# Patient Record
Sex: Female | Born: 1955
Health system: Southern US, Community
[De-identification: ages and names within clinical notes are randomized; demographics above are authoritative.]

## PROBLEM LIST (undated history)

## (undated) DIAGNOSIS — E7800 Pure hypercholesterolemia, unspecified: Secondary | ICD-10-CM

## (undated) DIAGNOSIS — E039 Hypothyroidism, unspecified: Secondary | ICD-10-CM

## (undated) DIAGNOSIS — G35 Multiple sclerosis: Secondary | ICD-10-CM

## (undated) DIAGNOSIS — E119 Type 2 diabetes mellitus without complications: Secondary | ICD-10-CM

## (undated) DIAGNOSIS — I10 Essential (primary) hypertension: Secondary | ICD-10-CM

## (undated) HISTORY — DX: Morbid (severe) obesity due to excess calories: E66.01

## (undated) HISTORY — DX: Essential (primary) hypertension: I10

## (undated) HISTORY — PX: COLONOSCOPY: SHX174

## (undated) HISTORY — DX: Type 2 diabetes mellitus without complications: E11.9

## (undated) HISTORY — DX: Pure hypercholesterolemia, unspecified: E78.00

## (undated) HISTORY — DX: Multiple sclerosis: G35

---

## 1998-08-26 ENCOUNTER — Other Ambulatory Visit: Admission: RE | Admit: 1998-08-26 | Discharge: 1998-08-26 | Payer: Self-pay | Admitting: Radiology

## 1999-01-11 ENCOUNTER — Encounter: Payer: Self-pay | Admitting: Neurology

## 1999-01-11 ENCOUNTER — Ambulatory Visit (HOSPITAL_COMMUNITY): Admission: RE | Admit: 1999-01-11 | Discharge: 1999-01-11 | Payer: Self-pay | Admitting: Neurology

## 1999-06-20 ENCOUNTER — Encounter: Payer: Self-pay | Admitting: Neurology

## 1999-06-20 ENCOUNTER — Ambulatory Visit (HOSPITAL_COMMUNITY): Admission: RE | Admit: 1999-06-20 | Discharge: 1999-06-20 | Payer: Self-pay | Admitting: Neurology

## 1999-09-13 ENCOUNTER — Emergency Department (HOSPITAL_COMMUNITY): Admission: EM | Admit: 1999-09-13 | Discharge: 1999-09-13 | Payer: Self-pay | Admitting: Emergency Medicine

## 1999-11-21 ENCOUNTER — Other Ambulatory Visit: Admission: RE | Admit: 1999-11-21 | Discharge: 1999-11-21 | Payer: Self-pay | Admitting: Obstetrics and Gynecology

## 2000-11-23 ENCOUNTER — Other Ambulatory Visit: Admission: RE | Admit: 2000-11-23 | Discharge: 2000-11-23 | Payer: Self-pay | Admitting: Obstetrics and Gynecology

## 2001-06-23 ENCOUNTER — Encounter: Admission: RE | Admit: 2001-06-23 | Discharge: 2001-06-23 | Payer: Self-pay | Admitting: Urology

## 2001-06-23 ENCOUNTER — Encounter: Payer: Self-pay | Admitting: Urology

## 2004-12-27 ENCOUNTER — Encounter: Admission: RE | Admit: 2004-12-27 | Discharge: 2004-12-27 | Payer: Self-pay | Admitting: Neurology

## 2005-06-30 ENCOUNTER — Encounter: Admission: RE | Admit: 2005-06-30 | Discharge: 2005-06-30 | Payer: Self-pay | Admitting: Neurology

## 2005-07-22 ENCOUNTER — Encounter: Admission: RE | Admit: 2005-07-22 | Discharge: 2005-10-20 | Payer: Self-pay | Admitting: Family Medicine

## 2005-07-27 ENCOUNTER — Encounter: Admission: RE | Admit: 2005-07-27 | Discharge: 2005-07-27 | Payer: Self-pay | Admitting: Neurology

## 2005-09-23 ENCOUNTER — Encounter: Admission: RE | Admit: 2005-09-23 | Discharge: 2005-09-23 | Payer: Self-pay | Admitting: Family Medicine

## 2005-11-25 ENCOUNTER — Encounter: Admission: RE | Admit: 2005-11-25 | Discharge: 2006-02-23 | Payer: Self-pay | Admitting: Family Medicine

## 2007-08-27 HISTORY — PX: CHOLECYSTECTOMY: SHX55

## 2007-09-06 ENCOUNTER — Inpatient Hospital Stay (HOSPITAL_COMMUNITY): Admission: EM | Admit: 2007-09-06 | Discharge: 2007-09-11 | Payer: Self-pay | Admitting: Emergency Medicine

## 2007-09-07 ENCOUNTER — Encounter (INDEPENDENT_AMBULATORY_CARE_PROVIDER_SITE_OTHER): Payer: Self-pay | Admitting: Internal Medicine

## 2007-09-07 ENCOUNTER — Ambulatory Visit: Payer: Self-pay | Admitting: Vascular Surgery

## 2007-09-09 ENCOUNTER — Encounter (INDEPENDENT_AMBULATORY_CARE_PROVIDER_SITE_OTHER): Payer: Self-pay | Admitting: Surgery

## 2007-09-19 ENCOUNTER — Encounter (HOSPITAL_COMMUNITY): Admission: RE | Admit: 2007-09-19 | Discharge: 2007-10-19 | Payer: Self-pay | Admitting: Internal Medicine

## 2009-08-26 HISTORY — PX: LEFT OOPHORECTOMY: SHX1961

## 2009-09-04 ENCOUNTER — Ambulatory Visit (HOSPITAL_COMMUNITY)
Admission: RE | Admit: 2009-09-04 | Discharge: 2009-09-04 | Payer: Self-pay | Source: Home / Self Care | Admitting: Obstetrics and Gynecology

## 2010-04-11 LAB — BASIC METABOLIC PANEL
BUN: 18 mg/dL (ref 6–23)
CO2: 28 mEq/L (ref 19–32)
Chloride: 104 mEq/L (ref 96–112)
Creatinine, Ser: 0.8 mg/dL (ref 0.4–1.2)

## 2010-04-11 LAB — CBC
MCH: 29 pg (ref 26.0–34.0)
MCHC: 33.3 g/dL (ref 30.0–36.0)
MCV: 87.1 fL (ref 78.0–100.0)
Platelets: 286 10*3/uL (ref 150–400)
RDW: 15.2 % (ref 11.5–15.5)

## 2010-04-11 LAB — GLUCOSE, CAPILLARY

## 2010-06-10 NOTE — Consult Note (Signed)
[name], [name]                 ACCOUNT NO.:  [id number]   MEDICAL RECORD NO.:  [id number]          PATIENT TYPE:  INP   LOCATION:  1441                         FACILITY:  [facility]   PHYSICIAN:  Sandria Bales. Ezzard Standing, M.D.  DATE OF BIRTH:  [birth year]   DATE OF CONSULTATION:  09/08/2007  DATE OF DISCHARGE:                                 CONSULTATION   Date of consultation ??   REFERRING PHYSICIAN:  Corinna L. Lendell [name], MD   REASON FOR CONSULTATION:  Gallstone pancreatitis.   HISTORY OF PRESENT ILLNESS:  This is a 55 year old white female who sees  Dr. Shaune Pollack as her primary care doctor.  She has been having some  trouble with urinary tract infections and was being followed by Dr. Alexis Frock.  Apparently, she has some type of neurogenic bladder  secondary to her multiple sclerosis.  She saw him about 2 weeks ago and  was placed on some antibiotics.  She came to see him on Tuesday, September 06, 2007, when she had an episode of hypotension, she was weak, she  could not sit up and was taken to the [facility] emergency room.   I think the initial question was whether this was a cardiac event.  However, in her evaluation, she was found to have an amylase of 1228,  with elevated liver functions and her admitting diagnosis was  pancreatitis.   She also bumped up her creatinine a little bit.  She was placed on IV  fluids.  Continued on her Macrobid as an antibiotic and trimethoprim.  She rapidly got better.  She is now on her second day of  hospitalization.  Her amylase has returned to normal at 65, her lipase  is 48.  Her alkaline phosphatase has improved to 287, with a normal  bilirubin of 1.0.  She had an ultrasound today that showed a contracted  gallbladder full of gallstones, and I was consulted for consideration of  cholecystectomy.   [name] has no prior history of peptic ulcer disease, liver disease.  She had no known gallbladder disease before this.  She has had no  pancreatic disease.  She has had no colon disease.  She has no abdominal  surgery, but she has a scar below her umbilicus for what cause she does  not know.   ALLERGIES:  PENICILLIN.   CURRENT MEDICATIONS:  1. Baclofen 10 mg 2 in the morning and 1 at night.  2. Betaseron 1 mg subcutaneous every other day.  3. Interferon 1B.  4. Flomax 0.4 mg daily.  5. Vesicare 10 mg daily.  6. Imipramine 50 mg at night.  7. Lipitor 40 mg nightly.  8. Metformin 2 gm at dinner.  9. Metoprolol 12.5 mg b.i.d.  10.Micardis 20 mg daily.  11.Nitrofurantoin.   REVIEW OF SYSTEMS:  NEUROLOGIC:  She has been diagnosed with multiple  sclerosis for about 15 years and is followed by Dr. Gustavus Messing.  [place].  She was doing well until recently when apparently she had to  quit her job because of her progressive multiple sclerosis.  She does  have to use a cane to walk, though actually while she has been in the  hospital she has not been doing much walking at all.  I did speak to Dr.  Orlin Hilding about her.  PULMONARY:  No history of pneumonia or tuberculosis.  Does not smoke  cigarettes.  CARDIAC:  She has been hypertensive for maybe for 4-5 years.  She is on  two antihypertensive medicines.  She has no cardiac catheterization, no  cardiac event.  She has had no catheterization.  GASTROINTESTINAL:  See  history of present illness.  UROLOGIC:  She has had trouble with her bladder for at least 10 years,  mainly incontinence with her bladder related to multiple sclerosis,  followed by Dr. Alexis Frock every 6 months.  GYN:  She has no children.   She is not married, lives by herself.  Gardner Candle, her sister is one of  the PA's who works for radiology.  She has some insurance issues where  her Jeannette Corpus runs out in the end of September.  She will have 2 months  before she can then be I think on Medicare coverage.   PHYSICAL EXAMINATION:  VITAL SIGNS:  Her temperature is 98.0, pulse 72,  respirations 24, blood  pressure 132/74.  HEENT:  Unremarkable.  NECK:  Supple.  I feel no mass, no thyromegaly.  LUNGS:  Clear to auscultation with symmetric breath sounds.  HEART:  Regular rate and rhythm without murmur or rub.  BREASTS:  I did not examine her breasts.  ABDOMEN:  Her abdomen is soft.  She is moderately obese.  She has a scar  below her umbilicus, but she has no tenderness, no guarding, no rebound.  But she says she does have some neurologic changes from her MS, though  she is not sure how well that projects as far as pain.  EXTREMITIES:  She moves all extremities, though again she is weak and  needs assistance I guess to ambulate.   LABORATORY DATA:  That I have; again she had a glucose of 102.  Her  sodium is 141, potassium 4.0, chloride of 112, CO2 of 23, creatinine is  0.77 with a BUN of 7.  Her alkaline phosphatase is 287.  SGOT 62, SGPT  184, total protein 5.8, albumin 2.8, amylase is 65, lipase is 48.  Triglycerides were 77.  Her hemoglobin is 10.1, hematocrit 30.7, white  blood count of 5000.   IMPRESSION:  1. Gallstone pancreatitis which has resolved.  I have spoken to her      about the options for treatment, but I think she would be best      served with surgery.  I am off tomorrow, Dr. Gerrit Friends, I think, is on-      call for our service tomorrow and I will speak to him.  If he can      do her surgery tomorrow that would be great.  If he cannot, then I      can schedule her to be done in 2-4 weeks, and certainly I think      will cover the time line for her [employer] coverage.   I discussed with her the indications and potential complications of  gallbladder surgery.  Potential complications include, but not limited  to bleeding, infection, the possibility of open surgery, possibility of  common bile duct injury.  I also discussed that she probably did pass a  stone down her common bile duct.  If she  had her stones in her common  bile duct, there would be a chance she would need a  second procedure  such as an ERCP to clear her common bile duct.  I think she understands  both the possibility of surgery either tomorrow or on a more delayed  basis and the risks of this surgery.   1. Multiple sclerosis x15 years, followed by Dr. Gustavus Messing. We[place]ann.      I spoke with [name] by phone and she thought there were no      issues from a multiple sclerosis standpoint that would preclude her      surgery.  2. History of urinary tract infection.  Her urine culture on September 07, 2007, looks like it is still pending at this time.  3. Diabetes mellitus, stable.  4. Neurogenic bladder, followed by Dr. Alexis Frock.  5. Hypertension, stable.  6. Hypercholesterolemia.  7. She had a mildly elevated creatinine when she first came in, this      has now returned to normal.      Sandria Bales. Ezzard Standing, M.D.  Electronically Signed     DHN/MEDQ  D:  09/08/2007  T:  09/08/2007  Job:  [id number]   cc:   Duncan Dull, M.D.  Fax: [phone]   Corinna L. Lendell [name], MD   Gustavus Messing Orlin Hilding, M.D.  Fax: [phone]   Sigmund I. Patsi Sears, M.D.  Fax: [phone]

## 2010-06-10 NOTE — Discharge Summary (Signed)
[name], [name]                 ACCOUNT NO.:  [id number]   MEDICAL RECORD NO.:  [id number]          PATIENT TYPE:  INP   LOCATION:  1441                         [facility]ACILITY:  [facility]   PHYSICIAN:  Corinna L. Lendell [name], [name]:  [birth year]   DATE OF ADMISSION:  09/06/2007  DATE OF DISCHARGE:  09/11/2007                               DISCHARGE SUMMARY   DISCHARGE DIAGNOSES:  1. Hypotension.  2. Pancreatitis, most likely biliary, resolved.  3. Diabetes.  4. Acute renal insufficiency.  5. Elevated liver function tests, most likely secondary to a passed      stone, improved.  6. Multiple sclerosis.  7. Recurrent urinary tract infections.  8. Cholelithiasis.  9. Status post laparoscopic cholecystectomy on September 09, 2007 by Dr.      Gerrit Friends.  10.Deconditioning.   DISCHARGE MEDICATIONS:  1. Percocet 1 to 2 p.o. q.4 hours p.r.n. pain.  2. Phenergan 25 mg p.o. q.6 hours p.r.n. nausea.   She may resume her outpatient medications which include:  1. Provigil 200 mg daily if needed.  2. Betaseron injection every other day.  3. Trimethoprim sulfamethoxazole 100 mg every other day.  4. Toprol XL 12.5 mg p.o. daily.  5. Actos 15 mg a day.  6. Vesicare 10 mg a day.  7. Flomax 0.4 mg a day.  8. Amaryl 4 mg a day.  9. Micardis 20 mg a day.  10.Metformin ER 1000 mg daily.  11.Baclofen 10 mg 2 in the morning, 1 in the evening.  12.Imipramine 25 mg nightly.  13.Lipitor 40 mg a day.  14.Nitrofurantoin 100 mg p.o. every other day.   CONDITION:  Stable.   ACTIVITY:  No heavy lifting for 3 weeks and walk with walker.  She is  being referred to outpatient physical therapy 3 times a week.   DIET:  Should be low salt diabetic.   PROCEDURES:  As above.   CONSULTATIONS:  Dr. Ovidio Kin.   LABS:  CBC on admission, normal.  At discharge her hemoglobin is 9.9.  PT/PTT unremarkable.  Complete metabolic panel on admission significant  for a glucose 146, creatinine of 1.66, alkaline  phosphatase of 384, SGOT  of 251, SGPT of 427, total bilirubin of 2.2, direct bilirubin of 1.2.  At discharge her creatinine is 0.77, alkaline phosphatase 243, SGOT 34,  SGPT 123, albumin 2.6.  Lipase on admission was 1288.  Two days later  amylase and lipase were normal.  Serial cardiac enzymes negative.  Blood  cultures negative.  Urine culture grew out E. coli, which was sensitive  to ceftriaxone.  The patient had been getting ceftriaxone as an  outpatient.  Triglycerides 77.   SPECIAL STUDIES/RADIOLOGY:  Carotid Dopplers showed no significant  plaque.  Chest x-ray showed nothing acute.  Ultrasound of the abdomen  showed a contracted stone-filled gallbladder, normal-size common bile  duct and no tenderness to transducer palpation.  Slightly prominent  spleen.  Echocardiogram showed ejection fraction of 70 to 75%, a mildly  dilated left atrium, mildly dilated right atrium.  EKG showed normal  sinus rhythm.   HISTORY AND HOSPITAL  COURSE:  [name] is a pleasant 55 year old white  female with multiple medical problems who was being seen in her  urologist's office when she had presyncope and hypotension.  Her blood  pressure was reportedly in the 60s.  When she was seen in the emergency  room her blood pressure was 76/40 and responded well to IV fluids.  She  had no chest pain, no shortness of breath.  She had been getting, I  believe, ceftriaxone injections at Dr. Imelda Pillow office for urinary  tract infection.  She has a history of hypertension and is on  antihypertensives.  She initially vomited but had really only complaints  of a few days' worth of gas pain prior to this.  She had a nontender  abdomen, initially, but upon recheck had some mild epigastric  tenderness.  The patient was placed in step down.  Her anti-  hypertensives were held.  She was started on IV fluids and empiric  Rocephin.  This was later stopped.  Her lipase came back abnormal and an  ultrasound showed no  obstructing stone but cholelithiasis.  The patient  had no further vomiting preoperatively.  I suspect she had her retained  stone, which has passed.  Her lipase quickly improved to normal.  Surgery was consulted and performed laparoscopic cholecystectomy.  There  was some question about possibly tiny retained stone in the common duct  on intraoperative cholangiogram.  However, Dr. Gerrit Friends feels that this  most likely is not a stone and even if it is, it is small enough to  pass spontaneously.  He did not recommend ERCP.  At the time of  discharge, the patient's labs were stable and she has no worsening pain  or vomiting.  She has been tolerating a solid diet.  Dr. Gerrit Friends  recommends repeat LFTs.  Total time on the day of discharge is 40  minutes.      Corinna L. Lendell [name], MD  Electronically Signed     CLS/MEDQ  D:  09/11/2007  T:  09/11/2007  Job:  [id number]   cc:   Duncan Dull, M.D.  Fax: [phone]   Gustavus Messing. Orlin Hilding, M.D.  Fax: [phone]   Sigmund I. Patsi Sears, M.D.  Fax: [phone]   Velora Heckler, MD  [phone] [address] [place]  [place] [address]

## 2010-06-10 NOTE — H&P (Signed)
[name], [name]                 ACCOUNT NO.:  [id number]   MEDICAL RECORD NO.:  [id number]          PATIENT TYPE:  INP   LOCATION:  1441                         FACILITY:  [facility]   PHYSICIAN:  Kela Millin, M.D.DATE OF BIRTH:  [birth year]   DATE OF ADMISSION:  09/06/2007  DATE OF DISCHARGE:                              HISTORY & PHYSICAL   PRIMARY CARE PHYSICIAN:  Dr. Shaune Pollack   CHIEF COMPLAINT:  Presyncope.   HISTORY OF PRESENT ILLNESS:  The patient is a 54 year old white female  with past medical history significant for hypertension, multiple  sclerosis, diabetes mellitus and history of recurrent urinary tract  infections, as well as hypercholesterolemia who presents with the above  complaints.  She reports that she had just completed antibiotic shots  that she was taking at Dr. Imelda Pillow office for a urinary tract  infection this past week and was coming for a follow-up appointment with  him today.  On her way, she felt sick, nauseous and vomited x1.  When  [facility] got [facility], she felt very weak and  decided that she could not make it by herself to the office and so she  called up there and asked for a wheelchair.  She was then assisted up to  Dr. Imelda Pillow office.  As she was trying to get herself up to the  examining table, she felt faint as if she was going to pass out, but no  loss of consciousness.  She reports that the nurse could not get her  blood pressure, per ER physician, it was reported that her systolic  blood pressure was in the 60s, and they decided to bring her to the ER.  She denies chest pain, cough, fevers, dysuria, melena, hematochezia,  palpitations, shortness of breath and no PND.   She was seen in the ER and her initial blood pressure 80/56 reached a  low of 76/40.  She was bolused with IV fluids and the blood pressure  improved to 112/70.  EKG revealed normal sinus rhythm with Q-waves in  lead III, and her  hemoglobin 12.6, creatinine of 1.66 and BUN of 18.  Chest x-ray revealed no active disease.  A urinalysis done while she was  at Dr. Imelda Pillow office per her report was within normal limits.  She  is admitted for further evaluation and management.   PAST MEDICAL HISTORY:  As above.   MEDICATIONS:  1. Baclofen 10 mg 2 in the morning and 1 in p.m.  2. Betaseron 1 mg subcu every other day.  3. Flomax 0.4 mg daily.  4. Imipramine 25 mg 2 p.o. nightly.  5. Lipitor 40 mg nightly.  6. Metformin 500 mg 4 tablets at dinner.  7. Metoprolol 25 mg 1/2 tablet b.i.d.  8. Micardis 20 mg 1 daily.  9. Nitrofurantoin Macrocrystal 100 mg every other day alternating with      trimethoprim 100 mg every other day.  10.Vesicare 10 mg daily.   ALLERGIES:  PENICILLIN.   SOCIAL HISTORY:  She denies tobacco.  She also denies alcohol.  FAMILY HISTORY:  Her father had a MI at age 30 and died at age 71 of  brain aneurysm.  Her cousin has multiple sclerosis.   REVIEW OF SYSTEMS:  As per HPI, other review of systems negative.   PHYSICAL EXAMINATION:  GENERAL:  The patient is a pleasant middle aged  white female.  She is alert and appropriate in no apparent distress.  VITAL SIGNS:  Temperature is 97.7.  Her blood pressure is 112/70 from a  low of 76/40, pulse 71.  O2 sat is 99%.  Respiratory rate is 18.  HEENT:  PERRL, EOMI, slightly dry mucous membranes.  No oral exudates.  NECK:  Supple, no adenopathy and no thyromegaly.  LUNGS:  Clear to auscultation bilaterally.  No crackles or wheezes.  CARDIOVASCULAR:  Regular rate and rhythm.  Normal S1-S2.  ABDOMEN:  Soft, bowel sounds present, nontender, nondistended.  No  organomegaly and no masses palpable.  EXTREMITIES:  No cyanosis and no edema.  NEURO:  She is alert and oriented x3.  Cranial nerves II-XII grossly  intact.  Nonfocal exam.   LABORATORY DATA:  Her white cell count is 10.2 with a hemoglobin of  12.6, hematocrit of 38.6, platelet count of  201,  neutrophil count 87%.  Sodium is 142 with a potassium of 4.4, chloride 106, CO2 of 27, glucose  146, BUN 18, creatinine 1.66, calcium of 9.7, albumin is 3.3, AST is  251.  Chest x-ray shows no active disease.  INR is 1.0, PTT 27.   ASSESSMENT AND PLAN:  1. Hypotension - responding to IV fluids, will continue hydration,      hold all antihypertensives.  We will obtain blood and urine      cultures, cardiac enzymes, follow and recheck hemoglobin.  As      discussed above, she had an episode of nausea and vomiting.  Will      obtain serum lipase and urinalysis as already mentioned.  Follow      and further manage as appropriate.  2. Presyncope - likely secondary to #1, obtain cardiac enzymes, also      carotid Doppler, ultrasound and 2-D echo.  3. Diabetes.  Monitor Accu-Cheks, cover with sliding scale.  Hold      metformin for now.  4. Acute renal failure - likely secondary to #1.  Hydrate, follow and      recheck.  5. Elevated liver function tests - ? shock liver, obtain hepatitis      panel.  6. Multiple sclerosis - continue outpatient medications.      Kela Millin, M.D.  Electronically Signed     ACV/MEDQ  D:  09/07/2007  T:  09/07/2007  Job:  [id number]   cc:   Duncan Dull, M.D.  Fax: [phone]   Sigmund I. Patsi Sears, M.D.  Fax: [phone]

## 2010-06-10 NOTE — Op Note (Signed)
[name], [name]                 ACCOUNT NO.:  [id number]   MEDICAL RECORD NO.:  [id number]          PATIENT TYPE:  INP   LOCATION:  1441                         FACILITY:  [facility]   PHYSICIAN:  Velora Heckler, MD      DATE OF BIRTH:  06-27-1955   DATE OF PROCEDURE:  09/09/2007  DATE OF DISCHARGE:                               OPERATIVE REPORT   PREOPERATIVE DIAGNOSES:  1. Biliary pancreatitis.  2. Cholelithiasis.   POSTOPERATIVE DIAGNOSIS:  1. Biliary pancreatitis.  2. Cholelithiasis.  3. Chronic cholecystitis.   PROCEDURE:  Laparoscopic cholecystectomy with intraoperative  cholangiography.   SURGEON:  Velora Heckler, M.D., FACS   ASSISTANT:  Verline Lema, M.D., FACS   ANESTHESIA:  General per Dr. Sherrian Divers, M.D.   ESTIMATED BLOOD LOSS:  Minimal.   PREPARATION:  Betadine.   COMPLICATIONS:  None.   INDICATIONS:  The patient is a 55 year old white female admitted on the  Medical service with abdominal pain.  Workup revealed findings of  biliary pancreatitis.  Laboratory studies improved as the patient also  improved clinically.  Ultrasound demonstrated multiple gallstones.  General Surgery was called to evaluate the patient was seen in  consultation by Dr. Ovidio Kin on September 08, 2007.  The patient  continued to remain clinically stable and was prepared brought to the  operating room at this time on September 09, 2007 for cholecystectomy and  intraoperative cholangiography.   DESCRIPTION OF PROCEDURE:  Procedure is done in OR #11 at [facility].  The patient is brought to the operating room and  placed in supine position on the operating room table.  Following  administration of general anesthesia, the patient is positioned and then  prepped and draped in the usual strict aseptic fashion.  After  ascertaining that an adequate level of anesthesia had been achieved, an  infraumbilical incision is made at the midline with a #15 blade.  Dissection   is carried through subcutaneous tissues to the fascia.  The  fascia is then incised in the midline and the peritoneal cavity is  entered cautiously.  Zero Vicryl pursestring suture is placed in the  fascia.  A Hasson cannula is introduced under direct vision and secured  with a pursestring suture.  The abdomen  is insufflated of carbon  dioxide.  The laparoscope is introduced and the abdomen explored.  Operative ports are placed along the right costal margin in the midline,  midclavicular line, and anterior axillary line.  Fundus of the  gallbladder is grasped with some difficulty as the gallbladder is  exceedingly thick-walled and essentially the entire gallbladder lumen is  filled with stones.  The gallbladder is grasped and retracted cephalad.  Adhesions between the omentum and the duodenum to the gallbladder are  taken down gently and hemostasis obtained with the electrocautery.  Dissection is carried down to the neck of the gallbladder.  The neck of  the gallbladder is markedly shortened into the porta hepatis  with some  difficulty.  This is gently dissected out.  The cystic duct is dissected  out  and isolated at its junction with the gallbladder wall.  A clip was  placed across the neck of the gallbladder.  The cystic duct is incised  and clear yellow bile emanates from the cystic duct.  A Cook  cholangiography catheter is inserted through a stab wound in the right  upper quadrant.  It is advanced into the cystic duct and secured with a  ligature clip.  Using C-arm fluoroscopy, real-time cholangiography is  performed.  There is rapid filling of the normal caliber biliary tree.  There is reflux of contrast into the right and left hepatic ductal  systems.  There is free flow of contrast distally into the duodenum.  There did not appear to be any obvious mobile filling defects or  evidence of obstruction on intraoperative cholangiography.  This will be  over read by the  radiologist.   The clip is withdrawn and Cook catheter is removed from the peritoneal  cavity.  The cystic duct is triply clipped and divided.  The gallbladder  is then mobilized gently with blunt dissection.  The branches of the  cystic artery are identified, clipped with ligature clips, and divided  with the electrocautery.  With great difficulty, the neck and body of  the gallbladder are excised from the gallbladder bed.  There is  essentially no mesentery.  The gallbladder is exceedingly thick-walled  and almost rigid.  However, it is finally manipulated free from the  gallbladder bed and hemostasis obtained with the electrocautery.  The  entire gallbladder is excised.  It was placed into an EndoCatch bag and  withdrawn through the umbilical port after widening the fascial  incision.  Upon opening, the gallbladder is filled with small stones.   The right upper quadrant is copiously irrigated with warm saline.  Good  hemostasis is noted throughout.  Fluid is evacuated.  Pneumoperitoneum  is released and ports are removed under direct vision.  Zero Vicryl  pursestring sutures are tied securely.  The port sites are anesthetized  with local anesthetic.  The wounds are closed with interrupted 4-0  Vicryl subcuticular sutures.  The wounds are washed and dried and  Benzoin Steri-Strips are applied.  Sterile dressings are applied.  The  patient is awakened from anesthesia and brought to the recovery room in  stable condition.  The patient tolerated the procedure well.      Velora Heckler, MD  Electronically Signed     TMG/MEDQ  D:  09/09/2007  T:  09/09/2007  Job:  [id number]   cc:   Duncan Dull, M.D.  Fax: [phone]   Sigmund I. Patsi Sears, M.D.  Fax: [phone]   Gustavus Messing. Orlin Hilding, M.D.  Fax: [phone]   Lendell [name], MD Cammie Mcgee

## 2010-07-14 ENCOUNTER — Other Ambulatory Visit: Payer: Self-pay | Admitting: Obstetrics and Gynecology

## 2010-10-24 LAB — DIFFERENTIAL
Basophils Absolute: 0
Basophils Relative: 0
Basophils Relative: 1
Eosinophils Absolute: 0
Eosinophils Absolute: 0.2
Monocytes Absolute: 0.4
Monocytes Absolute: 0.7
Monocytes Relative: 8
Neutro Abs: 3
Neutrophils Relative %: 60
Neutrophils Relative %: 87 — ABNORMAL HIGH

## 2010-10-24 LAB — CULTURE, BLOOD (ROUTINE X 2)
Culture: NO GROWTH
Culture: NO GROWTH

## 2010-10-24 LAB — COMPREHENSIVE METABOLIC PANEL
ALT: 123 — ABNORMAL HIGH
ALT: 184 — ABNORMAL HIGH
ALT: 265 — ABNORMAL HIGH
Albumin: 2.6 — ABNORMAL LOW
Albumin: 3.3 — ABNORMAL LOW
Alkaline Phosphatase: 287 — ABNORMAL HIGH
Alkaline Phosphatase: 303 — ABNORMAL HIGH
Alkaline Phosphatase: 384 — ABNORMAL HIGH
BUN: 14
BUN: 18
BUN: 7
CO2: 23
Calcium: 8.3 — ABNORMAL LOW
Chloride: 106
Chloride: 113 — ABNORMAL HIGH
GFR calc Af Amer: >60
GFR calc non Af Amer: >60
Glucose, Bld: 120 — ABNORMAL HIGH
Glucose, Bld: 146 — ABNORMAL HIGH
Glucose, Bld: 162 — ABNORMAL HIGH
Glucose, Bld: 187 — ABNORMAL HIGH
Potassium: 3.8
Potassium: 4
Potassium: 4.4
Sodium: 139
Sodium: 141
Sodium: 141
Total Bilirubin: 1
Total Bilirubin: 2.6 — ABNORMAL HIGH
Total Bilirubin: 4.8 — ABNORMAL HIGH
Total Protein: 5.5 — ABNORMAL LOW
Total Protein: 5.7 — ABNORMAL LOW

## 2010-10-24 LAB — CBC
HCT: 38.6
Hemoglobin: 12.6
Platelets: 201
Platelets: 209
RBC: 3.44 — ABNORMAL LOW
WBC: 10.2
WBC: 5

## 2010-10-24 LAB — GLUCOSE, CAPILLARY
Glucose-Capillary: 130 — ABNORMAL HIGH
Glucose-Capillary: 141 — ABNORMAL HIGH
Glucose-Capillary: 148 — ABNORMAL HIGH
Glucose-Capillary: 156 — ABNORMAL HIGH

## 2010-10-24 LAB — URINE CULTURE

## 2010-10-24 LAB — HEPATIC FUNCTION PANEL
ALT: 328 — ABNORMAL HIGH
AST: 182 — ABNORMAL HIGH
Alkaline Phosphatase: 344 — ABNORMAL HIGH
Bilirubin, Direct: 2.2 — ABNORMAL HIGH
Total Bilirubin: 3.4 — ABNORMAL HIGH

## 2010-10-24 LAB — PROTIME-INR: INR: 1

## 2010-10-24 LAB — HEMOGLOBIN AND HEMATOCRIT, BLOOD
HCT: 30.6 — ABNORMAL LOW
Hemoglobin: 10.1 — ABNORMAL LOW

## 2010-10-24 LAB — CARDIAC PANEL(CRET KIN+CKTOT+MB+TROPI)
Relative Index: INVALID
Relative Index: INVALID
Total CK: 47
Troponin I: 0.01

## 2010-10-24 LAB — CK TOTAL AND CKMB (NOT AT ARMC): Total CK: 41

## 2011-02-11 DIAGNOSIS — G35 Multiple sclerosis: Secondary | ICD-10-CM | POA: Diagnosis not present

## 2011-02-11 DIAGNOSIS — R269 Unspecified abnormalities of gait and mobility: Secondary | ICD-10-CM | POA: Diagnosis not present

## 2011-03-30 DIAGNOSIS — I10 Essential (primary) hypertension: Secondary | ICD-10-CM | POA: Diagnosis not present

## 2011-03-30 DIAGNOSIS — E7800 Pure hypercholesterolemia, unspecified: Secondary | ICD-10-CM | POA: Diagnosis not present

## 2011-03-30 DIAGNOSIS — R197 Diarrhea, unspecified: Secondary | ICD-10-CM | POA: Diagnosis not present

## 2011-06-29 DIAGNOSIS — I10 Essential (primary) hypertension: Secondary | ICD-10-CM | POA: Diagnosis not present

## 2011-06-29 DIAGNOSIS — E7800 Pure hypercholesterolemia, unspecified: Secondary | ICD-10-CM | POA: Diagnosis not present

## 2011-07-01 DIAGNOSIS — H5210 Myopia, unspecified eye: Secondary | ICD-10-CM | POA: Diagnosis not present

## 2011-07-01 DIAGNOSIS — G35 Multiple sclerosis: Secondary | ICD-10-CM | POA: Diagnosis not present

## 2011-07-01 DIAGNOSIS — H52229 Regular astigmatism, unspecified eye: Secondary | ICD-10-CM | POA: Diagnosis not present

## 2011-07-01 DIAGNOSIS — H4720 Unspecified optic atrophy: Secondary | ICD-10-CM | POA: Diagnosis not present

## 2011-07-13 DIAGNOSIS — N3000 Acute cystitis without hematuria: Secondary | ICD-10-CM | POA: Diagnosis not present

## 2011-07-13 DIAGNOSIS — N3020 Other chronic cystitis without hematuria: Secondary | ICD-10-CM | POA: Diagnosis not present

## 2011-07-13 DIAGNOSIS — N312 Flaccid neuropathic bladder, not elsewhere classified: Secondary | ICD-10-CM | POA: Diagnosis not present

## 2011-08-20 DIAGNOSIS — G35 Multiple sclerosis: Secondary | ICD-10-CM | POA: Diagnosis not present

## 2011-08-20 DIAGNOSIS — R269 Unspecified abnormalities of gait and mobility: Secondary | ICD-10-CM | POA: Diagnosis not present

## 2011-09-07 DIAGNOSIS — G35 Multiple sclerosis: Secondary | ICD-10-CM | POA: Diagnosis not present

## 2011-09-07 DIAGNOSIS — R269 Unspecified abnormalities of gait and mobility: Secondary | ICD-10-CM | POA: Diagnosis not present

## 2011-10-01 DIAGNOSIS — G35 Multiple sclerosis: Secondary | ICD-10-CM | POA: Diagnosis not present

## 2011-10-01 DIAGNOSIS — E782 Mixed hyperlipidemia: Secondary | ICD-10-CM | POA: Diagnosis not present

## 2011-10-01 DIAGNOSIS — I10 Essential (primary) hypertension: Secondary | ICD-10-CM | POA: Diagnosis not present

## 2011-10-01 DIAGNOSIS — Z23 Encounter for immunization: Secondary | ICD-10-CM | POA: Diagnosis not present

## 2011-10-07 DIAGNOSIS — R269 Unspecified abnormalities of gait and mobility: Secondary | ICD-10-CM | POA: Diagnosis not present

## 2011-10-07 DIAGNOSIS — G35 Multiple sclerosis: Secondary | ICD-10-CM | POA: Diagnosis not present

## 2011-12-31 DIAGNOSIS — E782 Mixed hyperlipidemia: Secondary | ICD-10-CM | POA: Diagnosis not present

## 2011-12-31 DIAGNOSIS — I10 Essential (primary) hypertension: Secondary | ICD-10-CM | POA: Diagnosis not present

## 2012-01-04 DIAGNOSIS — G35 Multiple sclerosis: Secondary | ICD-10-CM | POA: Diagnosis not present

## 2012-01-04 DIAGNOSIS — R339 Retention of urine, unspecified: Secondary | ICD-10-CM | POA: Diagnosis not present

## 2012-01-04 DIAGNOSIS — N312 Flaccid neuropathic bladder, not elsewhere classified: Secondary | ICD-10-CM | POA: Diagnosis not present

## 2012-01-04 DIAGNOSIS — N3941 Urge incontinence: Secondary | ICD-10-CM | POA: Diagnosis not present

## 2012-01-09 ENCOUNTER — Encounter: Payer: Self-pay | Admitting: Neurology

## 2012-01-09 DIAGNOSIS — G35 Multiple sclerosis: Secondary | ICD-10-CM

## 2012-01-09 DIAGNOSIS — R269 Unspecified abnormalities of gait and mobility: Secondary | ICD-10-CM

## 2012-01-11 DIAGNOSIS — R310 Gross hematuria: Secondary | ICD-10-CM | POA: Diagnosis not present

## 2012-01-11 DIAGNOSIS — G35 Multiple sclerosis: Secondary | ICD-10-CM | POA: Diagnosis not present

## 2012-01-11 DIAGNOSIS — N3020 Other chronic cystitis without hematuria: Secondary | ICD-10-CM | POA: Diagnosis not present

## 2012-01-11 DIAGNOSIS — N312 Flaccid neuropathic bladder, not elsewhere classified: Secondary | ICD-10-CM | POA: Diagnosis not present

## 2012-01-14 DIAGNOSIS — G35 Multiple sclerosis: Secondary | ICD-10-CM | POA: Diagnosis not present

## 2012-01-14 DIAGNOSIS — R269 Unspecified abnormalities of gait and mobility: Secondary | ICD-10-CM | POA: Diagnosis not present

## 2012-01-29 ENCOUNTER — Ambulatory Visit (HOSPITAL_COMMUNITY)
Admission: RE | Admit: 2012-01-29 | Discharge: 2012-01-29 | Disposition: A | Discharge status: Home / Self Care | Payer: Medicare Other | Source: Ambulatory Visit | Attending: Neurology | Admitting: Neurology

## 2012-01-29 DIAGNOSIS — IMO0001 Reserved for inherently not codable concepts without codable children: Secondary | ICD-10-CM | POA: Insufficient documentation

## 2012-01-29 DIAGNOSIS — Z9181 History of falling: Secondary | ICD-10-CM | POA: Diagnosis not present

## 2012-01-29 DIAGNOSIS — R269 Unspecified abnormalities of gait and mobility: Secondary | ICD-10-CM | POA: Insufficient documentation

## 2012-01-29 DIAGNOSIS — M6281 Muscle weakness (generalized): Secondary | ICD-10-CM | POA: Diagnosis not present

## 2012-01-29 DIAGNOSIS — R262 Difficulty in walking, not elsewhere classified: Secondary | ICD-10-CM | POA: Insufficient documentation

## 2012-01-29 DIAGNOSIS — R29898 Other symptoms and signs involving the musculoskeletal system: Secondary | ICD-10-CM | POA: Insufficient documentation

## 2012-01-29 NOTE — Evaluation (Signed)
Physical Therapy Evaluation  Patient Details  Name: [name] MRN: [id number] Date of Birth: September 05, 1955  Today's Date: 01/29/2012 Time: 1025-1108 PT Time Calculation (min): 43 min  Visit#: 1  of 12   Re-eval: 02/28/12 Assessment Diagnosis: hx of falling Next MD Visit: 03/04/2012  Authorization: medicare  Authorization Time Period:    Authorization Visit#: 1  of 19    Past Medical History:  Past Medical History  Diagnosis Date  . Diabetes mellitus without complication   . Hypertension   . Morbid obesity   . High cholesterol   . MS (multiple sclerosis) relapsing remitting   Past Surgical History:  Past Surgical History  Procedure Date  . Cholecystectomy   . Left oophorectomy     Subjective Symptoms/Limitations Symptoms: Ms. Jezewski has been diagnosed with MS for 15 yrs.  She has been using a rolling walker for about two months now due to being weaker.  She had used a cane in the past for about five years.  She has started to fall and therefore she spoke to her MD who has referred her to therapy for evealuation and treatment.  How long can you sit comfortably?: no problem  How long can you stand comfortably?: Able to stand for 10 minutes. How long can you walk comfortably?: Able to walk with her walker for about five minutes. Pain Assessment Currently in Pain?:  (Pt states that her legs it is more numbness)  Precautions/Restrictions     Prior Functioning  Home Living Lives With: Alone Type of Home: Apartment  Cognition/Observation Cognition Overall Cognitive Status: Appears within functional limits for tasks assessed  Sensation/Coordination/Flexibility/Functional Tests Functional Tests Functional Tests: sit to stand no hands 6/ 30 sec.  Assessment RLE Strength Right Hip Flexion: 2+/5 Right Hip Extension: 3+/5 Right Hip ABduction: 2/5 Right Knee Flexion: 3/5 Right Knee Extension: 3/5 Right Ankle Dorsiflexion: 3+/5 LLE Strength Left Hip Flexion:  3-/5 Left Hip Extension: 3/5 Left Hip ABduction: 2+/5 Left Knee Flexion: 3+/5 Left Knee Extension: 3+/5 Left Ankle Dorsiflexion: 3+/5  Exercise/Treatments Mobility/Balance  Berg Balance Test Sit to Stand: Able to stand without using hands and stabilize independently Standing Unsupported: Able to stand 2 minutes with supervision Sitting with Back Unsupported but Feet Supported on Floor or Stool: Able to sit safely and securely 2 minutes Stand to Sit: Controls descent by using hands Transfers: Able to transfer safely, definite need of hands Standing Unsupported with Eyes Closed: Able to stand 3 seconds Standing Ubsupported with Feet Together: Needs help to attain position but able to stand for 30 seconds with feet together From Standing, Reach Forward with Outstretched Arm: Can reach forward >12 cm safely (5") From Standing Position, Pick up Object from Floor: Able to pick up shoe safely and easily From Standing Position, Turn to Look Behind Over each Shoulder: Turn sideways only but maintains balance Turn 360 Degrees: Able to turn 360 degrees safely but slowly Standing Unsupported, Alternately Place Feet on Step/Stool: Needs assistance to keep from falling or unable to try Standing Unsupported, One Foot in Front: Loses balance while stepping or standing Standing on One Leg: Tries to lift leg/unable to hold 3 seconds but remains standing independently Total Score: 32      Seated Other Seated Exercises: HEP ankle DF/PF/glut sets/bridge/ bent knee raise/hip abduction x10   Physical Therapy Assessment and Plan PT Assessment and Plan Clinical Impression Statement: Pt with history of MS who has been declining in strength and balance resulting in falls who will benefit from  skilled PT to improve safety and maximize pt functioning level. Pt will benefit from skilled therapeutic intervention in order to improve on the following deficits: Abnormal gait;Decreased activity tolerance;Decreased  balance;Difficulty walking;Decreased strength Rehab Potential: Good PT Frequency: Min 3X/week PT Duration: 4 weeks PT Treatment/Interventions: Gait training;Therapeutic activities;Therapeutic exercise;Patient/family education PT Plan: Begin bike,  balance activies; SLS; heel raises; tandem stance, tandem gt; retro gt side stepping; marching in place..    Goals Home Exercise Program Pt will Perform Home Exercise Program: Independently PT Short Term Goals Time to Complete Short Term Goals: 2 weeks PT Short Term Goal 1: Pt strength to be improved 1/2 grade for patient to feel confident going sit to stand without using her hands. PT Long Term Goals Time to Complete Long Term Goals: 4 weeks PT Long Term Goal 1: Pt strength to be increased one grade to allow pt to be able to get up off the floor by herself PT Long Term Goal 2: Pt to report no falls in the past two weeks Long Term Goal 3: Berg score to be improved by 10 points to allow pt to feel comfortable ambulating in the house with a cane.  Problem List Patient Active Problem List  Diagnosis  . Abnormality of gait  . Multiple sclerosis  . History of falling  . Bilateral leg weakness  . Difficulty walking    PT - End of Session Equipment Utilized During Treatment: Gait belt Activity Tolerance: Patient tolerated treatment well General Behavior During Session: [facility] for tasks performed Cognition: [facility] for tasks performed PT Plan of Care PT Home Exercise Plan: given for strengthening.  GP Functional Assessment Tool Used: ABC Functional Limitation: Mobility: Walking and moving around Mobility: Walking and Moving Around Current Status (O1308): At least 40 percent but less than 60 percent impaired, limited or restricted Mobility: Walking and Moving Around Goal Status [phone]): At least 20 percent but less than 40 percent impaired, limited or restricted  RUSSELL,CINDY 01/29/2012, 12:21 PM  Physician Documentation Your signature is  required to indicate approval of the treatment plan as stated above.  Please sign and either send electronically or make a copy of this report for your files and return this physician signed original.   Please mark one 1.__approve of plan  2. ___approve of plan with the following conditions.   ______________________________                                                          _____________________ Physician Signature                                                                                                             Date

## 2012-02-01 ENCOUNTER — Ambulatory Visit (HOSPITAL_COMMUNITY)
Admission: RE | Admit: 2012-02-01 | Discharge: 2012-02-01 | Disposition: A | Discharge status: Home / Self Care | Payer: Medicare Other | Source: Ambulatory Visit | Attending: Neurology | Admitting: Neurology

## 2012-02-01 NOTE — Progress Notes (Signed)
Physical Therapy Treatment Patient Details  Name: [name] MRN: [id number] Date of Birth: 1955-12-24  Today's Date: 02/01/2012 Time: 1520-1600 PT Time Calculation (min): 40 min  Visit#: 2  of 12   Re-eval: 02/28/12 Charges: Gait x 8' NMR x 30'  Authorization: medicare  Authorization Visit#: 2  of 19    Subjective: Symptoms/Limitations Symptoms: Pt reports no pain only weakness.   Exercise/Treatments Balance Exercises Standing Tandem Stance: 2 reps;30 secs;Intermittent HHA Tandem Gait: 1 rep;Forward;Limitations Tandem Gait Limitations: w/1 HHA Sidestepping: 1 rep Heel Raises: 10 reps Toe Raise: 10 reps Other Standing Exercises: Gait training around dept x 8' with vc's for stride length, posture, and hip/knee flexion   Physical Therapy Assessment and Plan PT Assessment and Plan Clinical Impression Statement: Pt requires multiple rest breaks throughout session. Attempted tandem gait but pt was unable to complete activity without 1 HHA. Began gait training with vc's for posture, stride length and hip/knee flexion. Pt reports no increase in pain at end of session. PT Plan: Continue to progress per PT POC. Begin retro gait and SLS next tx.     Problem List Patient Active Problem List  Diagnosis  . Abnormality of gait  . Multiple sclerosis  . History of falling  . Bilateral leg weakness  . Difficulty walking    PT - End of Session Equipment Utilized During Treatment: Gait belt Activity Tolerance: Patient tolerated treatment well General Behavior During Session: [facility] for tasks performed Cognition: [facility] for tasks performed  GP Functional Assessment Tool Used: ABC  Seth Bake, PTA 02/01/2012, 4:31 PM

## 2012-02-03 ENCOUNTER — Ambulatory Visit (HOSPITAL_COMMUNITY)
Admission: RE | Admit: 2012-02-03 | Discharge: 2012-02-03 | Disposition: A | Discharge status: Home / Self Care | Payer: Medicare Other | Source: Ambulatory Visit | Attending: Neurology | Admitting: Neurology

## 2012-02-03 DIAGNOSIS — R269 Unspecified abnormalities of gait and mobility: Secondary | ICD-10-CM | POA: Diagnosis not present

## 2012-02-03 DIAGNOSIS — IMO0001 Reserved for inherently not codable concepts without codable children: Secondary | ICD-10-CM | POA: Diagnosis not present

## 2012-02-03 DIAGNOSIS — M6281 Muscle weakness (generalized): Secondary | ICD-10-CM | POA: Diagnosis not present

## 2012-02-03 DIAGNOSIS — Z9181 History of falling: Secondary | ICD-10-CM | POA: Diagnosis not present

## 2012-02-03 NOTE — Progress Notes (Signed)
Physical Therapy Treatment Patient Details  Name: [name] MRN: [id number] Date of Birth: 08/05/1955  Today's Date: 02/03/2012 Time: 1518-1605 PT Time Calculation (min): 47 min  Visit#: 3  of 12   Re-eval: 02/28/12 Authorization: medicare  Authorization Visit#: 3  of 19   Charges:  NMR 12', gait 8', therex 24'  Subjective: Symptoms/Limitations Symptoms: Pt. states she is doing well; playing phone tag with MD in regards to getting new RW.  No pain. Pain Assessment Currently in Pain?: No/denies   Exercise/Treatments Balance Exercises Standing Tandem Stance: 2 reps;30 secs;Intermittent HHA SLS: Eyes open;Hand held assist (HHA) 1;Solid surface;3 reps Tandem Gait: Forward;Limitations;2 reps Tandem Gait Limitations: w/1 HHA Retro Gait: 1 rep Sidestepping: 1 rep Heel Raises: 15 reps Toe Raise: 15 reps Other Standing Exercises: Gait training around dept 2RT with vc's for stride length, posture, and hip/knee flexion Other Standing Exercises: hip abd, ext 10 reps each  Seated Other Seated Exercises: bike 6'@2 .0, seat 10    Physical Therapy Assessment and Plan PT Assessment and Plan Clinical Impression Statement: Pt. continues to require rest breaks throughout session.  Added standing  hip abduction and extension to increase stabilty.  Pt. unable to maintain SLS without UE assistance.  Sent script to MD for FW walker; to await return to give to patient.  PT Plan: Continue to progress per PT POC. Continue to progress strength, balance and overall stability.     Problem List Patient Active Problem List  Diagnosis  . Abnormality of gait  . Multiple sclerosis  . History of falling  . Bilateral leg weakness  . Difficulty walking    PT - End of Session Equipment Utilized During Treatment: Gait belt Activity Tolerance: Patient tolerated treatment well General Behavior During Session: [facility] for tasks performed Cognition: [facility] for tasks performed   Lurena Nida,  PTA/CLT 02/03/2012, 4:33 PM

## 2012-02-05 ENCOUNTER — Ambulatory Visit (HOSPITAL_COMMUNITY)
Admission: RE | Admit: 2012-02-05 | Discharge: 2012-02-05 | Disposition: A | Discharge status: Home / Self Care | Payer: Medicare Other | Source: Ambulatory Visit | Attending: Neurology | Admitting: Neurology

## 2012-02-05 DIAGNOSIS — R29898 Other symptoms and signs involving the musculoskeletal system: Secondary | ICD-10-CM

## 2012-02-05 DIAGNOSIS — Z9181 History of falling: Secondary | ICD-10-CM | POA: Diagnosis not present

## 2012-02-05 DIAGNOSIS — M6281 Muscle weakness (generalized): Secondary | ICD-10-CM | POA: Diagnosis not present

## 2012-02-05 DIAGNOSIS — R262 Difficulty in walking, not elsewhere classified: Secondary | ICD-10-CM

## 2012-02-05 DIAGNOSIS — IMO0001 Reserved for inherently not codable concepts without codable children: Secondary | ICD-10-CM | POA: Diagnosis not present

## 2012-02-05 DIAGNOSIS — R269 Unspecified abnormalities of gait and mobility: Secondary | ICD-10-CM | POA: Diagnosis not present

## 2012-02-05 NOTE — Progress Notes (Signed)
Physical Therapy Treatment Patient Details  Name: [name] MRN: [id number] Date of Birth: 03/11/1955  Today's Date: 02/05/2012 Time: 1523-1602 PT Time Calculation (min): 39 min Charge: Gt 8; there ex 29 Visit#: 4  of 12   Re-eval: 02/29/12    Authorization: medicare  Authorization Visit#: 4  of 10    Subjective: Symptoms/Limitations Symptoms: Pt states that she is sore.   Exercise/Treatments Balance Exercises Standing Tandem Stance: 2 reps;30 secs;Intermittent HHA SLS: Eyes open;Solid surface;3 reps Sidestepping: 2 reps Toe Raise: 10 reps Other Standing Exercises: Gait training around dept 2RT with vc's for stride length, posture, and hip/knee flexion    Seated Other Seated Exercises: bike 6'@1 .5, seat 10      Physical Therapy Assessment and Plan PT Assessment and Plan Clinical Impression Statement: Pt continues to need constant verbal cuing to ambulate in correct posture.  Pt needs frequent rest breaks throughout treatment.  PT needs verbal cuing to not drag right leg when adducting during side stepping. PT Plan: Add in tandem and retro gait into program next visitl    Goals Home Exercise Program PT Goal: Perform Home Exercise Program - Progress: Met PT Short Term Goals PT Short Term Goal 1 - Progress: Progressing toward goal PT Long Term Goals PT Long Term Goal 1 - Progress: Progressing toward goal PT Long Term Goal 2 - Progress: Progressing toward goal Long Term Goal 3 Progress: Progressing toward goal  Problem List Patient Active Problem List  Diagnosis  . Abnormality of gait  . Multiple sclerosis  . History of falling  . Bilateral leg weakness  . Difficulty walking    PT - End of Session Equipment Utilized During Treatment: Gait belt Activity Tolerance: Treatment limited secondary to medical complications (Comment) (Pt unable to finish whole program secondary to losing control of her bowels.)  GP    [name],[name] 02/05/2012, 4:12 PM

## 2012-02-08 ENCOUNTER — Ambulatory Visit (HOSPITAL_COMMUNITY)
Admission: RE | Admit: 2012-02-08 | Discharge: 2012-02-08 | Disposition: A | Discharge status: Home / Self Care | Payer: Medicare Other | Source: Ambulatory Visit

## 2012-02-08 DIAGNOSIS — Z9181 History of falling: Secondary | ICD-10-CM | POA: Diagnosis not present

## 2012-02-08 DIAGNOSIS — R29898 Other symptoms and signs involving the musculoskeletal system: Secondary | ICD-10-CM

## 2012-02-08 DIAGNOSIS — IMO0001 Reserved for inherently not codable concepts without codable children: Secondary | ICD-10-CM | POA: Diagnosis not present

## 2012-02-08 DIAGNOSIS — M6281 Muscle weakness (generalized): Secondary | ICD-10-CM | POA: Diagnosis not present

## 2012-02-08 DIAGNOSIS — R262 Difficulty in walking, not elsewhere classified: Secondary | ICD-10-CM

## 2012-02-08 DIAGNOSIS — R269 Unspecified abnormalities of gait and mobility: Secondary | ICD-10-CM | POA: Diagnosis not present

## 2012-02-08 NOTE — Progress Notes (Signed)
Physical Therapy Treatment Patient Details  Name: [name] MRN: [id number] Date of Birth: 1955/10/20  Today's Date: 02/08/2012 Time: 1515-1604 PT Time Calculation (min): 49 min  Visit#: 5  of 12   Re-eval: 02/29/12   Charge: there ex x 42' Authorization: medicare  Authorization Visit#: 4  of 10    Subjective: Symptoms/Limitations Symptoms: Pt states she has been doing her execises  Exercise/Treatments  Balance Exercises Standing Tandem Stance: 2 reps;30 secs;Intermittent HHA SLS: Eyes open;Solid surface;3 reps Tandem Gait: Forward;2 reps;Limitations Tandem Gait Limitations: 1 HH Retro Gait: 2 reps Sidestepping: 2 reps Marching: Solid surface;10 reps Heel Raises: 10 reps Toe Raise: 10 reps Sit to Stand: Standard surface;Limitations Sit to Stand Limitations: R LE x 5; L x 5 Other Standing Exercises: hip ab/ex B x 10w/ 3#      Seated Other Seated Exercises: bike 6'@1 .0 w/ B LE tied w/ t-band, seat 10   Physical Therapy Assessment and Plan PT Assessment and Plan Clinical Impression Statement: Pt continues to need verbal cuing to keep COG over BOS; added sit to stand and marching activities.  Tied LE together w/ t-band during bike for better hip control. PT Plan: beging cone rotation on solid surface.  It pt does well may progress to foam next period    Goals Home Exercise Program PT Goal: Perform Home Exercise Program - Progress: Met PT Short Term Goals PT Short Term Goal 1 - Progress: Progressing toward goal PT Long Term Goals PT Long Term Goal 1 - Progress: Progressing toward goal PT Long Term Goal 2 - Progress: Progressing toward goal Long Term Goal 3 Progress: Progressing toward goal  Problem List Patient Active Problem List  Diagnosis  . Abnormality of gait  . Multiple sclerosis  . History of falling  . Bilateral leg weakness  . Difficulty walking    PT - End of Session Equipment Utilized During Treatment: Gait belt Activity Tolerance: Patient  tolerated treatment well General Behavior During Session: [facility] for tasks performed Cognition: [facility] for tasks performed  GP    RUSSELL,CINDY 02/08/2012, 4:18 PM

## 2012-02-10 ENCOUNTER — Ambulatory Visit (HOSPITAL_COMMUNITY)
Admission: RE | Admit: 2012-02-10 | Discharge: 2012-02-10 | Disposition: A | Discharge status: Home / Self Care | Payer: Medicare Other | Source: Ambulatory Visit | Attending: Neurology | Admitting: Neurology

## 2012-02-10 DIAGNOSIS — M6281 Muscle weakness (generalized): Secondary | ICD-10-CM | POA: Diagnosis not present

## 2012-02-10 DIAGNOSIS — R269 Unspecified abnormalities of gait and mobility: Secondary | ICD-10-CM | POA: Diagnosis not present

## 2012-02-10 DIAGNOSIS — IMO0001 Reserved for inherently not codable concepts without codable children: Secondary | ICD-10-CM | POA: Diagnosis not present

## 2012-02-10 DIAGNOSIS — Z9181 History of falling: Secondary | ICD-10-CM | POA: Diagnosis not present

## 2012-02-10 NOTE — Progress Notes (Signed)
Physical Therapy Treatment Patient Details  Name: [name] MRN: [id number] Date of Birth: [birth year]  Today's Date: 02/10/2012 Time: 4098-1191 PT Time Calculation (min): 40 min  Visit#: 6  of 12   Re-eval: 02/29/12 Charges: Therex x 8' NMR x 30'  Authorization: medicare  Authorization Visit#: 5  of 10    Subjective: Symptoms/Limitations Symptoms: Pt states tha she got a new walker. Pain Assessment Currently in Pain?: No/denies   Exercise/Treatments Balance Exercises Standing Tandem Gait: 2 reps Retro Gait: 2 reps Sidestepping: 2 reps Marching: Solid surface;10 reps Heel Raises: 15 reps Toe Raise: 15 reps  Seated Other Seated Exercises: Bike 8'@2 .0 for strengthening and activity tolerance   Physical Therapy Assessment and Plan PT Assessment and Plan Clinical Impression Statement: Pt displays improved hip stability with sidestepping. Pt requires constant cueing to complete true tandem gait. Pt requires frequent rest breaks throughout session. Pt reports 0/10 pain at end of session. PT Plan: Continue per PT POC. Begin cone rotation on solid surface.  It pt does well may progress to foam the following session.     Problem List Patient Active Problem List  Diagnosis  . Abnormality of gait  . Multiple sclerosis  . History of falling  . Bilateral leg weakness  . Difficulty walking    PT - End of Session Equipment Utilized During Treatment: Gait belt Activity Tolerance: Patient tolerated treatment well General Behavior During Session: [facility] for tasks performed Cognition: [facility] for tasks performed  Seth Bake, PTA 02/10/2012, 4:35 PM

## 2012-02-12 ENCOUNTER — Ambulatory Visit (HOSPITAL_COMMUNITY)
Admission: RE | Admit: 2012-02-12 | Discharge: 2012-02-12 | Disposition: A | Discharge status: Home / Self Care | Payer: Medicare Other | Source: Ambulatory Visit | Attending: Neurology | Admitting: Neurology

## 2012-02-12 DIAGNOSIS — M6281 Muscle weakness (generalized): Secondary | ICD-10-CM | POA: Diagnosis not present

## 2012-02-12 DIAGNOSIS — Z9181 History of falling: Secondary | ICD-10-CM | POA: Diagnosis not present

## 2012-02-12 DIAGNOSIS — IMO0001 Reserved for inherently not codable concepts without codable children: Secondary | ICD-10-CM | POA: Diagnosis not present

## 2012-02-12 DIAGNOSIS — R29898 Other symptoms and signs involving the musculoskeletal system: Secondary | ICD-10-CM

## 2012-02-12 DIAGNOSIS — R262 Difficulty in walking, not elsewhere classified: Secondary | ICD-10-CM

## 2012-02-12 DIAGNOSIS — R269 Unspecified abnormalities of gait and mobility: Secondary | ICD-10-CM | POA: Diagnosis not present

## 2012-02-12 NOTE — Progress Notes (Addendum)
Physical Therapy Treatment Patient Details  Name: [name] MRN: [id number] Date of Birth: [birth year]  Today's Date: 02/12/2012 Time: 4098-1191 PT Time Calculation (min): 44 min  Visit#: 7  of 12   Re-eval: 02/29/12  charge:  There ex 41  Authorization: medicare  Authorization Visit#: 6  of 10    Subjective: Symptoms/Limitations Symptoms: Pt states she can tell she is able to walk better.  Pain Assessment Currently in Pain?: No/denies   Exercise/Treatments Balance Exercises Standing Tandem Stance: Eyes open;3 reps SLS: Eyes open;5 reps Tandem Gait: 2 reps Retro Gait: 2 reps Sidestepping: 2 reps Marching: Solid surface;10 reps Toe Raise: 15 reps Sit to Stand: Standard surface Sit to Stand Limitations: x10@ Other Standing Exercises: hip ab/ext x t     Seated Other Seated Exercises: Bike 8'@2 .0 for strengthening and activity tolerance       Physical Therapy Assessment and Plan PT Assessment and Plan Clinical Impression Statement: Pt has less rest breaks and improved form with exercises today. Pt keeping core tight on a more consistent basis PT Plan: begin cone rotarion on solid surface.    Goals Home Exercise Program PT Goal: Perform Home Exercise Program - Progress: Met PT Short Term Goals PT Short Term Goal 1 - Progress: Progressing toward goal PT Long Term Goals PT Long Term Goal 1 - Progress: Progressing toward goal PT Long Term Goal 2 - Progress: Met Long Term Goal 3 Progress: Progressing toward goal  Problem List Patient Active Problem List  Diagnosis  . Abnormality of gait  . Multiple sclerosis  . History of falling  . Bilateral leg weakness  . Difficulty walking    PT - End of Session Equipment Utilized During Treatment: Gait belt Activity Tolerance: Patient tolerated treatment well General Behavior During Session: [facility] for tasks performed Cognition: [facility] for tasks performed  GP    Adwoa Axe,CINDY 02/12/2012, 4:21 PM

## 2012-02-15 ENCOUNTER — Ambulatory Visit (HOSPITAL_COMMUNITY)
Admission: RE | Admit: 2012-02-15 | Discharge: 2012-02-15 | Disposition: A | Discharge status: Home / Self Care | Payer: Medicare Other | Source: Ambulatory Visit | Attending: Neurology | Admitting: Neurology

## 2012-02-15 DIAGNOSIS — R262 Difficulty in walking, not elsewhere classified: Secondary | ICD-10-CM

## 2012-02-15 DIAGNOSIS — Z9181 History of falling: Secondary | ICD-10-CM

## 2012-02-15 DIAGNOSIS — M6281 Muscle weakness (generalized): Secondary | ICD-10-CM | POA: Diagnosis not present

## 2012-02-15 DIAGNOSIS — R29898 Other symptoms and signs involving the musculoskeletal system: Secondary | ICD-10-CM

## 2012-02-15 DIAGNOSIS — IMO0001 Reserved for inherently not codable concepts without codable children: Secondary | ICD-10-CM | POA: Diagnosis not present

## 2012-02-15 DIAGNOSIS — R269 Unspecified abnormalities of gait and mobility: Secondary | ICD-10-CM | POA: Diagnosis not present

## 2012-02-15 NOTE — Progress Notes (Signed)
Physical Therapy Treatment Patient Details  Name: [name] MRN: [id number] Date of Birth: 05-Feb-1955  Today's Date: 02/15/2012 Time: 4098-1191 PT Time Calculation (min): 54 min Neuro re-ed x 52 Visit#: 8  of 12   Re-eval: 02/22/12    Authorization: medicare  Authorization Visit#: 8  of 10    Subjective: Symptoms/Limitations Symptoms: PT states that the foam eye closing makes her nauseated.  Exercise/Treatments  Balance Exercises Standing Standing Eyes Closed: Foam;3 reps Tandem Gait: 2 reps Tandem Gait Limitations:  (no hand hold) Retro Gait: 2 reps Sidestepping: 2 reps Cone Rotation: Solid surface Marching: Solid surface;10 reps Heel Raises: 15 reps Toe Raise: 10 reps  Seated Other Seated Exercises: Bike 6'   Physical Therapy Assessment and Plan PT Assessment and Plan Clinical Impression Statement: Added cone rotation to program as well as standing on foam with eyes closed with difficulty.  PT nauseated with eyes closed on foam . Rehab Potential: Good PT Plan: pt to continue current program.  Once balance is better begin tandem on foam.    Goals Home Exercise Program PT Goal: Perform Home Exercise Program - Progress: Met PT Short Term Goals PT Short Term Goal 1 - Progress: Met PT Long Term Goals PT Long Term Goal 1 - Progress: Progressing toward goal PT Long Term Goal 2 - Progress: Met Long Term Goal 3 Progress: Progressing toward goal  Problem List Patient Active Problem List  Diagnosis  . Abnormality of gait  . Multiple sclerosis  . History of falling  . Bilateral leg weakness  . Difficulty walking      RUSSELL,CINDY 02/15/2012, 4:11 PM

## 2012-02-17 ENCOUNTER — Ambulatory Visit (HOSPITAL_COMMUNITY)
Admission: RE | Admit: 2012-02-17 | Discharge: 2012-02-17 | Disposition: A | Discharge status: Home / Self Care | Payer: Medicare Other | Source: Ambulatory Visit | Attending: Neurology | Admitting: Neurology

## 2012-02-17 DIAGNOSIS — M6281 Muscle weakness (generalized): Secondary | ICD-10-CM | POA: Diagnosis not present

## 2012-02-17 DIAGNOSIS — R269 Unspecified abnormalities of gait and mobility: Secondary | ICD-10-CM | POA: Diagnosis not present

## 2012-02-17 DIAGNOSIS — IMO0001 Reserved for inherently not codable concepts without codable children: Secondary | ICD-10-CM | POA: Diagnosis not present

## 2012-02-17 DIAGNOSIS — Z9181 History of falling: Secondary | ICD-10-CM | POA: Diagnosis not present

## 2012-02-17 NOTE — Progress Notes (Signed)
Physical Therapy Treatment Patient Details  Name: ENNA WARWICK MRN: [id number] Date of Birth: Dec 18, 1955  Today's Date: 02/17/2012 Time: 4098-1191 PT Time Calculation (min): 46 min Visit#: 9  of 12   Re-eval: 02/22/12 Charges:  therex 42' Authorization: medicare  Authorization Visit#: 9  of 10    Subjective: Symptoms/Limitations Symptoms: Pt states she is doing well today.  States she did well walking in the ice/snow but may have overdone it today going to do her laundry. Pain Assessment Currently in Pain?: No/denies   Exercise/Treatments Balance Exercises Standing Standing Eyes Closed: Foam;3 reps Tandem Gait: 2 reps Retro Gait: 2 reps Sidestepping: 2 reps Cone Rotation: Foam;R/L Marching: Solid surface;10 reps  Seated Other Seated Exercises: Bike 6'    Physical Therapy Assessment and Plan PT Assessment and Plan Clinical Impression Statement: Progressed cone activity with foam to challenge balance.  Pt. tends to go forward and to the right when attempting balance on foam with eyes closed.  Noted LE fatigue toward end of session.  Continues to display decreased safety when going to sit due to not backing completely to chair and reaching back out of BOS for chair. Rehab Potential: Good PT Plan: Pt to continue current program.  Once balance is better begin tandem on foam. G-Codes due next visit.     Problem List Patient Active Problem List  Diagnosis  . Abnormality of gait  . Multiple sclerosis  . History of falling  . Bilateral leg weakness  . Difficulty walking      Lurena Nida, PTA/CLT 02/17/2012, 5:16 PM

## 2012-02-18 ENCOUNTER — Telehealth (HOSPITAL_COMMUNITY): Payer: Self-pay | Admitting: Physical Therapy

## 2012-02-19 ENCOUNTER — Ambulatory Visit (HOSPITAL_COMMUNITY)
Admission: RE | Admit: 2012-02-19 | Discharge: 2012-02-19 | Disposition: A | Discharge status: Home / Self Care | Payer: Medicare Other | Source: Ambulatory Visit | Attending: Neurology | Admitting: Neurology

## 2012-02-19 DIAGNOSIS — IMO0001 Reserved for inherently not codable concepts without codable children: Secondary | ICD-10-CM | POA: Diagnosis not present

## 2012-02-19 DIAGNOSIS — R29898 Other symptoms and signs involving the musculoskeletal system: Secondary | ICD-10-CM

## 2012-02-19 DIAGNOSIS — M6281 Muscle weakness (generalized): Secondary | ICD-10-CM | POA: Diagnosis not present

## 2012-02-19 DIAGNOSIS — R262 Difficulty in walking, not elsewhere classified: Secondary | ICD-10-CM

## 2012-02-19 DIAGNOSIS — R269 Unspecified abnormalities of gait and mobility: Secondary | ICD-10-CM | POA: Diagnosis not present

## 2012-02-19 DIAGNOSIS — Z9181 History of falling: Secondary | ICD-10-CM

## 2012-02-19 NOTE — Progress Notes (Signed)
Physical Therapy Treatment Patient Details  Name: ADANYA SOSINSKI MRN: [id number] Date of Birth: 11-Dec-1955  Today's Date: 02/19/2012 Time: 0930-1016 PT Time Calculation (min): 46 min  Visit#: 10  of 12   Re-eval: 02/22/12   Charges:  42' Authorization: medicare  Authorization Visit#: 10  of 20    Subjective: Symptoms/Limitations Symptoms: Pt states she is working on her balance at home.    Exercise/Treatments  Balance Exercises Standing Standing Eyes Closed: Foam;3 reps Tandem Stance: Eyes open;2 reps Tandem Gait: 2 reps Retro Gait: 2 reps Sidestepping: 2 reps Cone Rotation: Foam;R/L Marching: Solid surface;10 reps   Seated Other Seated Exercises: Bike 6'    Physical Therapy Assessment and Plan PT Assessment and Plan Clinical Impression Statement: Pt with good form sit to stand; decreased rest breaks; added t-band to side stepping activity.  Pt still needs ro be reminded to back up to the chair and reach to sit down as opposed to reaching for the chair and leaving her walker behind.   Rehab Potential: Good PT Plan: Pt to continue with balance and strengthening.  Pt to be reassessed next visit.    Goals    Problem List Patient Active Problem List  Diagnosis  . Abnormality of gait  . Multiple sclerosis  . History of falling  . Bilateral leg weakness  . Difficulty walking    PT - End of Session Equipment Utilized During Treatment: Gait belt Activity Tolerance: Patient tolerated treatment well General Behavior During Session: [facility] for tasks performed Cognition: [facility] for tasks performed  GP Functional Assessment Tool Used: ABC Functional Limitation: Mobility: Walking and moving around Mobility: Walking and Moving Around Current Status (Z3086): At least 20 percent but less than 40 percent impaired, limited or restricted Mobility: Walking and Moving Around Goal Status [phone]): At least 1 percent but less than 20 percent impaired, limited or  restricted  Tvisha Schwoerer,CINDY 02/19/2012, 3:51 PM

## 2012-02-22 ENCOUNTER — Ambulatory Visit (HOSPITAL_COMMUNITY)
Admission: RE | Admit: 2012-02-22 | Discharge: 2012-02-22 | Disposition: A | Discharge status: Home / Self Care | Payer: Medicare Other | Source: Ambulatory Visit | Attending: Neurology | Admitting: Neurology

## 2012-02-22 DIAGNOSIS — M6281 Muscle weakness (generalized): Secondary | ICD-10-CM | POA: Diagnosis not present

## 2012-02-22 DIAGNOSIS — IMO0001 Reserved for inherently not codable concepts without codable children: Secondary | ICD-10-CM | POA: Diagnosis not present

## 2012-02-22 DIAGNOSIS — R269 Unspecified abnormalities of gait and mobility: Secondary | ICD-10-CM | POA: Diagnosis not present

## 2012-02-22 DIAGNOSIS — Z9181 History of falling: Secondary | ICD-10-CM | POA: Diagnosis not present

## 2012-02-22 NOTE — Progress Notes (Signed)
Physical Therapy Treatment Patient Details  Name: [name] MRN: [id number] Date of Birth: 05-06-1955  Today's Date: 02/22/2012 Time: 1520-1605 PT Time Calculation (min): 45 min  Visit#: 11  of 12   Re-eval: 02/22/12 Charges: Therex x 8' NMR x 30'  Authorization: medicare  Authorization Visit#: 11  of 20    Subjective: Symptoms/Limitations Symptoms: "I think my walking is getting better." Pain Assessment Currently in Pain?: No/denies   Exercise/Treatments Balance Exercises Standing Standing Eyes Closed: 2 reps;Limitations Standing Eyes Closed Limitations: 1' each Tandem Gait: 2 reps (1RT with HHA 1 RT w/o HHA) Retro Gait: 2 reps Sidestepping: 2 reps  Seated Other Seated Exercises: Bike 8' @ 1.0   Physical Therapy Assessment and Plan PT Assessment and Plan Clinical Impression Statement: Pt displays improved proprioceptive control with standing with eyes closed. Pt has multiple LOB with tandem and retro gt requiring moderate assistance to recover. Pt continues to require multiple rest breaks throughout session. Pt states that she is noticing improvements in her gait and balance. PT Plan: Pt to continue with balance and strengthening.  Pt to be reassessed next visit.     Problem List Patient Active Problem List  Diagnosis  . Abnormality of gait  . Multiple sclerosis  . History of falling  . Bilateral leg weakness  . Difficulty walking    PT - End of Session Equipment Utilized During Treatment: Gait belt Activity Tolerance: Patient tolerated treatment well General Behavior During Session: [facility] for tasks performed Cognition: [facility] for tasks performed  GP Functional Assessment Tool Used: ABC  Seth Bake, PTA 02/22/2012, 4:47 PM

## 2012-02-24 ENCOUNTER — Ambulatory Visit (HOSPITAL_COMMUNITY): Payer: Medicare Other | Admitting: Physical Therapy

## 2012-02-26 ENCOUNTER — Ambulatory Visit (HOSPITAL_COMMUNITY)
Admission: RE | Admit: 2012-02-26 | Discharge: 2012-02-26 | Disposition: A | Discharge status: Home / Self Care | Payer: Medicare Other | Source: Ambulatory Visit | Attending: Neurology | Admitting: Neurology

## 2012-02-26 DIAGNOSIS — R29898 Other symptoms and signs involving the musculoskeletal system: Secondary | ICD-10-CM

## 2012-02-26 DIAGNOSIS — IMO0001 Reserved for inherently not codable concepts without codable children: Secondary | ICD-10-CM | POA: Diagnosis not present

## 2012-02-26 DIAGNOSIS — M6281 Muscle weakness (generalized): Secondary | ICD-10-CM | POA: Diagnosis not present

## 2012-02-26 DIAGNOSIS — R262 Difficulty in walking, not elsewhere classified: Secondary | ICD-10-CM

## 2012-02-26 DIAGNOSIS — Z9181 History of falling: Secondary | ICD-10-CM

## 2012-02-26 DIAGNOSIS — R269 Unspecified abnormalities of gait and mobility: Secondary | ICD-10-CM | POA: Diagnosis not present

## 2012-02-26 NOTE — Evaluation (Signed)
Physical Therapy Reassessment Patient Details  Name: [name] MRN: [id number] Date of Birth: 06-10-1955  Today's Date: 02/26/2012 Time: 1517-1600 PT Time Calculation (min): 43 min Charge;  Mm test; physical performance test; self care x 16 Visit#: 12  of 24   Re-eval: 03/27/12 Assessment Diagnosis: hx of falling Next MD Visit: 03/04/2012  Authorization: Medicare  Authorization Time Period:    Authorization Visit#: 12  of 20    Past Medical History:  Past Medical History  Diagnosis Date  . Diabetes mellitus without complication   . Hypertension   . Morbid obesity   . High cholesterol   . MS (multiple sclerosis) relapsing remitting   Past Surgical History:  Past Surgical History  Procedure Date  . Cholecystectomy   . Left oophorectomy     Subjective Symptoms/Limitations Symptoms: Pt states she does not have difficulty getting up if she has fallen inside her house it is when she is out side that she has difficulty    Prior Functioning  Home Living Lives With: Alone Type of Home: Apartment  Cognition/Observation Cognition Overall Cognitive Status: Appears within functional limits for tasks assessed  Sensation/Coordination/Flexibility/Functional Tests Functional Tests Functional Tests: sit to stand no hands 6/ 30 sec.  Assessment RLE Strength Right Hip Flexion: 3-/5 (was 2+) Right Hip Extension: 3+/5 Right Hip ABduction: 3-/5 (was a 2/5) Right Knee Flexion: 3/5 (was 3/5) Right Knee Extension: 4/5 (was 3/5) Right Ankle Dorsiflexion: 4/5 (was 3+) LLE Strength Left Hip Flexion: 3-/5 (was 3-/5) Left Hip Extension: 3+/5 (was 3/5) Left Hip ABduction: 3-/5 (was 2+/5) Left Knee Flexion:  (4-/5 was 3+/5) Left Knee Extension: 4/5 (was 3+) Left Ankle Dorsiflexion: 4/5 (was 3+)  Exercise/Treatments Mobility/Balance  Berg Balance Test Sit to Stand: Able to stand without using hands and stabilize independently Standing Unsupported: Able to stand safely 2  minutes Sitting with Back Unsupported but Feet Supported on Floor or Stool: Able to sit safely and securely 2 minutes Stand to Sit: Sits safely with minimal use of hands Transfers: Able to transfer safely, definite need of hands Standing Unsupported with Eyes Closed: Able to stand 10 seconds with supervision Standing Ubsupported with Feet Together: Able to place feet together independently and stand for 1 minute with supervision From Standing, Reach Forward with Outstretched Arm: Can reach forward >12 cm safely (5") From Standing Position, Pick up Object from Floor: Able to pick up shoe safely and easily From Standing Position, Turn to Look Behind Over each Shoulder: Looks behind one side only/other side shows less weight shift Turn 360 Degrees: Able to turn 360 degrees safely but slowly Standing Unsupported, Alternately Place Feet on Step/Stool: Needs assistance to keep from falling or unable to try Standing Unsupported, One Foot in Front: Loses balance while stepping or standing Standing on One Leg: Tries to lift leg/unable to hold 3 seconds but remains standing independently Total Score: 38       Supine Other Supine Exercises: prone-hip extensor x 10     Physical Therapy Assessment and Plan PT Assessment and Plan Clinical Impression Statement: Attempted to get off floor I without using furniture as if pt had fallen outside but pt was unable to do this.  Pt has decreased tall kneeling strength and confidence.  Pt overall has improved in mm strength and balsnce and wil would benefit from continued therapy to improve safety . PT Frequency: Min 3X/week PT Duration: 4 weeks PT Treatment/Interventions: Gait training;Functional mobility training;Therapeutic activities;Therapeutic exercise;Balance training;Patient/family education PT Plan: begin tall kneeling activity  to improve safety and confidence in this position.  Then progress to the ability to come from supine to stand without furniture  to assist.    Goals Home Exercise Program PT Goal: Perform Home Exercise Program - Progress: Met PT Short Term Goals PT Short Term Goal 1 - Progress: Progressing toward goal (more than 50% of mm has improved 1/2 grade) PT Long Term Goals PT Long Term Goal 1 - Progress: Not met PT Long Term Goal 2 - Progress: Met Long Term Goal 3 Progress: Progressing toward goal (Berg score has increased by 6 levels)  Problem List Patient Active Problem List  Diagnosis  . Abnormality of gait  . Multiple sclerosis  . History of falling  . Bilateral leg weakness  . Difficulty walking    PT - End of Session Activity Tolerance: Patient tolerated treatment well General Behavior During Session: [facility] for tasks performed Cognition: [facility] for tasks performed PT Plan of Care PT Home Exercise Plan: instructed in rollijg and prone hip extension.  GP    RUSSELL,CINDY 02/26/2012, 4:15 PM  Physician Documentation Your signature is required to indicate approval of the treatment plan as stated above.  Please sign and either send electronically or make a copy of this report for your files and return this physician signed original.   Please mark one 1.__approve of plan  2. ___approve of plan with the following conditions.   ______________________________                                                          _____________________ Physician Signature                                                                                                             Date

## 2012-02-29 ENCOUNTER — Ambulatory Visit (HOSPITAL_COMMUNITY)
Admission: RE | Admit: 2012-02-29 | Discharge: 2012-02-29 | Disposition: A | Discharge status: Home / Self Care | Payer: Medicare Other | Source: Ambulatory Visit | Attending: Neurology | Admitting: Neurology

## 2012-02-29 DIAGNOSIS — Z9181 History of falling: Secondary | ICD-10-CM | POA: Diagnosis not present

## 2012-02-29 DIAGNOSIS — M6281 Muscle weakness (generalized): Secondary | ICD-10-CM | POA: Diagnosis not present

## 2012-02-29 DIAGNOSIS — R269 Unspecified abnormalities of gait and mobility: Secondary | ICD-10-CM | POA: Diagnosis not present

## 2012-02-29 DIAGNOSIS — IMO0001 Reserved for inherently not codable concepts without codable children: Secondary | ICD-10-CM | POA: Insufficient documentation

## 2012-02-29 NOTE — Progress Notes (Signed)
Physical Therapy Treatment Patient Details  Name: [name] MRN: [id number] Date of Birth: 11/17/1955  Today's Date: 02/29/2012 Time: 1518 (began w/ PTA (RS))-1600 (Ended w/PT (LM)) PT Time Calculation (min): 42 min Charges: 25' NMR, 8' TE Visit#: 38  (began w/ PTA (RS)) of 24   Re-eval: 03/27/12    Authorization: Medicare  Authorization Time Period:    Authorization Visit#: 13  of 20    Subjective: Symptoms/Limitations Symptoms: Pt states that she has good days and bad days with her balance. She states that she is having more good days. Pain Assessment Currently in Pain?: No/denies  Precautions/Restrictions     Exercise/Treatments Standing Tandem Gait: 2 reps Retro Gait: 2 reps Sidestepping: 2 reps Other Standing Exercises: Heel Raises w/o UE A 10 reps Other Standing Exercises: hip abd x5 BLE ext x10  Seated Other Seated Exercises: Bike 8' @ 1.0   Physical Therapy Assessment and Plan PT Assessment and Plan Clinical Impression Statement: Pt continues to require RB between each activity due to energy conservation needs.  Added standing activities to improve LE strength and improve balance w/o HHA.  PT Plan: begin tall kneeling activity to improve safety and confidence in this position.  Then progress to the ability to come from supine to stand without furniture to assist.    Goals    Problem List Patient Active Problem List  Diagnosis  . Abnormality of gait  . Multiple sclerosis  . History of falling  . Bilateral leg weakness  . Difficulty walking    PT - End of Session Activity Tolerance: Patient tolerated treatment well General Behavior During Session: [facility] for tasks performed Cognition: [facility] for tasks performed  GP    Seth Bake Leah/ Annett Fabian, PT 02/29/2012, 4:07 PM

## 2012-03-02 ENCOUNTER — Ambulatory Visit (HOSPITAL_COMMUNITY)
Admission: RE | Admit: 2012-03-02 | Discharge: 2012-03-02 | Disposition: A | Discharge status: Home / Self Care | Payer: Medicare Other | Source: Ambulatory Visit | Attending: Neurology | Admitting: Neurology

## 2012-03-02 DIAGNOSIS — R269 Unspecified abnormalities of gait and mobility: Secondary | ICD-10-CM | POA: Diagnosis not present

## 2012-03-02 DIAGNOSIS — IMO0001 Reserved for inherently not codable concepts without codable children: Secondary | ICD-10-CM | POA: Diagnosis not present

## 2012-03-02 DIAGNOSIS — Z9181 History of falling: Secondary | ICD-10-CM | POA: Diagnosis not present

## 2012-03-02 DIAGNOSIS — M6281 Muscle weakness (generalized): Secondary | ICD-10-CM | POA: Diagnosis not present

## 2012-03-02 NOTE — Progress Notes (Signed)
Physical Therapy Treatment Patient Details  Name: TRICHA RUGGIRELLO MRN: [id number] Date of Birth: Aug 27, 1955  Today's Date: 03/02/2012 Time: 1513-1610 PT Time Calculation (min): 57 min Visit#: 14  of 24   Re-eval: 03/27/12 Charges:  NMR 25', therex 18  Authorization: Medicare  Authorization Visit#: 14  of 20    Subjective: Symptoms/Limitations Symptoms: Pt. states she is fatigued today from doing laundry and shopping at [employer] yesterday.  States she is not hurting today. Pain Assessment Currently in Pain?: No/denies   Exercise/Treatments Balance Exercises Standing Tandem Gait: 2 reps Cone Rotation: Foam;R/L;Limitations Cone Rotation Limitations: high to low surface Marching: Solid surface;10 reps;Hand held assist (HHA) 1 Other Standing Exercises: Hip extension 5reps X 2 each LE  Seated Other Seated Exercises: Bike 8' @ 1.0 Other Seated Exercises: tall kneel tolerance 1 minute X 2 on mat  Supine Other Supine Exercises: Prone hamstring curls 10 reps each     Physical Therapy Assessment and Plan PT Assessment and Plan Clinical Impression Statement: Continues to require seated rest break between each activity.  Unable to perform hip extension in prone even with knee flexed due to weakness; instructed with standing hip extension with tactile cues for posture, however able to complete.  Focused on static standing activities and activity tolerance in tall kneeling today.  Pt. was able to balance X 1 minute max in tall kneeling.  Pt. brought in tennis balls for bottoms of walker and able to secure those for patient. Marching continues to be difficult for patient to complete. PT Plan: Progress tall kneeling activity tolerance to improve safety and confidence in this position.  Then progress to the ability to come from supine to stand without furniture to assist.     Problem List Patient Active Problem List  Diagnosis  . Abnormality of gait  . Multiple sclerosis  . History of falling   . Bilateral leg weakness  . Difficulty walking    PT - End of Session Activity Tolerance: Patient tolerated treatment well General Behavior During Session: [facility] for tasks performed Cognition: [facility] for tasks performed   Lurena Nida, PTA/CLT 03/02/2012, 4:41 PM

## 2012-03-04 ENCOUNTER — Ambulatory Visit (HOSPITAL_COMMUNITY)
Admission: RE | Admit: 2012-03-04 | Discharge: 2012-03-04 | Disposition: A | Discharge status: Home / Self Care | Payer: Medicare Other | Source: Ambulatory Visit | Attending: Neurology | Admitting: Neurology

## 2012-03-04 DIAGNOSIS — IMO0001 Reserved for inherently not codable concepts without codable children: Secondary | ICD-10-CM | POA: Diagnosis not present

## 2012-03-04 DIAGNOSIS — R262 Difficulty in walking, not elsewhere classified: Secondary | ICD-10-CM

## 2012-03-04 DIAGNOSIS — M6281 Muscle weakness (generalized): Secondary | ICD-10-CM | POA: Diagnosis not present

## 2012-03-04 DIAGNOSIS — Z9181 History of falling: Secondary | ICD-10-CM | POA: Diagnosis not present

## 2012-03-04 DIAGNOSIS — R269 Unspecified abnormalities of gait and mobility: Secondary | ICD-10-CM | POA: Diagnosis not present

## 2012-03-04 DIAGNOSIS — R29898 Other symptoms and signs involving the musculoskeletal system: Secondary | ICD-10-CM

## 2012-03-04 NOTE — Progress Notes (Signed)
Physical Therapy Treatment Patient Details  Name: [name] MRN: [id number] Date of Birth: April 17, 1955  Today's Date: 03/04/2012 Time: 4696-2952 PT Time Calculation (min): 41 min  Visit#: 15  of 24   Re-eval: 03/28/12  charge:  There activity x 40  Authorization: medicare   Authorization Visit#: 15  of 20    Subjective: Symptoms/Limitations Symptoms: Pt states she is not sure what happened last treatment but she has not been able to pick up her right leg as well ever since. Pain Assessment Currently in Pain?: No/denies  Precautions/Restrictions   falls  Exercise/Treatments  Balance Exercises Standing SLS: Eyes open;Hand held assist (HHA) 2;3 reps Tandem Gait: 3 reps Sit to Stand: Standard surface Sit to Stand Limitations: x10 Lift / Chop: Limitations Left / Chop Limitations: heep raise/toe raise x 10 Other Standing Exercises: crawl/tall kneel walk on mat; Prone B hip ext x 10 Other Standing Exercises: supine bridge; ab curl,oblique curls.    Physical Therapy Assessment and Plan PT Assessment and Plan Clinical Impression Statement: Pt needs less rest breaks but appears frustrated with progress. Explained that MS is progressive and the goal is to make pt as safe as possible but recovery will not be the goal PT Plan: continue to work with balance and strengh of core and LE    Goals Home Exercise Program PT Goal: Perform Home Exercise Program - Progress: Met PT Short Term Goals PT Short Term Goal 1 - Progress: Progressing toward goal PT Long Term Goals PT Long Term Goal 1 - Progress: Not met PT Long Term Goal 2 - Progress: Met Long Term Goal 3 Progress: Progressing toward goal  Problem List Patient Active Problem List  Diagnosis  . Abnormality of gait  . Multiple sclerosis  . History of falling  . Bilateral leg weakness  . Difficulty walking     GP    Quasean Frye,CINDY 03/04/2012, 4:45 PM

## 2012-03-07 ENCOUNTER — Ambulatory Visit (HOSPITAL_COMMUNITY)
Admission: RE | Admit: 2012-03-07 | Discharge: 2012-03-07 | Disposition: A | Discharge status: Home / Self Care | Payer: Medicare Other | Source: Ambulatory Visit | Attending: Neurology | Admitting: Neurology

## 2012-03-07 DIAGNOSIS — R29898 Other symptoms and signs involving the musculoskeletal system: Secondary | ICD-10-CM

## 2012-03-07 DIAGNOSIS — R269 Unspecified abnormalities of gait and mobility: Secondary | ICD-10-CM | POA: Diagnosis not present

## 2012-03-07 DIAGNOSIS — Z9181 History of falling: Secondary | ICD-10-CM

## 2012-03-07 DIAGNOSIS — IMO0001 Reserved for inherently not codable concepts without codable children: Secondary | ICD-10-CM | POA: Diagnosis not present

## 2012-03-07 DIAGNOSIS — R262 Difficulty in walking, not elsewhere classified: Secondary | ICD-10-CM

## 2012-03-07 DIAGNOSIS — M6281 Muscle weakness (generalized): Secondary | ICD-10-CM | POA: Diagnosis not present

## 2012-03-07 NOTE — Progress Notes (Signed)
Physical Therapy Treatment Patient Details  Name: [name] MRN: [id number] Date of Birth: [birth year]  Today's Date: 03/07/2012 Time: 1520-1603 PT Time Calculation (min): 43 min  Visit#: 16 of 24  Re-eval:  03/28/2012  charge:  There ex 40  Authorization: medicare  Authorization Visit#: 16 of 20   Subjective: Symptoms/Limitations Symptoms: Pt states she is not feeling as if she is dragging her feet as much.  No pain   Exercise/Treatments  Balance Exercises Standing SLS: Eyes open;Hand held assist (HHA) 2;3 reps Tandem Gait: 2 reps Retro Gait: 2 reps Marching: 10 reps Sit to Stand: Standard surface Sit to Stand Limitations: x10 Lift / Chop: Limitations Left / Chop Limitations: heep raise/toe raise x 10 Other Standing Exercises: standing with eyes closed no HH Other Standing Exercises: hip ext/ab x 10     Seated Other Seated Exercises: Bike x 8 @ 2.0 Other Seated Exercises: sit to stand x 10       Physical Therapy Assessment and Plan PT Assessment and Plan Clinical Impression Statement: Pt has improved gt today not dragging her right foot as much as last week.  Decreased rest breaks Rehab Potential: Good PT Plan: Continue to work on Media pl[employer]    Goals Home Exercise Program PT Goal: Perform Home Exercise Program - Progress: Met PT Short Term Goals PT Short Term Goal 1 - Progress: Met PT Long Term Goals PT Long Term Goal 1 - Progress: Progressing toward goal PT Long Term Goal 2 - Progress: Met  Problem List Patient Active Problem List  Diagnosis  . Abnormality of gait  . Multiple sclerosis  . History of falling  . Bilateral leg weakness  . Difficulty walking    PT - End of Session Equipment Utilized During Treatment: Gait belt Activity Tolerance: Patient tolerated treatment well except for bike; fatigued after 6' of 8' goal General Behavior During Session: [facility] for tasks performed Cognition: [facility] for tasks performed  GP     Rhone Ozaki,CINDY 03/07/2012, 4:04 PM

## 2012-03-09 ENCOUNTER — Ambulatory Visit (HOSPITAL_COMMUNITY)
Admission: RE | Admit: 2012-03-09 | Discharge: 2012-03-09 | Disposition: A | Discharge status: Home / Self Care | Payer: Medicare Other | Source: Ambulatory Visit | Attending: Neurology | Admitting: Neurology

## 2012-03-09 DIAGNOSIS — Z9181 History of falling: Secondary | ICD-10-CM | POA: Diagnosis not present

## 2012-03-09 DIAGNOSIS — IMO0001 Reserved for inherently not codable concepts without codable children: Secondary | ICD-10-CM | POA: Diagnosis not present

## 2012-03-09 DIAGNOSIS — R269 Unspecified abnormalities of gait and mobility: Secondary | ICD-10-CM | POA: Diagnosis not present

## 2012-03-09 DIAGNOSIS — M6281 Muscle weakness (generalized): Secondary | ICD-10-CM | POA: Diagnosis not present

## 2012-03-09 NOTE — Progress Notes (Signed)
Physical Therapy Treatment Patient Details  Name: [name] MRN: [id number] Date of Birth: [birth year]  Today's Date: 03/09/2012 Time: 0940-1015 PT Time Calculation (min): 35 min  Visit#: 17 of 24  Re-eval: 03/28/12 Charges: NMR x 30'   Authorization: medicare   Authorization Visit#: 17 of 20   Subjective: Symptoms/Limitations Symptoms: Pt states that she has more energy in the mornings. Pain Assessment Currently in Pain?: No/denies   Exercise/Treatments Balance Exercises Standing Tandem Stance: 20 secs;1 rep;Eyes open (no HHA B) Sit to Stand: Standard surface Sit to Stand Limitations: x10 Other Standing Exercises: Standing reaching to touch therapists hand B   Physical Therapy Assessment and Plan PT Assessment and Plan Clinical Impression Statement: Pt appears to have more energy this session. This is most likely due to pt having morning appointment. Pt is able to correct LOB more often. Pt also requires less rest breaks this session.  PT Plan: Continue to progress on strength and balance per PT POC.     Problem List Patient Active Problem List  Diagnosis  . Abnormality of gait  . Multiple sclerosis  . History of falling  . Bilateral leg weakness  . Difficulty walking    PT - End of Session Equipment Utilized During Treatment: Gait belt Activity Tolerance: Patient tolerated treatment well General Behavior During Session: [facility] for tasks performed Cognition: [facility] for tasks performed  Seth Bake, PTA  03/09/2012, 1:11 PM

## 2012-03-10 ENCOUNTER — Ambulatory Visit (HOSPITAL_COMMUNITY): Payer: Medicare Other | Admitting: *Deleted

## 2012-03-14 ENCOUNTER — Ambulatory Visit (HOSPITAL_COMMUNITY)
Admission: RE | Admit: 2012-03-14 | Discharge: 2012-03-14 | Disposition: A | Discharge status: Home / Self Care | Payer: Medicare Other | Source: Ambulatory Visit | Attending: Neurology | Admitting: Neurology

## 2012-03-14 DIAGNOSIS — Z9181 History of falling: Secondary | ICD-10-CM | POA: Diagnosis not present

## 2012-03-14 DIAGNOSIS — IMO0001 Reserved for inherently not codable concepts without codable children: Secondary | ICD-10-CM | POA: Diagnosis not present

## 2012-03-14 DIAGNOSIS — R269 Unspecified abnormalities of gait and mobility: Secondary | ICD-10-CM | POA: Diagnosis not present

## 2012-03-14 DIAGNOSIS — M6281 Muscle weakness (generalized): Secondary | ICD-10-CM | POA: Diagnosis not present

## 2012-03-14 NOTE — Progress Notes (Signed)
Physical Therapy Treatment Patient Details  Name: [name] MRN: [id number] Date of Birth: [birth year]  Today's Date: 03/14/2012 Time: 1520-1615 PT Time Calculation (min): 55 min  Visit#: 17 of 24  Re-eval: 03/28/12 Authorization: medicare  Authorization Visit#: 17 of 20  Charges:  therex 45'  Subjective: Symptoms/Limitations Symptoms: Pt. reports she's aching alot but no real pain. Pain Assessment Currently in Pain?: No/denies   Exercise/Treatments Balance Exercises Standing Tandem Stance: 20 secs;1 rep;Eyes open Tandem Gait: 2 reps Retro Gait: 2 reps Marching: 10 reps;Hand held assist (HHA) 1;Limitations Marching Limitations: 2" holds Sit to Stand: Standard surface Sit to Stand Limitations: x10 Other Standing Exercises: hip ext/ab x 10  Seated Other Seated Exercises: Bike x 6@ 2.0       Modalities Modalities: Cryotherapy Manual Therapy Manual Therapy: Other (comment) Other Manual Therapy: MFR and scar massage to anterior and posterior knee f/b PROM Cryotherapy Cryotherapy Location: Knee Type of Cryotherapy: Ice pack  Physical Therapy Assessment and Plan PT Assessment and Plan Clinical Impression Statement: Pt. with more fatigue/rest breaks needed this session due to inactivity due to weather.  Began to c/o light headedness toward end of session.    Pt. encouraged to increase activity at home. PT Plan: Continue to progress on strength and balance per PT POC.     Problem List Patient Active Problem List  Diagnosis  . Abnormality of gait  . Multiple sclerosis  . History of falling  . Bilateral leg weakness  . Difficulty walking    PT - End of Session Equipment Utilized During Treatment: Gait belt Activity Tolerance: Patient tolerated treatment well General Behavior During Session: [facility] for tasks performed Cognition: [facility] for tasks performed   Lurena Nida, PTA/CLT 03/14/2012, 4:21 PM

## 2012-03-16 ENCOUNTER — Ambulatory Visit (HOSPITAL_COMMUNITY)
Admission: RE | Admit: 2012-03-16 | Discharge: 2012-03-16 | Disposition: A | Discharge status: Home / Self Care | Payer: Medicare Other | Source: Ambulatory Visit | Attending: Neurology | Admitting: Neurology

## 2012-03-16 DIAGNOSIS — IMO0001 Reserved for inherently not codable concepts without codable children: Secondary | ICD-10-CM | POA: Diagnosis not present

## 2012-03-16 DIAGNOSIS — M6281 Muscle weakness (generalized): Secondary | ICD-10-CM | POA: Diagnosis not present

## 2012-03-16 DIAGNOSIS — Z9181 History of falling: Secondary | ICD-10-CM | POA: Diagnosis not present

## 2012-03-16 DIAGNOSIS — R269 Unspecified abnormalities of gait and mobility: Secondary | ICD-10-CM | POA: Diagnosis not present

## 2012-03-16 NOTE — Progress Notes (Signed)
Physical Therapy Treatment Patient Details  Name: [name] MRN: [id number] Date of Birth: 1955/02/16  Today's Date: 03/16/2012 Time: 0932-1018 PT Time Calculation (min): 46 min Visit#: 18 of 24  Re-eval: 03/28/12 Authorization: medicare  Authorization Visit#: 18 of 20  Charges:  therex 42'  Subjective: Symptoms/Limitations Symptoms: Pt. states she is still feeling weaker than usual.  States her legs are not wanting to move how she wants. Pain Assessment Currently in Pain?: No/denies   Exercise/Treatments Balance Exercises Standing Tandem Stance: 30 secs;1 rep;Eyes open Tandem Gait: 2 reps Retro Gait: 2 reps Marching: 10 reps;Hand held assist (HHA) 1;Limitations Marching Limitations: 2" holds Sit to Stand: Standard surface Sit to Stand Limitations: x10 Other Standing Exercises: alternating toe taps with 6" box 8 reps Other Standing Exercises: hip ext/ab x 10        Physical Therapy Assessment and Plan PT Assessment and Plan Clinical Impression Statement: Pt. with difficulty lifting R LE today with marching, toe taps and ambulation.  More diffiuculty maintaining balance with R foot lead with tandem stance.  Continues to require frequent rest breaks during session due to fatigue. PT Plan: Continue to progress on strength and balance per PT POC.  G-Codes in 2 more visits.     Problem List Patient Active Problem List  Diagnosis  . Abnormality of gait  . Multiple sclerosis  . History of falling  . Bilateral leg weakness  . Difficulty walking    PT - End of Session Equipment Utilized During Treatment: Gait belt Activity Tolerance: Patient tolerated treatment well General Behavior During Session: [facility] for tasks performed Cognition: [facility] for tasks performed   Lurena Nida, PTA/CLT 03/16/2012, 10:38 AM

## 2012-03-18 ENCOUNTER — Ambulatory Visit (HOSPITAL_COMMUNITY)
Admission: RE | Admit: 2012-03-18 | Discharge: 2012-03-18 | Disposition: A | Discharge status: Home / Self Care | Payer: Medicare Other | Source: Ambulatory Visit | Attending: Neurology | Admitting: Neurology

## 2012-03-18 ENCOUNTER — Ambulatory Visit (HOSPITAL_COMMUNITY): Payer: Medicare Other

## 2012-03-18 DIAGNOSIS — R29898 Other symptoms and signs involving the musculoskeletal system: Secondary | ICD-10-CM

## 2012-03-18 DIAGNOSIS — Z9181 History of falling: Secondary | ICD-10-CM | POA: Diagnosis not present

## 2012-03-18 DIAGNOSIS — M6281 Muscle weakness (generalized): Secondary | ICD-10-CM | POA: Diagnosis not present

## 2012-03-18 DIAGNOSIS — R262 Difficulty in walking, not elsewhere classified: Secondary | ICD-10-CM

## 2012-03-18 DIAGNOSIS — IMO0001 Reserved for inherently not codable concepts without codable children: Secondary | ICD-10-CM | POA: Diagnosis not present

## 2012-03-18 DIAGNOSIS — R269 Unspecified abnormalities of gait and mobility: Secondary | ICD-10-CM | POA: Diagnosis not present

## 2012-03-18 NOTE — Progress Notes (Signed)
Physical Therapy Treatment Patient Details  Name: [name] MRN: [id number] Date of Birth: February 13, 1955  Today's Date: 03/18/2012 Time: 4098-1191 PT Time Calculation (min): 43 min  Visit#: 19 of 24  Re-eval: 03/28/12  charge:  There activity x 42  Authorization: medicare     Authorization Visit#: 19 of 20   Subjective: Symptoms/Limitations Symptoms: Pt states that she feels like she over did yesterday.  She feels as if her right leg is just giving out.    Exercise/Treatments  Balance Exercises Standing Standing Eyes Closed: 2 reps Tandem Stance: Eyes open;30 secs Tandem Gait: 2 reps Retro Gait: 2 reps Sidestepping: 2 reps Sit to Stand: Standard surface Sit to Stand Limitations: x5 Left / Chop Limitations: standing at wall B UE flex keeping core tight. x10 Other Standing Exercises: altenate toe tap w/ one hand hold at 6"  Other Standing Exercises: stand at wall and march x 7 @; Hip ab/ ex x 10; hip ab x 10/ ext x 10      Physical Therapy Assessment and Plan PT Assessment and Plan Clinical Impression Statement: Pt completed activities with better balace today.  Explained the importance of trying to keep core tight while ambulatign.  Pt rest breaks are overall decreasing.   Pt will benefit from skilled therapeutic intervention in order to improve on the following deficits: Abnormal gait;Decreased activity tolerance;Decreased balance;Difficulty walking;Decreased strength PT Frequency: Min 3X/week PT Treatment/Interventions: Gait training;Functional mobility training;Therapeutic activities;Therapeutic exercise;Balance training;Patient/family education PT Plan: Gcode due next visit using ABC; Pt condition MS is progressive goal is to stop falls.    Goals Home Exercise Program Pt will Perform Home Exercise Program: Independently PT Short Term Goals PT Short Term Goal 1: Pt strength to be improved 1/2 grade for patient to feel confident going sit to stand without using her  hands. PT Short Term Goal 1 - Progress: Progressing toward goal PT Long Term Goals PT Long Term Goal 1: Pt strength to be increased one grade to allow pt to be able to get up off the floor by herself PT Long Term Goal 1 - Progress: Progressing toward goal PT Long Term Goal 2: Pt to report no falls in the past two weeks PT Long Term Goal 2 - Progress: Met Long Term Goal 3: Berg score to be improved by 10 points to allow pt to feel comfortable ambulating in the house with a cane. Long Term Goal 3 Progress: Progressing toward goal  Problem List Patient Active Problem List  Diagnosis  . Abnormality of gait  . Multiple sclerosis  . History of falling  . Bilateral leg weakness  . Difficulty walking    PT - End of Session Equipment Utilized During Treatment: Gait belt Activity Tolerance: Patient tolerated treatment well General Behavior During Session: [facility] for tasks performed  GP Functional Assessment Tool Used: ABC  Kiran Carline,CINDY 03/18/2012, 12:16 PM

## 2012-03-21 ENCOUNTER — Ambulatory Visit (HOSPITAL_COMMUNITY)
Admission: RE | Admit: 2012-03-21 | Discharge: 2012-03-21 | Disposition: A | Discharge status: Home / Self Care | Payer: Medicare Other | Source: Ambulatory Visit | Attending: Neurology | Admitting: Neurology

## 2012-03-21 DIAGNOSIS — M6281 Muscle weakness (generalized): Secondary | ICD-10-CM | POA: Diagnosis not present

## 2012-03-21 DIAGNOSIS — Z9181 History of falling: Secondary | ICD-10-CM

## 2012-03-21 DIAGNOSIS — R262 Difficulty in walking, not elsewhere classified: Secondary | ICD-10-CM

## 2012-03-21 DIAGNOSIS — R29898 Other symptoms and signs involving the musculoskeletal system: Secondary | ICD-10-CM

## 2012-03-21 DIAGNOSIS — R269 Unspecified abnormalities of gait and mobility: Secondary | ICD-10-CM | POA: Diagnosis not present

## 2012-03-21 DIAGNOSIS — IMO0001 Reserved for inherently not codable concepts without codable children: Secondary | ICD-10-CM | POA: Diagnosis not present

## 2012-03-21 NOTE — Progress Notes (Signed)
Physical Therapy Treatment Patient Details  Name: [name] MRN: [id number] Date of Birth: [birth year]  Today's Date: 03/21/2012 Time: 0938-1020 PT Time Calculation (min): 42 min  Visit#: 20 of 24  Re-eval: 03/28/12    Authorization: medicare  Authorization Visit#: 20 of 24   Subjective: Symptoms/Limitations Symptoms: Pt feels weak today; working on posture ex at home.   Exercise/Treatments Balance Exercises Standing Standing Eyes Closed: 2 reps Tandem Stance: Eyes open;30 secs SLS: 3 reps Sit to Stand: Standard surface Sit to Stand Limitations: x5 (heelraise; toe raise x 0) Lift / Chop: Limitations Left / Chop Limitations: standing at wall B UE flex /abkeeping core tight. x10/  Other Standing Exercises: altenate toe tap w/ one hand hold at 6"  Other Standing Exercises: stand at wall and march x 10 @; Hip ab/ ex x 10; hip ab x 10/ ext x 10        Physical Therapy Assessment and Plan PT Assessment and Plan Clinical Impression Statement: Pt continues to need counseling that her disease is progressive and that goal of PT is to decrease fall risk, have pt to become I in exercises to slow down affects of disease process.  Pt frustrated at progression of balance and strength.   Pt will benefit from skilled therapeutic intervention in order to improve on the following deficits: Abnormal gait;Decreased activity tolerance;Decreased balance;Difficulty walking;Decreased strength Rehab Potential: Good PT Treatment/Interventions: Gait training;Functional mobility training;Therapeutic activities;Therapeutic exercise;Balance training;Patient/family education PT Plan: Reevaluate next week.    Goals Home Exercise Program Pt will Perform Home Exercise Program: Independently PT Goal: Perform Home Exercise Program - Progress: Met PT Short Term Goals PT Short Term Goal 1: Pt strength to be improved 1/2 grade for patient to feel confident going sit to stand without using her hands. PT  Short Term Goal 1 - Progress: Progressing toward goal PT Long Term Goals PT Long Term Goal 1: Pt strength to be increased one grade to allow pt to be able to get up off the floor by herself PT Long Term Goal 1 - Progress: Progressing toward goal PT Long Term Goal 2: Pt to report no falls in the past two weeks PT Long Term Goal 2 - Progress: Met Long Term Goal 3: Berg score to be improved by 10 points to allow pt to feel comfortable ambulating in the house with a cane. Long Term Goal 3 Progress: Progressing toward goal  Problem List Patient Active Problem List  Diagnosis  . Abnormality of gait  . Multiple sclerosis  . History of falling  . Bilateral leg weakness  . Difficulty walking    PT - End of Session Equipment Utilized During Treatment: Gait belt Activity Tolerance: Patient tolerated treatment well General Cognition: WFL for tasks performed  GP Functional Assessment Tool Used: ABC Functional Limitation: Mobility: Walking and moving around Mobility: Walking and Moving Around Current Status (J4782): At least 40 percent but less than 60 percent impaired, limited or restricted Mobility: Walking and Moving Around Goal Status [phone]): At least 1 percent but less than 20 percent impaired, limited or restricted  RUSSELL,CINDY 03/21/2012, 12:16 PM

## 2012-03-23 ENCOUNTER — Ambulatory Visit (HOSPITAL_COMMUNITY)
Admission: RE | Admit: 2012-03-23 | Discharge: 2012-03-23 | Disposition: A | Discharge status: Home / Self Care | Payer: Medicare Other | Source: Ambulatory Visit | Attending: Physical Therapy | Admitting: Physical Therapy

## 2012-03-23 DIAGNOSIS — Z9181 History of falling: Secondary | ICD-10-CM | POA: Diagnosis not present

## 2012-03-23 DIAGNOSIS — M6281 Muscle weakness (generalized): Secondary | ICD-10-CM | POA: Diagnosis not present

## 2012-03-23 DIAGNOSIS — R269 Unspecified abnormalities of gait and mobility: Secondary | ICD-10-CM | POA: Diagnosis not present

## 2012-03-23 DIAGNOSIS — IMO0001 Reserved for inherently not codable concepts without codable children: Secondary | ICD-10-CM | POA: Diagnosis not present

## 2012-03-23 NOTE — Progress Notes (Signed)
Physical Therapy Treatment Patient Details  Name: [name] MRN: [id number] Date of Birth: 1956/01/23  Today's Date: 03/23/2012 Time: 9629-5284 PT Time Calculation (min): 54 min  Visit#: 21 of 24  Re-eval: 03/28/12 Authorization: medicare  Authorization Visit#: 21 of 24  Charges: NMR 14', therex 59'  Subjective:Pt states she's feeling a little more energetic today. No pain reported.    Exercise/Treatments Balance Exercises Standing Standing Eyes Closed Limitations: 1' each Tandem Stance: Eyes open;30 secs SLS: 3 reps Tandem Gait: 2 reps Retro Gait: 2 reps Sidestepping: 1 rep;Limitations Sidestepping Limitations: faster with larger steps Marching: 15 reps Marching Limitations: 2" holds Sit to Stand: Standard surface Sit to Stand Limitations: x5 Left / Chop Limitations: Hip Abd/Ext 10 reps each Other Standing Exercises: altenate toe tap w/ one hand hold at 8"  Other Standing Exercises: stand at wall and march x 10, UE flexion X10, shoulder wall bumps 5X         Physical Therapy Assessment and Plan PT Assessment and Plan Clinical Impression Statement: Frequent VC's to lift R LE fully to advance forward due to weakness.  Tendency to shift weight forward and to the Left with balance activites but overall minimal LOB today.  Added wall bumps at wall with shoulders to promote core strengthening. Pt will benefit from skilled therapeutic intervention in order to improve on the following deficits: Abnormal gait;Decreased activity tolerance;Decreased balance;Difficulty walking;Decreased strength Rehab Potential: Good PT Treatment/Interventions: Gait training;Functional mobility training;Therapeutic activities;Therapeutic exercise;Balance training;Patient/family education PT Plan: Re-evaluate next visit.     Problem List Patient Active Problem List  Diagnosis  . Abnormality of gait  . Multiple sclerosis  . History of falling  . Bilateral leg weakness  . Difficulty walking     PT - End of Session Equipment Utilized During Treatment: Gait belt Activity Tolerance: Patient tolerated treatment well General Cognition: WFL for tasks performed   Lurena Nida, PTA/CLT 03/23/2012, 9:53 AM

## 2012-03-25 ENCOUNTER — Ambulatory Visit (HOSPITAL_COMMUNITY)
Admission: RE | Admit: 2012-03-25 | Discharge: 2012-03-25 | Disposition: A | Discharge status: Home / Self Care | Payer: Medicare Other | Source: Ambulatory Visit | Attending: Neurology | Admitting: Neurology

## 2012-03-25 DIAGNOSIS — M6281 Muscle weakness (generalized): Secondary | ICD-10-CM | POA: Diagnosis not present

## 2012-03-25 DIAGNOSIS — R262 Difficulty in walking, not elsewhere classified: Secondary | ICD-10-CM

## 2012-03-25 DIAGNOSIS — Z9181 History of falling: Secondary | ICD-10-CM | POA: Diagnosis not present

## 2012-03-25 DIAGNOSIS — R29898 Other symptoms and signs involving the musculoskeletal system: Secondary | ICD-10-CM

## 2012-03-25 DIAGNOSIS — IMO0001 Reserved for inherently not codable concepts without codable children: Secondary | ICD-10-CM | POA: Diagnosis not present

## 2012-03-25 DIAGNOSIS — R269 Unspecified abnormalities of gait and mobility: Secondary | ICD-10-CM | POA: Diagnosis not present

## 2012-03-25 NOTE — Evaluation (Signed)
Physical Therapy reassessment Patient Details  Name: [name] MRN: [id number] Date of Birth: 06/12/1955 Charge:  Mm test; physical performance and gt Today's Date: 03/25/2012 Time: 0930-1020 PT Time Calculation (min): 50 min  Visit#: 22 of 22  Re-eval: 03/25/12 Assessment Diagnosis: hx of falling Next MD Visit: 03/2012  Authorization: medicare   Authorization Visit#: 22 of 22   Past Medical History:  Past Medical History  Diagnosis Date  . Diabetes mellitus without complication   . Hypertension   . Morbid obesity   . High cholesterol   . MS (multiple sclerosis) relapsing remitting   Past Surgical History:  Past Surgical History  Procedure Laterality Date  . Cholecystectomy    . Left oophorectomy      Subjective Symptoms/Limitations Symptoms: I do not feel as if I am not walking as hunched over as I was.  Pt staes that she is parking further away in parking lots so that she needs to walk further. How long can you sit comfortably?: no problem  How long can you stand comfortably?: Able to stand for 10 minutes was 10 minues  How long can you walk comfortably?: Pt is able to walk for 12 minutes today was 10 Pain Assessment Currently in Pain?: No/denies    Prior Functioning  Home Living Lives With: Alone Type of Home: Apartment  Cognition/Observation Cognition Overall Cognitive Status: Appears within functional limits for tasks assessed  Sensation/Coordination/Flexibility/Functional Tests Functional Tests Functional Tests: sit to stand no hands 9/ 30 sec.. was 6/30 seconds  Assessment RLE Strength Right Hip Flexion: 3-/5 (was 3-/5) Right Hip Extension: 3-/5 (was 3+) Right Hip ABduction: 3-/5 (was 3-/5) Right Hip ADduction: 3-/5 Right Knee Flexion: 3+/5 (was 3/5) Right Knee Extension: 4/5 (4+/5 was 4/5) Right Ankle Dorsiflexion: 4/5 (was 4/5) LLE Strength Left Hip Flexion: 3-/5 (was 3-/5) Left Hip Extension: 3-/5 (was 3+/5) Left Hip ABduction: 3-/5  (was 2+/5) Left Hip ADduction: 3-/5 Left Knee Flexion:  (4-/5 was 4-/5) Left Knee Extension: 5/5 (was 4/5) Left Ankle Dorsiflexion: 4/5 (was 4/5)  Exercise/Treatments Mobility/Balance  Berg Balance Test Sit to Stand: Able to stand without using hands and stabilize independently Standing Unsupported: Able to stand safely 2 minutes Sitting with Back Unsupported but Feet Supported on Floor or Stool: Able to sit safely and securely 2 minutes Stand to Sit: Sits safely with minimal use of hands Transfers: Able to transfer safely, definite need of hands Standing Unsupported with Eyes Closed: Able to stand 10 seconds safely Standing Ubsupported with Feet Together: Able to place feet together independently and stand for 1 minute with supervision From Standing, Reach Forward with Outstretched Arm: Can reach forward >12 cm safely (5") From Standing Position, Pick up Object from Floor: Able to pick up shoe safely and easily From Standing Position, Turn to Look Behind Over each Shoulder: Looks behind one side only/other side shows less weight shift Turn 360 Degrees: Able to turn 360 degrees safely but slowly Standing Unsupported, Alternately Place Feet on Step/Stool: Able to complete 4 steps without aid or supervision Standing Unsupported, One Foot in Front: Able to take small step independently and hold 30 seconds Standing on One Leg: Able to lift leg independently and hold equal to or more than 3 seconds Total Score: 44   Pt ambulated x 12 min with walker Pt completed prone ex of B knee flex x 15; SL of abduction and hip ext x 10;     Physical Therapy Assessment and Plan PT Assessment and Plan Clinical Impression  Statement: Pt has approached maximal benefit of therapy.  PT verbalizes understanding of HEP and importance of completeing HEP with pt having a progressive dz process.   PT Plan: discharge patient to HEP     Goals Home Exercise Program Pt will Perform Home Exercise Program:  Independently PT Goal: Perform Home Exercise Program - Progress: Met PT Short Term Goals PT Short Term Goal 1: Pt strength to be improved 1/2 grade for patient to feel confident going sit to stand without using her hands. PT Long Term Goals PT Long Term Goal 1: Pt strength to be increased one grade to allow pt to be able to get up off the floor by herself PT Long Term Goal 1 - Progress: Not met PT Long Term Goal 2: Pt to report no falls in the past two weeks PT Long Term Goal 2 - Progress: Met Long Term Goal 3: Berg score to be improved by 10 points to allow pt to feel comfortable ambulating in the house with a cane. Sharlene Motts has improved from a 32 at initial eval to a 44/56) Long Term Goal 3 Progress: Met  Problem List Patient Active Problem List  Diagnosis  . Abnormality of gait  . Multiple sclerosis  . History of falling  . Bilateral leg weakness  . Difficulty walking    PT - End of Session Equipment Utilized During Treatment: Gait belt Activity Tolerance: Patient tolerated treatment well General Behavior During Session: [facility] for tasks performed  GP Functional Assessment Tool Used: ABC Functional Limitation: Mobility: Walking and moving around Mobility: Walking and Moving Around Current Status ([id number]): At least 40 percent but less than 60 percent impaired, limited or restricted (Pt states that she has good days and bad days.)  Orvan Papadakis,CINDY 03/25/2012, 11:07 AM  Physician Documentation Your signature is required to indicate approval of the treatment plan as stated above.  Please sign and either send electronically or make a copy of this report for your files and return this physician signed original.   Please mark one 1.__approve of plan  2. ___approve of plan with the following conditions.   ______________________________                                                          _____________________ Physician Signature                                                                                                              Date

## 2012-03-31 DIAGNOSIS — E782 Mixed hyperlipidemia: Secondary | ICD-10-CM | POA: Diagnosis not present

## 2012-03-31 DIAGNOSIS — E1165 Type 2 diabetes mellitus with hyperglycemia: Secondary | ICD-10-CM | POA: Diagnosis not present

## 2012-03-31 DIAGNOSIS — I10 Essential (primary) hypertension: Secondary | ICD-10-CM | POA: Diagnosis not present

## 2012-04-06 DIAGNOSIS — R269 Unspecified abnormalities of gait and mobility: Secondary | ICD-10-CM | POA: Diagnosis not present

## 2012-04-06 DIAGNOSIS — G35 Multiple sclerosis: Secondary | ICD-10-CM | POA: Diagnosis not present

## 2012-05-02 DIAGNOSIS — I10 Essential (primary) hypertension: Secondary | ICD-10-CM | POA: Diagnosis not present

## 2012-05-02 DIAGNOSIS — E1165 Type 2 diabetes mellitus with hyperglycemia: Secondary | ICD-10-CM | POA: Diagnosis not present

## 2012-06-30 DIAGNOSIS — I10 Essential (primary) hypertension: Secondary | ICD-10-CM | POA: Diagnosis not present

## 2012-06-30 DIAGNOSIS — M549 Dorsalgia, unspecified: Secondary | ICD-10-CM | POA: Diagnosis not present

## 2012-06-30 DIAGNOSIS — E1165 Type 2 diabetes mellitus with hyperglycemia: Secondary | ICD-10-CM | POA: Diagnosis not present

## 2012-07-18 DIAGNOSIS — N312 Flaccid neuropathic bladder, not elsewhere classified: Secondary | ICD-10-CM | POA: Diagnosis not present

## 2012-07-18 DIAGNOSIS — N3020 Other chronic cystitis without hematuria: Secondary | ICD-10-CM | POA: Diagnosis not present

## 2012-07-18 DIAGNOSIS — G35 Multiple sclerosis: Secondary | ICD-10-CM | POA: Diagnosis not present

## 2012-07-28 ENCOUNTER — Ambulatory Visit (HOSPITAL_COMMUNITY)
Admission: RE | Admit: 2012-07-28 | Discharge: 2012-07-28 | Disposition: A | Discharge status: Home / Self Care | Payer: Medicare Other | Source: Ambulatory Visit | Attending: Family Medicine | Admitting: Family Medicine

## 2012-07-28 DIAGNOSIS — E119 Type 2 diabetes mellitus without complications: Secondary | ICD-10-CM | POA: Diagnosis not present

## 2012-07-28 DIAGNOSIS — I10 Essential (primary) hypertension: Secondary | ICD-10-CM | POA: Diagnosis not present

## 2012-07-28 DIAGNOSIS — IMO0001 Reserved for inherently not codable concepts without codable children: Secondary | ICD-10-CM | POA: Diagnosis not present

## 2012-07-28 DIAGNOSIS — R269 Unspecified abnormalities of gait and mobility: Secondary | ICD-10-CM | POA: Diagnosis not present

## 2012-07-28 DIAGNOSIS — M545 Low back pain, unspecified: Secondary | ICD-10-CM | POA: Insufficient documentation

## 2012-07-28 DIAGNOSIS — M549 Dorsalgia, unspecified: Secondary | ICD-10-CM | POA: Insufficient documentation
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
## 2012-08-01 ENCOUNTER — Ambulatory Visit (HOSPITAL_COMMUNITY)
Admission: RE | Admit: 2012-08-01 | Discharge: 2012-08-01 | Disposition: A | Discharge status: Home / Self Care | Payer: Medicare Other | Source: Ambulatory Visit | Attending: Family Medicine | Admitting: Family Medicine

## 2012-08-01 NOTE — Progress Notes (Addendum)
Physical Therapy Treatment Patient Details  Name: [name] MRN: [id number] Date of Birth: [birth year]  Today's Date: 08/01/2012 Time: 7829-5621 PT Time Calculation (min): 45 min Charges: Therex x 30'(8657-8469)  Manual x 18' (0910-0928)  Visit#: 2 of 8  Re-eval: 08/27/12  Authorization: Medicare  Authorization Visit#: 2 of 10   Subjective: Symptoms/Limitations Symptoms: Pt reports HEP compliance. Pain Assessment Currently in Pain?: Yes Pain Score: 5  Pain Location: Back Pain Orientation: Right  Functional Tests Functional Tests: ODI 30%  Exercise/Treatments Supine Ab Set: 10 reps AB Set Limitations: using VC and TC to activate Heel Slides: Limitations Heel Slides Limitations: With multimodal cueing for control and form Bridge: 10 reps Straight Leg Raise: 5 reps;Limitations Straight Leg Raises Limitations: Both Other Supine Lumbar Exercises: hip adduction 10x5 sec holds Other Supine Lumbar Exercises: partical crunches 10 reps, obliques R and L 10 reps each  Manual Therapy Manual Therapy: Other (comment) Myofascial Release: MFR to right lumbar and sacral musculature Other Manual Therapy: Strain counter strain to right hip musculature to decrease spasms and tenderness  Physical Therapy Assessment and Plan PT Assessment and Plan Clinical Impression Statement: Pt displasy signficant weakness in core and hip musculatre. Tx focus on strengthening theses areas and using manual techniques to decrease pain and tenderness. Pt tends to use pant leg when raising LEs. Educated pt on becoming more aware of compensation. Pt will benefit from skilled therapeutic intervention in order to improve on the following deficits: Pain;Decreased strength;Obesity;Decreased mobility;Decreased activity tolerance Rehab Potential: Good PT Frequency: Min 2X/week PT Duration: 4 weeks PT Treatment/Interventions: Therapeutic activities;Therapeutic exercise;Neuromuscular re-education;Patient/family  education;Manual techniques PT Plan: Manual techniques to Rt gluteal region to decreae fascial restrictions.  Educated on proper posture, Continue with core exercises to improve core strength (unable to coordinate TrA due to limited feeling to core) continue with theraball isometrics and partial cruches, add piriformis and thoracolumbar stretching.  Progress towards prone opp arm/eg raises, hip adduction, hip abduction strengtheing in standing and s/l.     Problem List Patient Active Problem List   Diagnosis Date Noted  . Back pain 07/28/2012  . History of falling 01/29/2012  . Bilateral leg weakness 01/29/2012  . Difficulty walking 01/29/2012  . Abnormality of gait 01/09/2012  . Multiple sclerosis 01/09/2012    PT - End of Session Activity Tolerance: Patient tolerated treatment well General Behavior During Therapy: [facility] for tasks assessed/performed  Seth Bake, PTA 08/01/2012, 11:37 AM

## 2012-08-02 DIAGNOSIS — Z794 Long term (current) use of insulin: Secondary | ICD-10-CM | POA: Diagnosis not present

## 2012-08-02 DIAGNOSIS — E119 Type 2 diabetes mellitus without complications: Secondary | ICD-10-CM | POA: Diagnosis not present

## 2012-08-03 ENCOUNTER — Ambulatory Visit (HOSPITAL_COMMUNITY)
Admission: RE | Admit: 2012-08-03 | Discharge: 2012-08-03 | Disposition: A | Discharge status: Home / Self Care | Payer: Medicare Other | Source: Ambulatory Visit | Attending: Family Medicine | Admitting: Family Medicine

## 2012-08-03 DIAGNOSIS — M549 Dorsalgia, unspecified: Secondary | ICD-10-CM

## 2012-08-03 NOTE — Progress Notes (Signed)
Physical Therapy Treatment Patient Details  Name: [name] MRN: [id number] Date of Birth: 05/26/1955  Today's Date: 08/03/2012 Time: 4098-1191 PT Time Calculation (min): 43 min Charges: TE: [phone] Self Care: [phone] Visit#: 3 of 8  Re-eval: 08/27/12    Authorization: Medicare  Authorization Time Period:    Authorization Visit#: 3 of 10   Subjective: Symptoms/Limitations Symptoms: Pt reoprts that she is sore in her legs from the exercises and is not having back pain today.  Pain Assessment Currently in Pain?: Yes Pain Score: 3  Pain Location: Back  Precautions/Restrictions     Exercise/Treatments Supine Heel Slides: 15 reps Heel Slides Limitations: Both independently LLE Bent Knee Raise: 5 reps;Limitations Bent Knee Raise Limitations: alternating Bridge: 20 reps;Limitations Bridge Limitations: with hip adduction squezze Straight Leg Raise: 10 reps Straight Leg Raises Limitations: Both Other Supine Lumbar Exercises: hooklying hip abduction blue band 20 reps 5 sec holds; SL hip abducution x15 reps Sidelying Clam: 20 reps;Limitations Clam Limitations: Both manual faciliation Prone    Quadruped Single Arm Raise: Right;Left;5 reps Other Quadruped Lumbar Exercises: tall kneeling  w/chair for UE assistance x15     Physical Therapy Assessment and Plan PT Assessment and Plan Clinical Impression Statement: Added activities to improve core strength in all positions.  Discussed stragtegies to help patient get from the floor to standing in order to better perform exercises.  Pt continues to demonstrate significant more weakness with pain to her RLE compared to her LLE.   PT Treatment/Interventions: Therapeutic activities;Therapeutic exercise;Neuromuscular re-education;Patient/family education;Manual techniques PT Plan: Manual techniques to Rt gluteal region to decreae fascial restrictions.  Educated on proper posture, Continue with core exercises to improve core strength  (unable to coordinate TrA due to limited feeling to core) continue with theraball isometrics and partial cruches, add piriformis and thoracolumbar stretching.  Progress towards prone opp arm/eg raises, hip adduction, hip abduction strengtheing in standing and s/l.    Goals    Problem List Patient Active Problem List   Diagnosis Date Noted  . Back pain 07/28/2012  . History of falling 01/29/2012  . Bilateral leg weakness 01/29/2012  . Difficulty walking 01/29/2012  . Abnormality of gait 01/09/2012  . Multiple sclerosis 01/09/2012    PT - End of Session Activity Tolerance: Patient tolerated treatment well General Behavior During Therapy: WFL for tasks assessed/performed PT Plan of Care PT Home Exercise Plan: provided blue tband PT Patient Instructions: discussed strategies for getting from floor to stand using otoman and UE with tricep push up.  Discussed laying on carpet with foot on solid surface floor to help with reisstance with exercises.  Consulted and Agree with Plan of Care: Patient  GP    Nicolette Gieske 08/03/2012, 9:34 AM

## 2012-08-04 ENCOUNTER — Other Ambulatory Visit: Payer: Self-pay | Admitting: Neurology

## 2012-08-08 ENCOUNTER — Ambulatory Visit (HOSPITAL_COMMUNITY)
Admission: RE | Admit: 2012-08-08 | Discharge: 2012-08-08 | Disposition: A | Discharge status: Home / Self Care | Payer: Medicare Other | Source: Ambulatory Visit | Attending: Family Medicine | Admitting: Family Medicine

## 2012-08-08 NOTE — Progress Notes (Signed)
Physical Therapy Treatment Patient Details  Name: [name] MRN: [id number] Date of Birth: 1955-09-24  Today's Date: 08/08/2012 Time: 4098-1191 PT Time Calculation (min): 41 min  Visit#: 4 of 8  Re-eval: 08/27/12 Charges: Therex x 38' [phone])   Authorization: Medicare  Authorization Visit#: 4 of 10   Subjective: Symptoms/Limitations Symptoms: Pt states that is getting easier for her to get up and down from the floor to do her exercises. Pain Assessment Currently in Pain?: No/denies   Exercise/Treatments Supine Heel Slides: 10 reps Bent Knee Raise: 10 reps Bent Knee Raise Limitations: alternating Bridge: 10 reps Straight Leg Raise: 10 reps Straight Leg Raises Limitations: Both Other Supine Lumbar Exercises: hooklying crunches and diagonal reach x 10 each  Physical Therapy Assessment and Plan PT Assessment and Plan Clinical Impression Statement: Began NuStep this session to improve lower extremity strength and activity tolerance. Also began functional squats to improve functional strength. Pt displays improve core activation with crunches. Pt requires multimodal cueing to improve hip control with supine lower extremity exercises. PT Treatment/Interventions: Therapeutic activities;Therapeutic exercise;Neuromuscular re-education;Patient/family education;Manual techniques PT Plan: Manual techniques to Rt gluteal region to decreae fascial restrictions.  Educated on proper posture, Continue with core exercises to improve core strength (unable to coordinate TrA due to limited feeling to core) continue with theraball isometrics and partial cruches, add piriformis and thoracolumbar stretching.  Progress towards prone opp arm/eg raises, hip adduction, hip abduction strengtheing in standing and s/l.     Problem List Patient Active Problem List   Diagnosis Date Noted  . Back pain 07/28/2012  . History of falling 01/29/2012  . Bilateral leg weakness 01/29/2012  . Difficulty  walking 01/29/2012  . Abnormality of gait 01/09/2012  . Multiple sclerosis 01/09/2012    PT - End of Session Activity Tolerance: Patient tolerated treatment well General Behavior During Therapy: [facility] for tasks assessed/performed  Seth Bake, PTA  08/08/2012, 10:16 AM

## 2012-08-10 ENCOUNTER — Ambulatory Visit (HOSPITAL_COMMUNITY)
Admission: RE | Admit: 2012-08-10 | Discharge: 2012-08-10 | Disposition: A | Discharge status: Home / Self Care | Payer: Medicare Other | Source: Ambulatory Visit | Attending: Family Medicine | Admitting: Family Medicine

## 2012-08-10 NOTE — Progress Notes (Signed)
Physical Therapy Treatment Patient Details  Name: [name] MRN: [id number] Date of Birth: 1956-01-10  Today's Date: 08/10/2012 Time: 4098-1191 PT Time Calculation (min): 45 min  Visit#: 5 of 8  Re-eval: 08/27/12 Charges: Therex x 43' [phone])  Authorization: Medicare  Authorization Visit#: 5 of 10   Subjective: Symptoms/Limitations Symptoms: Pt states that she has been working very hard on her exercises at home. Pain Assessment Currently in Pain?: No/denies   Exercise/Treatments Aerobic Stationary Bike: NuStep 10'L2 Le only to improve stregnth and activity tolerance Supine Heel Slides: 10 reps Bent Knee Raise: 10 reps Bent Knee Raise Limitations: alternating Bridge: 20 reps Bridge Limitations: 10x w/add squeeze 10xw/o Straight Leg Raise: 10 reps Straight Leg Raises Limitations: Both  Physical Therapy Assessment and Plan PT Assessment and Plan Clinical Impression Statement: Pt tolerates increased time on NuStep. Pt displays gradual improvements in hip stability and strength. Pt requires multimodal cueing to facilitate adductors and avoid external rotation with supine exercises. VC's required to improve heel-toe pattern. PT Treatment/Interventions: Therapeutic activities;Therapeutic exercise;Neuromuscular re-education;Patient/family education;Manual techniques PT Plan: Manual techniques to Rt gluteal region to decreae fascial restrictions.  Educated on proper posture, Continue with core exercises to improve core strength (unable to coordinate TrA due to limited feeling to core) continue with theraball isometrics and partial cruches, add piriformis and thoracolumbar stretching.  Progress towards prone opp arm/eg raises, hip adduction, hip abduction strengtheing in standing and s/l.     Problem List Patient Active Problem List   Diagnosis Date Noted  . Back pain 07/28/2012  . History of falling 01/29/2012  . Bilateral leg weakness 01/29/2012  . Difficulty walking  01/29/2012  . Abnormality of gait 01/09/2012  . Multiple sclerosis 01/09/2012    PT - End of Session Activity Tolerance: Patient tolerated treatment well General Behavior During Therapy: [facility] for tasks assessed/performed  Seth Bake, PTA  08/10/2012, 9:39 AM

## 2012-08-15 ENCOUNTER — Ambulatory Visit (HOSPITAL_COMMUNITY)
Admission: RE | Admit: 2012-08-15 | Discharge: 2012-08-15 | Disposition: A | Discharge status: Home / Self Care | Payer: Medicare Other | Source: Ambulatory Visit | Attending: Family Medicine | Admitting: Family Medicine

## 2012-08-15 NOTE — Progress Notes (Signed)
Physical Therapy Treatment Patient Details  Name: [name] MRN: [id number] Date of Birth: Jul 05, 1955  Today's Date: 08/15/2012 Time: 4098-1191 PT Time Calculation (min): 45 min  Visit#: 6 of 8  Re-eval: 08/27/12 Charges: Therex x [phone])  Authorization: Medicare  Authorization Visit#: 6 of 10   Subjective: Symptoms/Limitations Symptoms: Pt reports continued HEP compliance. Pt states that her pain has significantly decreased. Pain Assessment Currently in Pain?: No/denies   Exercise/Treatments Aerobic Stationary Bike: NuStep 10' L2 LE only to improve stregnth and activity tolerance at end of session Standing Functional Squats: 15 reps Wall Slides: 10 reps Supine Heel Slides: 15 reps Bent Knee Raise: 10 reps Bridge: 15 reps;5 seconds;Limitations Bridge Limitations: w/adductor ball squeeze Straight Leg Raise: 10 reps Straight Leg Raises Limitations: Both Other Supine Lumbar Exercises: Rolling R/L with multimodal cueing for technique Sidelying Hip Abduction: 10 reps  Physical Therapy Assessment and Plan PT Assessment and Plan Clinical Impression Statement: Pt continues to displays improvements in hip stability and strength. Pt is now able to competed side lying hip abduction which she has been unable to do in previous sessions. Pt also displays improvements in activity tolerance. PT Treatment/Interventions: Therapeutic activities;Therapeutic exercise;Neuromuscular re-education;Patient/family education;Manual techniques PT Plan: Manual techniques to Rt gluteal region to decreae fascial restrictions.  Educated on proper posture, Continue with core exercises to improve core strength (unable to coordinate TrA due to limited feeling to core) continue with theraball isometrics and partial cruches, add piriformis and thoracolumbar stretching.  Progress towards prone opp arm/eg raises, hip adduction, hip abduction strengtheing in standing and s/l.     Problem  List Patient Active Problem List   Diagnosis Date Noted  . Back pain 07/28/2012  . History of falling 01/29/2012  . Bilateral leg weakness 01/29/2012  . Difficulty walking 01/29/2012  . Abnormality of gait 01/09/2012  . Multiple sclerosis 01/09/2012    PT - End of Session Activity Tolerance: Patient tolerated treatment well General Behavior During Therapy: [facility] for tasks assessed/performed  Seth Bake, PTA  08/15/2012, 12:03 PM

## 2012-08-17 ENCOUNTER — Ambulatory Visit (HOSPITAL_COMMUNITY)
Admission: RE | Admit: 2012-08-17 | Discharge: 2012-08-17 | Disposition: A | Discharge status: Home / Self Care | Payer: Medicare Other | Source: Ambulatory Visit | Attending: Family Medicine | Admitting: Family Medicine

## 2012-08-17 NOTE — Progress Notes (Signed)
Physical Therapy Treatment Patient Details  Name: [name] MRN: [id number] Date of Birth: Jun 17, 1955  Today's Date: 08/17/2012 Time: 7829-5621 PT Time Calculation (min): 42 min  Visit#: 7 of 8  Re-eval: 08/27/12 Charges: Therex x 38' [phone])  Authorization: Medicare   Authorization Visit#: 7 of 10   Subjective: Symptoms/Limitations Symptoms: Pt states that she is very tired today. Pain Assessment Currently in Pain?: No/denies   Exercise/Treatments Aerobic Stationary Bike: NuStep 10' L2 LE only to improve stregnth and activity tolerance at end of session Standing Heel Raises: 15 reps;Limitations Heel Raises Limitations: Toe raises x 15 Functional Squats: 15 reps Wall Slides: 10 reps Other Standing Lumbar Exercises: Rocker board x 2' R/L Supine Bent Knee Raise: 10 reps Bridge: 20 reps Bridge Limitations: w/adductor ball squeeze Straight Leg Raise: 10 reps Straight Leg Raises Limitations: Both  Physical Therapy Assessment and Plan PT Assessment and Plan Clinical Impression Statement: Pt is somewhat limited by fatigue this session. Pt requires one seated rest break during standing activities. Pt requires frequent suing throughout session to avoid dragging feet when walking. Pt tolerates increased resistance on NuStep with minimal difficulty. PT Treatment/Interventions: Therapeutic activities;Therapeutic exercise;Neuromuscular re-education;Patient/family education;Manual techniques PT Plan: Reassess next session.     Problem List Patient Active Problem List   Diagnosis Date Noted  . Back pain 07/28/2012  . History of falling 01/29/2012  . Bilateral leg weakness 01/29/2012  . Difficulty walking 01/29/2012  . Abnormality of gait 01/09/2012  . Multiple sclerosis 01/09/2012    PT - End of Session Activity Tolerance: Patient tolerated treatment well General Behavior During Therapy: [facility] for tasks assessed/performed  Seth Bake, PTA  08/17/2012, 9:42  AM

## 2012-08-22 ENCOUNTER — Ambulatory Visit (HOSPITAL_COMMUNITY)
Admission: RE | Admit: 2012-08-22 | Discharge: 2012-08-22 | Disposition: A | Discharge status: Home / Self Care | Payer: Medicare Other | Source: Ambulatory Visit | Attending: Family Medicine | Admitting: Family Medicine

## 2012-08-22 NOTE — Evaluation (Signed)
Physical Therapy Discharge Patient Details  Name: [name] MRN: [id number] Date of Birth: 1956/01/14  Today's Date: 08/22/2012 Time: 0850-0930 PT Time Calculation (min): 40 min Charges: MMT x 1 [phone]) Self care x 20' (0910-0930)              Visit#: 8 of 8  Re-eval: 08/27/12 Assessment Diagnosis: LBP Next MD Visit: Dr. Elder Cyphers - September 8  Authorization: Medicare    Authorization Visit#: 8 of 10   Past Medical History:  Past Medical History  Diagnosis Date  . Diabetes mellitus without complication   . Hypertension   . Morbid obesity   . High cholesterol   . MS (multiple sclerosis) relapsing remitting   Past Surgical History:  Past Surgical History  Procedure Laterality Date  . Cholecystectomy    . Left oophorectomy      Subjective Symptoms/Limitations Symptoms: My back is much better. Pain Assessment Currently in Pain?: No/denies   Sensation/Coordination/Flexibility/Functional Tests Functional Tests Functional Tests: ODI 14% (was 30%)  Assessment RLE Strength Right Hip Flexion: 4/5 (was 3-/5) Right Hip Extension: 3+/5 (was 2+/5) Right Hip ABduction: 4/5 (was 3/5) Right Hip ADduction: 4/5 (was 4/5) LLE Strength Left Hip Flexion: 4/5 (was 3/5) Left Hip Extension: 3+/5 (was 3-/5) Left Hip ABduction: 4/5 (was 3-/5) Left Hip ADduction: 4/5 (was 3-/5) Lumbar AROM Lumbar Flexion: WNL (was WNL) Lumbar Extension: WNL (was with pain) Lumbar - Right Side Bend: WNL (was with pain) Lumbar - Left Side Bend: WNL (was WNL)  Exercise/Treatments Mobility/Balance  Ambulation/Gait Assistive device: Rolling walker Posture/Postural Control Postural Limitations: Pt is able to display correct posture in sitting   Aerobic Stationary Bike: NuStep 10' L2 LE only to improve stregnth and activity tolerance at end of session  Physical Therapy Assessment and Plan PT Assessment and Plan Clinical Impression Statement: Pt has progressed well with therapy. Pt is  no longer limited by back pain. Strength and activity tolerance has also improved significantly. ODI improved 16%. Pt is independent and compliant with HEP. Pt is comfortable with D/C. PT Treatment/Interventions: Therapeutic activities;Therapeutic exercise;Neuromuscular re-education;Patient/family education;Manual techniques PT Plan: Recommend D/C to HEP.    Goals PT Short Term Goals Time to Complete Short Term Goals: 2 weeks PT Short Term Goal 1: Pt will report low back pain less than 4/10 for 50% of her day.  PT Short Term Goal 1 - Progress: Met PT Short Term Goal 2: Pt will improve her BLE strength by 1 muscle grade for greater ease with sit to stand activities.  PT Short Term Goal 2 - Progress: Met PT Short Term Goal 3: Pt will decreae her Rt gluteal and lumbar fascial restrictions to moderate for less pain with lumbar AROM activtiies.  PT Long Term Goals Time to Complete Long Term Goals: 4 weeks PT Long Term Goal 1: Pt will improve her BLE strength and core strength to [facility] in order to tolerate ambulation with her RW x15 minutes to continue with exercise and weight control.  PT Long Term Goal 1 - Progress: Met PT Long Term Goal 2: Pt will improve her body awareness and demonstrate proper seated posture with min cueing x10 minutes.  PT Long Term Goal 2 - Progress: Met Long Term Goal 3: Pt will have minimal fascial restrictions to gluteal and lumbar area in order to report pain less than 3/10 for 75% of her day.  Long Term Goal 3 Progress: Met Long Term Goal 4: Pt will score less than 40% on her ODI for improved  percieved functional ability.  Long Term Goal 4 Progress: Met  Problem List Patient Active Problem List   Diagnosis Date Noted  . Back pain 07/28/2012  . History of falling 01/29/2012  . Bilateral leg weakness 01/29/2012  . Difficulty walking 01/29/2012  . Abnormality of gait 01/09/2012  . Multiple sclerosis 01/09/2012    PT - End of Session Activity Tolerance: Patient  tolerated treatment well General Behavior During Therapy: WFL for tasks assessed/performed  GP Functional Assessment Tool Used: clinical observation and ODI Functional Limitation: Self care Self Care Goal Status (W0981): At least 20 percent but less than 40 percent impaired, limited or restricted Self Care Discharge Status [phone]): At least 1 percent but less than 20 percent impaired, limited or restricted  Seth Bake, PTA  08/22/2012, 9:43 AM  Physician Documentation Your signature is required to indicate approval of the treatment plan as stated above.  Please sign and either send electronically or make a copy of this report for your files and return this physician signed original.   Please mark one 1.__approve of plan  2. ___approve of plan with the following conditions.   ______________________________                                                          _____________________ Physician Signature                                                                                                             Date

## 2012-08-24 ENCOUNTER — Ambulatory Visit (HOSPITAL_COMMUNITY): Payer: Medicare Other | Admitting: *Deleted

## 2012-09-29 DIAGNOSIS — I10 Essential (primary) hypertension: Secondary | ICD-10-CM | POA: Diagnosis not present

## 2012-09-29 DIAGNOSIS — E782 Mixed hyperlipidemia: Secondary | ICD-10-CM | POA: Diagnosis not present

## 2012-09-29 DIAGNOSIS — E1129 Type 2 diabetes mellitus with other diabetic kidney complication: Secondary | ICD-10-CM | POA: Diagnosis not present

## 2012-10-07 ENCOUNTER — Encounter: Payer: Self-pay | Admitting: Neurology

## 2012-10-11 ENCOUNTER — Ambulatory Visit (INDEPENDENT_AMBULATORY_CARE_PROVIDER_SITE_OTHER): Payer: Medicare Other | Admitting: Neurology

## 2012-10-11 ENCOUNTER — Encounter: Payer: Self-pay | Admitting: Neurology

## 2012-10-11 VITALS — BP 117/81 | HR 93 | Resp 18 | Ht 65.0 in | Wt 275.0 lb

## 2012-10-11 DIAGNOSIS — M25559 Pain in unspecified hip: Secondary | ICD-10-CM | POA: Diagnosis not present

## 2012-10-11 DIAGNOSIS — N319 Neuromuscular dysfunction of bladder, unspecified: Secondary | ICD-10-CM

## 2012-10-11 DIAGNOSIS — M25552 Pain in left hip: Secondary | ICD-10-CM

## 2012-10-11 DIAGNOSIS — G35 Multiple sclerosis: Secondary | ICD-10-CM | POA: Diagnosis not present

## 2012-10-11 HISTORY — DX: Morbid (severe) obesity due to excess calories: E66.01

## 2012-10-11 MED ORDER — BACLOFEN 10 MG PO TABS: 10.0000 mg | ORAL_TABLET | Freq: Three times a day (TID) | ORAL | Status: DC

## 2012-10-11 MED ORDER — TOLTERODINE TARTRATE ER 4 MG PO CP24: 4.0000 mg | ORAL_CAPSULE | Freq: Every day | ORAL | Status: DC

## 2012-10-11 MED ORDER — IMIPRAMINE HCL 25 MG PO TABS: 25.0000 mg | ORAL_TABLET | Freq: Every day | ORAL | Status: DC

## 2012-10-11 NOTE — Patient Instructions (Signed)
Multiple Sclerosis Multiple sclerosis (MS) is a disease of the central nervous system. Its cause is unknown. It is more common in the northern states than in the southern states. There is a higher incidence of MS in women. There is a wide variation in the symptoms (problems) of MS. This is because of the many different ways it affects the central nervous system. It often comes on in episodes or attacks. These attacks may last weeks to months. There may be long periods of nearly no problems between attacks. The main symptoms include visual problems (associated with eye pain), numbness, weakness, and paralysis in extremities (arms/hands and legs/feet). There may also be tremors and problems with balance and walking. The age when MS starts is variable. Advances in medicine continue to improve the treatment of this illness. There is no known cure for MS but there are medications that help. MS is not an inherited illness, although your risk of getting this disease is higher if you have a relative with MS. The best radiologic (x-ray) study for MS is an MRI (magnetic resonance imaging). There are medications available to decrease the number and frequency of attacks. SYMPTOMS  The symptoms of MS are caused by loss of insulation (myelin) of the nerves of the brain. When this happens, brain signals do not get transmitted properly or may not get transmitted at all. Some of the problems caused by this include:   Numbness.  Weakness.  Paralysis in extremities.  Visual problems, eye pain.  Balance problems.  Tremors. DIAGNOSIS  Your caregiver can do studies on you to make this diagnosis. This may include specialized X-rays and spinal fluid studies. HOME CARE INSTRUCTIONS   Take medications as directed by your caregiver. Baclofen is a drug commonly used to reduce muscle spasticity. Steroids are often used for short term relief.  Exercise as directed.  Use physical and occupational therapy as directed by  your caregiver. Careful attention to this medical care can help avoid depression.  See your caregiver if you begin to have problems with depression. This is a common problem in MS. Patients often continue to work many years after the diagnosis of MS. Document Released: 01/10/2000 Document Revised: 04/06/2011 Document Reviewed: 08/18/2006 [employer] Patient Information 2014 [employer].  

## 2012-10-11 NOTE — Progress Notes (Signed)
[facility]  Provider:  Melvyn Novas, M D  Referring Provider: [name], MD Primary Care Physician:  [name], MD  Chief Complaint  Patient presents with  . Follow-up    MS,rm 10    HPI:  [name] is a 57 y.o. female  Is seen here as a revisit  from Dr. Kevan Ny for multiple sclerosis follow up.   The patient had followed [name] for over 20 years , diagnosed since 1989 ,, and is now mainly seen by [name], NP.  In my last visit , 2013 , I suggested Ampyra to her , which we than initiated but had to D/C after her kidney function changed. The patient is diabetic, diagnosed in 2008. She just changed in 2014 to insulin. She remains on Betaseron. Has been gaining weight and has had a fall last December. Uses a Rollator when she is outdoors but indoors a walking stick or cane.    She has a chief complaint today of  cramping pain in the left leg, spasm, at the groin and right above the hip. No radiation , no burning and no deep ache - just spams.  Onset 3 weeks ago.  Although, she has taken baclofen po for a long time , for the " MS hug" - it had an effect only on abdominal spasms. On the same dose, it has not affected the hip spasms.  She believes her sleep is poor due to spasms and nocturia, she may snore. Wakes with a dry mouth, has been told she snores. Sleeps prone.  The spasms in the left hip are preceded by sitting in certain low chairs in [employer] , her work place.    She needs an EMG and NCS of the left extremity and high dose of baclofen.  .     Dr. Kevan Ny recently did some routine Labs, that are not available on EPIC.       Review of Systems: Out of a complete 14 system review, the patient complains of only the following symptoms, and all other reviewed systems are negative.  Hip spasm, pain , non radiating , weight gain, fatigue,  Gait disorder, falls , urinary incontinence, nocturia.    History   Social History  .  Marital Status: Single    Spouse Name: N/A    Number of Children: 0  . Years of Education: college   Occupational History  . disabled    Social History Main Topics  . Smoking status: Never Smoker   . Smokeless tobacco: Never Used  . Alcohol Use: No     Comment: quit in 1975  . Drug Use: Not on file  . Sexual Activity: Not on file   Other Topics Concern  . Not on file   Social History Narrative  . No narrative on file    Family History  Problem Relation Age of Onset  . Heart attack Mother   . Diabetes Mother   . Aneurysm Father   . Heart disease Father   . Cancer Maternal Grandmother     Past Medical History  Diagnosis Date  . Diabetes mellitus without complication   . Hypertension   . Morbid obesity   . High cholesterol   . MS (multiple sclerosis) relapsing remitting    Past Surgical History  Procedure Laterality Date  . Cholecystectomy  8/09  . Left oophorectomy Left 8/11    Current Outpatient Prescriptions  Medication Sig Dispense Refill  . aspirin  81 MG tablet Take 81 mg by mouth daily.      Marland Kitchen atorvastatin (LIPITOR) 20 MG tablet Take 20 mg by mouth daily.      . baclofen (LIORESAL) 10 MG tablet TAKE 1 TABLET TWICE A DAY  180 tablet  1  . imipramine (TOFRANIL) 25 MG tablet Take 25 mg by mouth 2 (two) times daily.      . insulin aspart (NOVOLOG PENFILL) 100 UNIT/ML SOCT cartridge Inject into the skin daily.      . interferon beta-1b (BETASERON) 0.3 MG injection Inject 0.3 mg into the skin every other day.      Marland Kitchen JANUVIA 50 MG tablet       . LANTUS SOLOSTAR 100 UNIT/ML SOPN       . metoprolol tartrate (LOPRESSOR) 25 MG tablet Take 25 mg by mouth daily.      . nitrofurantoin (MACRODANTIN) 100 MG capsule Take 100 mg by mouth. 1 po every other day      . pioglitazone (ACTOS) 30 MG tablet Take 30 mg by mouth daily.      . Tamsulosin HCl (FLOMAX) 0.4 MG CAPS Take 0.4 mg by mouth.      . telmisartan (MICARDIS) 20 MG tablet Take by mouth daily.      Marland Kitchen  trimethoprim (TRIMPEX) 100 MG tablet Take 100 mg by mouth. One po every other day.       No current facility-administered medications for this visit.    Allergies as of 10/11/2012 - Review Complete 10/11/2012  Allergen Reaction Noted  . Penicillins  10/07/2011    Vitals: BP 117/81  Pulse 93  Resp 18  Ht 5\' 5"  (1.651 m)  Wt 275 lb (124.739 kg)  BMI 45.76 kg/m2 Last Weight:  Wt Readings from Last 1 Encounters:  10/11/12 275 lb (124.739 kg)   Last Height:   Ht Readings from Last 1 Encounters:  10/11/12 5\' 5"  (1.651 m)    Physical exam:  General: The patient is awake, alert and appears not in acute distress. The patient is well groomed. Head: Normocephalic, atraumatic. Neck is supple. Mallampati 4 , neck circumference: 17 inches , no retrognathia, no retainers, no TMJ, no nasal deviation  Cardiovascular:  Regular rate and rhythm, without  murmurs or carotid bruit, and without distended neck veins. Respiratory: Lungs are clear to auscultation. Skin:  Without evidence of edema, or rash Trunk: BMI is  elevated .  Neurologic exam : The patient is awake and alert, oriented to place and time.  Memory subjective described as intact. There is a normal attention span & concentration ability.  Speech is fluent without  dysarthria, dysphonia or aphasia. Mood and affect are appropriate.  Cranial nerves: Pupils are equal and briskly reactive to light. Funduscopic exam without  evidence of pallor or edema. Extraocular movements  in vertical and horizontal planes intact and without nystagmus. Visual fields by finger perimetry are intact. Hearing to finger rub intact.  Facial sensation intact to fine touch. Facial motor strength is symmetric and tongue and uvula move midline.  Motor exam: Normal tone and normal muscle bulk and symmetric normal strength in all extremities.  Sensory:  Fine touch, pinprick and vibration were tested in all extremities. Proprioception in upper extremities is  normal.  Coordination: Rapid alternating movements in the fingers/hands is tested and normal. Finger-to-nose maneuver tested and normal without evidence of ataxia, dysmetria or tremor. The patient has trouble lifting the right foot and adducting/ flexing the right hip. She places her left  leg first on the stool before she tries to sit up at the exam table, She is unable to turn, unable to rise without bracing herself,   Patient walks with assistive device.  Deep tendon reflexes: in the  upper and lower extremities are symmetric, brisk  and intact. Babinski maneuver response is up going right, and down going left .   Assessment:  MS relapsing - remitting , but without relapse over 10 years . May enter the  progressive phase of the disease.  She will remain on Betaseron, not oral medication.  Fatigue due to MS or sleepiness from nocturia and apnea?   Baclofen now 10 mg tid. Spasm are most bother some at night.  She can distribute the medication as she pleases and needs.  PT re - evaluation , fall prevention was just recently completed, before the pain started. .  EMG and NCS with Dr. Terrace Arabia or  Dr. Anne Hahn.  PSG split order, patient will need to wear nocturia a pads.  Her urologist is Dr. Patsi Sears.

## 2012-10-21 ENCOUNTER — Encounter (INDEPENDENT_AMBULATORY_CARE_PROVIDER_SITE_OTHER): Payer: Self-pay

## 2012-10-21 ENCOUNTER — Ambulatory Visit (INDEPENDENT_AMBULATORY_CARE_PROVIDER_SITE_OTHER): Payer: Medicare Other | Admitting: Neurology

## 2012-10-21 DIAGNOSIS — M25552 Pain in left hip: Secondary | ICD-10-CM

## 2012-10-21 DIAGNOSIS — M79609 Pain in unspecified limb: Secondary | ICD-10-CM | POA: Diagnosis not present

## 2012-10-21 DIAGNOSIS — G35 Multiple sclerosis: Secondary | ICD-10-CM

## 2012-10-21 DIAGNOSIS — N319 Neuromuscular dysfunction of bladder, unspecified: Secondary | ICD-10-CM

## 2012-10-21 NOTE — Procedures (Signed)
  HISTORY:  [name] is a 57 year old patient with a history of multiple sclerosis associated with a gait disorder, bilateral lower extremity weakness and paresthesias. Within the last 3 weeks, the patient has developed "leg cramps" involving the left leg. The patient has a triple flexion response with the left leg that is occurring frequently. The patient is being evaluated for this issue. The patient reports some pain in the upper thigh on the left with the episodes. Occasionally, the left leg will stiffen into an extended position.  NERVE CONDUCTION STUDIES:  Nerve conduction studies were performed on both lower extremities. The distal motor latencies and motor amplitudes for the peroneal and posterior tibial nerves were within normal limits. The nerve conduction velocities for these nerves were also normal. The H reflex latencies were absent bilaterally. The sensory latencies for the peroneal nerves were within normal limits.   EMG STUDIES:  EMG study was performed on the left lower extremity:  The tibialis anterior muscle reveals 2 to 4K motor units with full recruitment. No fibrillations or positive waves were seen. The peroneus tertius muscle reveals 2 to 4K motor units with full recruitment. No fibrillations or positive waves were seen. The medial gastrocnemius muscle reveals 1 to 3K motor units with full recruitment. No fibrillations or positive waves were seen. The vastus lateralis muscle reveals 2 to 4K motor units with full recruitment. No fibrillations or positive waves were seen. The iliopsoas muscle reveals 2 to 4K motor units with full recruitment. No fibrillations or positive waves were seen. The biceps femoris muscle (long head) reveals 2 to 4K motor units with full recruitment. No fibrillations or positive waves were seen. The lumbosacral paraspinal muscles were tested at 3 levels, and revealed no abnormalities of insertional activity at all 3 levels tested. There was fair  relaxation.   IMPRESSION:  Nerve conduction studies of the lower extremities were relatively unremarkable, without evidence of a peripheral neuropathy. EMG evaluation of the left lower extremity was within normal limits, no evidence of a left lumbosacral radiculopathy is seen.  Marlan Palau MD 10/21/2012 11:06 AM  [facility] [address] [place], [place] [address]  Phone [phone] Fax [phone]

## 2012-10-24 ENCOUNTER — Ambulatory Visit (INDEPENDENT_AMBULATORY_CARE_PROVIDER_SITE_OTHER): Payer: Medicare Other | Admitting: Neurology

## 2012-10-24 ENCOUNTER — Encounter: Payer: Medicare Other | Admitting: Neurology

## 2012-10-24 DIAGNOSIS — M25552 Pain in left hip: Secondary | ICD-10-CM

## 2012-10-24 DIAGNOSIS — G4761 Periodic limb movement disorder: Secondary | ICD-10-CM

## 2012-10-24 DIAGNOSIS — G4733 Obstructive sleep apnea (adult) (pediatric): Secondary | ICD-10-CM | POA: Diagnosis not present

## 2012-10-24 DIAGNOSIS — G35 Multiple sclerosis: Secondary | ICD-10-CM

## 2012-10-26 ENCOUNTER — Telehealth: Payer: Self-pay | Admitting: Neurology

## 2012-10-27 DIAGNOSIS — Z23 Encounter for immunization: Secondary | ICD-10-CM | POA: Diagnosis not present

## 2012-10-31 NOTE — Telephone Encounter (Signed)
Called patient and gave normal results.

## 2012-11-06 ENCOUNTER — Telehealth: Payer: Self-pay | Admitting: *Deleted

## 2012-11-06 DIAGNOSIS — G4733 Obstructive sleep apnea (adult) (pediatric): Secondary | ICD-10-CM

## 2012-11-06 NOTE — Telephone Encounter (Signed)
Left message for patient regarding sleep study results, asked patient to call me back to discuss results and have questions answered.  Explained that a copy of the sleep study was sent to referring physician and copy of study is coming to them in the mail. Asked pt to call back and speak to [place] on Monday or she can ask for [place] on Tuessday. -sh

## 2012-11-07 ENCOUNTER — Encounter: Payer: Self-pay | Admitting: *Deleted

## 2012-11-09 ENCOUNTER — Telehealth: Payer: Self-pay | Admitting: Neurology

## 2012-11-09 ENCOUNTER — Encounter: Payer: Self-pay | Admitting: *Deleted

## 2012-11-09 NOTE — Telephone Encounter (Signed)
Pt called back and we discussed the results of her sleep study.  She understands the reason we were not able to do a Split Night study and she understands the reason the doctor would like her to come back in for a titration.  She will not be back in town until November 1.  We were able to schedule her study on Monday, November 3 at 8:00 PM.  She would like a letter reminder of her appt which I will send today.  A copy of the results have already been forwarded to her. -sh

## 2012-11-10 ENCOUNTER — Telehealth: Payer: Self-pay | Admitting: Neurology

## 2012-11-10 MED ORDER — PREDNISONE 10 MG PO TABS: 10.0000 mg | ORAL_TABLET | Freq: Every day | ORAL | Status: DC

## 2012-11-10 NOTE — Telephone Encounter (Signed)
She is an active patient here /  She can use  Prednisone 10 mg tabs , I filled for 45 and she will need to pick tham up at the pharmacy.

## 2012-11-10 NOTE — Telephone Encounter (Signed)
spoke with patient and she said that she is unable to walk,legs are weak,has taken prednisone 10-15 years ago and it worked, would like a prescription if possible.She is in [place] and will not be back until Nov 1st.

## 2012-11-10 NOTE — Telephone Encounter (Signed)
I called and spoke to pt and let her know that Dr. Vickey Huger Escribed  prescription for her to [facility] in [place].  I called and spoke to [facility] in [place] and clarified directions.  Then called and spoke to [facility] in [place], [place] and she did see prescription.  [phone].  I gave her pts phone #  [phone] if questions.

## 2012-11-10 NOTE — Addendum Note (Signed)
Addended by: Melvyn Novas on: 11/10/2012 03:33 PM   Modules accepted: Orders

## 2012-11-10 NOTE — Telephone Encounter (Addendum)
    Spoke with patient and she said that she is unable to walk, legs are weak, has taken prednisone 10-15 years ago and it worked.  She would like a prescription if possible.  She is in [place] and will not return until Nov. 1st.

## 2012-12-11 ENCOUNTER — Ambulatory Visit (INDEPENDENT_AMBULATORY_CARE_PROVIDER_SITE_OTHER): Payer: Medicare Other

## 2012-12-11 DIAGNOSIS — G4733 Obstructive sleep apnea (adult) (pediatric): Secondary | ICD-10-CM | POA: Diagnosis not present

## 2012-12-11 DIAGNOSIS — G2581 Restless legs syndrome: Secondary | ICD-10-CM | POA: Diagnosis not present

## 2012-12-11 DIAGNOSIS — E662 Morbid (severe) obesity with alveolar hypoventilation: Secondary | ICD-10-CM | POA: Diagnosis not present

## 2012-12-21 ENCOUNTER — Other Ambulatory Visit: Payer: Self-pay | Admitting: *Deleted

## 2012-12-21 DIAGNOSIS — G4733 Obstructive sleep apnea (adult) (pediatric): Secondary | ICD-10-CM

## 2012-12-21 DIAGNOSIS — E662 Morbid (severe) obesity with alveolar hypoventilation: Secondary | ICD-10-CM

## 2012-12-26 ENCOUNTER — Telehealth: Payer: Self-pay | Admitting: Neurology

## 2012-12-26 NOTE — Telephone Encounter (Signed)
I called and left a message for the patient about her sleep study results.  I informed the patient that her sleep study did reveal the diagnosis of obstructive sleep apnea, Restless leg syndrome. I informed the patient that Dr. Vickey Huger recommends CPAP therapy at home and I will fax the order to [facility] and they will contact her to set-up an appointment. I will mail a copy to her and fax a copy to Dr. Louie Casa office.

## 2012-12-27 ENCOUNTER — Encounter: Payer: Self-pay | Admitting: *Deleted

## 2013-01-02 ENCOUNTER — Telehealth: Payer: Self-pay | Admitting: Neurology

## 2013-01-02 DIAGNOSIS — E1129 Type 2 diabetes mellitus with other diabetic kidney complication: Secondary | ICD-10-CM | POA: Diagnosis not present

## 2013-01-02 DIAGNOSIS — I10 Essential (primary) hypertension: Secondary | ICD-10-CM | POA: Diagnosis not present

## 2013-01-02 DIAGNOSIS — E782 Mixed hyperlipidemia: Secondary | ICD-10-CM | POA: Diagnosis not present

## 2013-01-02 NOTE — Telephone Encounter (Signed)
Patient calling to check on the status of her long term disability form and if it has been sent already. Please call.

## 2013-01-03 NOTE — Telephone Encounter (Signed)
I called patient and let her know I received her forms on the 3rd of December. It will take about two weeks to complete.

## 2013-01-12 ENCOUNTER — Telehealth: Payer: Self-pay

## 2013-01-12 NOTE — Telephone Encounter (Signed)
Left VM. Forms completed, faxed and mailed.

## 2013-01-17 DIAGNOSIS — N312 Flaccid neuropathic bladder, not elsewhere classified: Secondary | ICD-10-CM | POA: Diagnosis not present

## 2013-01-17 DIAGNOSIS — R339 Retention of urine, unspecified: Secondary | ICD-10-CM | POA: Diagnosis not present

## 2013-01-17 DIAGNOSIS — N3020 Other chronic cystitis without hematuria: Secondary | ICD-10-CM | POA: Diagnosis not present

## 2013-01-17 DIAGNOSIS — G35 Multiple sclerosis: Secondary | ICD-10-CM | POA: Diagnosis not present

## 2013-02-06 ENCOUNTER — Telehealth: Payer: Self-pay | Admitting: Neurology

## 2013-02-06 ENCOUNTER — Other Ambulatory Visit: Payer: Self-pay

## 2013-02-06 DIAGNOSIS — G35 Multiple sclerosis: Secondary | ICD-10-CM

## 2013-02-06 DIAGNOSIS — G35D Multiple sclerosis, unspecified: Secondary | ICD-10-CM

## 2013-02-06 DIAGNOSIS — M25552 Pain in left hip: Secondary | ICD-10-CM

## 2013-02-06 MED ORDER — BACLOFEN 10 MG PO TABS: 10.0000 mg | ORAL_TABLET | Freq: Three times a day (TID) | ORAL | Status: DC

## 2013-02-06 NOTE — Telephone Encounter (Signed)
[name] with [facility] [phone] has questions concerning signature on prescription - he said it does not look like Dr. Oliva Bustardohmeier's. Please call.

## 2013-02-07 NOTE — Telephone Encounter (Signed)
I do not see that we have sent anything to [facility].  I called back.  Spoke with [facility] Company[facility]indy.  [facility]  He said they just needed to verify if the patient was seeing Dr Vickey Hugerohmeier, and I advised she has been seeing both Dr Vickey Hugerohmeier and NP Darrol Angelarolyn Martin.  They will note the chart.  Nothing further is needed at this time.

## 2013-02-10 ENCOUNTER — Encounter: Payer: Self-pay | Admitting: Neurology

## 2013-02-15 ENCOUNTER — Ambulatory Visit: Payer: Medicare Other | Admitting: Nurse Practitioner

## 2013-02-17 ENCOUNTER — Encounter: Payer: Self-pay | Admitting: Neurology

## 2013-02-27 ENCOUNTER — Telehealth: Payer: Self-pay | Admitting: Neurology

## 2013-02-27 DIAGNOSIS — N319 Neuromuscular dysfunction of bladder, unspecified: Secondary | ICD-10-CM

## 2013-02-27 DIAGNOSIS — G35 Multiple sclerosis: Secondary | ICD-10-CM

## 2013-02-27 MED ORDER — IMIPRAMINE HCL 25 MG PO TABS: 25.0000 mg | ORAL_TABLET | Freq: Every day | ORAL | Status: DC

## 2013-02-27 NOTE — Telephone Encounter (Signed)
Pt called and asked for any refills to go to [facility] and not to [facility].  She stated that she was getting close to a refill date on her Imipramine 25 mg and wants [facility].  Her insurance no longer covers the [employer].  Please call if necessary.

## 2013-02-27 NOTE — Telephone Encounter (Signed)
I have removed [facility] from the patients chart.  As well, sent Imipramine to [facility].

## 2013-03-10 ENCOUNTER — Encounter: Payer: Self-pay | Admitting: Neurology

## 2013-03-10 ENCOUNTER — Ambulatory Visit (INDEPENDENT_AMBULATORY_CARE_PROVIDER_SITE_OTHER): Payer: Medicare Other | Admitting: Neurology

## 2013-03-10 ENCOUNTER — Encounter (INDEPENDENT_AMBULATORY_CARE_PROVIDER_SITE_OTHER): Payer: Self-pay

## 2013-03-10 VITALS — BP 119/80 | HR 77 | Resp 17 | Ht 64.0 in | Wt 275.0 lb

## 2013-03-10 DIAGNOSIS — M549 Dorsalgia, unspecified: Secondary | ICD-10-CM | POA: Diagnosis not present

## 2013-03-10 DIAGNOSIS — G35 Multiple sclerosis: Secondary | ICD-10-CM

## 2013-03-10 DIAGNOSIS — G4733 Obstructive sleep apnea (adult) (pediatric): Secondary | ICD-10-CM

## 2013-03-10 DIAGNOSIS — Z9989 Dependence on other enabling machines and devices: Principal | ICD-10-CM

## 2013-03-10 MED ORDER — BACLOFEN 10 MG PO TABS: 10.0000 mg | ORAL_TABLET | Freq: Three times a day (TID) | ORAL | Status: DC

## 2013-03-10 NOTE — Patient Instructions (Signed)

## 2013-03-10 NOTE — Progress Notes (Signed)
[facility]  Provider:  Melvyn Novas, M D  Referring Provider: [name], MD Primary Care Physician:  [name], MD  Chief Complaint  Patient presents with  . Follow-up    Room 10  . Multiple Sclerosis  . Insomnia    HPI:  [name] is a 58 y.o. female  Is seen here as a revisit  from Dr. Kevan Ny for the sleep study follow up.   Interval history  Mrs. Laday underwent a CPAP titration study on 12-11-12 a baseline study had documented and 8H I of 10.9 is strong evidence of additional upper airway resistance he RAD I reflected this at 21.9 per hour. I explained that the upper airway resistance he means that the patient works harder to breathe but doesn't stop wreathing therefore she does not have just apnea but she has a fatigable aspirate to maintain oxygenation and breathing. She also had very frequent periodic limb movements at 58.6 per hour. Interestingly, these were not causing arousals. The p.m. by mouth and was a frequently seen in patients that have spinal cord lesions and the patient's history of multiple sclerosis would be fitting. The patient was titrated to CPAP at 9 cm of water with a 3 cm flex function allowing her Ezio to exhale. She was fitted with an air that he can nasal pillow and small size and heated humidity was advised. She does have more restless legs and periodic limb movements on CPAP then seen in the baseline study.  I was able today to obtain a download the first one was forwarded from [facility] :  dated 02-08-13, she has 100% compliance for days used, uses her machine about 3 hours and 50 minutes at night on average and presents with a residual AHI of 0.8. She just  remains under the recommended time of use.  The patient reports that she often seems to take the mask off in the middle of her sleep not being aware of it. He also obtained an in office download dated 03-10-48 and again the patient falls just short of 4 hours of  CMS compliance for user time , she has mild air leaks, she has a very good response by reduction in  AHI to now 0.4 per hour.     Last visit :  The patient had followed [name] for over 20 years , diagnosed since 1989 and is now followed by [name], NP.  In my last visit in  2013 , I suggested Ampyra to her , which we had to D/C after her kidney function declined.  The patient is diabetic, diagnosed in 2008.  She just changed in 2014 to insulin. She remains on Betaseron. Has been gaining weight and has had a fall last December. Uses a Rollator when she is outdoors but indoors a walking stick or cane.    She has a chief complaint today of  cramping pain in the left leg, spasm, at the groin and right above the hip. No radiation , no burning and no deep ache - just spams.  Although, she has taken baclofen po for a long time , for the symptoms of  " MS hug" - it had an effect only on abdominal spasms. Patient has been taking baclofen for about 15 years now ,she recalls. On the same dose, it has not affected the hip spasms. She believes her sleep is poor due to spasms and nocturia, she may snore.  Wakes with a dry  mouth, has been told she snores. Sleeps prone.  The spasms in the left hip are preceded by sitting in certain low chairs in [employer] , which is her work place.    She needs an EMG and NCS of the left extremity and high dose of baclofen.   Review of Systems: Out of a complete 14 system review, the patient complains of only the following symptoms, and all other reviewed systems are negative.  weight gain, fatigue,  Gait disorder, falls , urinary incontinence, nocturia. Epworth Sleepiness Scale is  8 points, which is a reduction from the previous at 11. The fatigue severity score remains elevated at 55 points, the patient health questionnaire indicates mild depression at 5 points. incontinence .     History   Social History  . Marital Status: Single    Spouse Name: N/A     Number of Children: 0  . Years of Education: college   Occupational History  . disabled    Social History Main Topics  . Smoking status: Never Smoker   . Smokeless tobacco: Never Used  . Alcohol Use: No     Comment: quit in 1975  . Drug Use: No  . Sexual Activity: Not on file   Other Topics Concern  . Not on file   Social History Narrative   Patient is single and lives alone.   Patient is right-handed.   Patient has a college education.   Patient does not drink any caffeine.   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    Family History  Problem Relation Age of Onset  . Heart attack Mother   . Diabetes Mother   . Aneurysm Father   . Heart disease Father   . Cancer Maternal Grandmother     Past Medical History  Diagnosis Date  . Diabetes mellitus without complication   . Hypertension   . Morbid obesity   . High cholesterol   . MS (multiple sclerosis) relapsing remitting  . Severe obesity (BMI >= 40) 10/11/2012    Past Surgical History  Procedure Laterality Date  . Cholecystectomy  8/09  . Left oophorectomy Left 8/11    Current Outpatient Prescriptions  Medication Sig Dispense Refill  . aspirin 81 MG tablet Take 81 mg by mouth daily.      Marland Kitchen. atorvastatin (LIPITOR) 20 MG tablet Take 20 mg by mouth daily.      . baclofen (LIORESAL) 10 MG tablet Take 1 tablet (10 mg total) by mouth 3 (three) times daily.  270 tablet  2  . imipramine (TOFRANIL) 25 MG tablet Take 1 tablet (25 mg total) by mouth at bedtime.  90 tablet  2  . insulin aspart (NOVOLOG PENFILL) 100 UNIT/ML SOCT cartridge Inject into the skin daily.      . interferon beta-1b (BETASERON) 0.3 MG injection Inject 0.3 mg into the skin every other day.      Marland Kitchen. JANUVIA 50 MG tablet       . LANTUS SOLOSTAR 100 UNIT/ML SOPN       . metoprolol tartrate (LOPRESSOR) 25 MG tablet Take 25 mg by mouth daily.      . nitrofurantoin (MACRODANTIN) 100 MG capsule Take 100 mg by mouth. 1 po every other day       . pioglitazone (ACTOS) 30 MG tablet Take 30 mg by mouth daily.      Marland Kitchen. RELION PEN NEEDLES 32G X 4 MM MISC       . Tamsulosin HCl (  FLOMAX) 0.4 MG CAPS Take 0.4 mg by mouth.      . telmisartan (MICARDIS) 20 MG tablet Take by mouth daily.      . TOPROL XL 25 MG 24 hr tablet       . trimethoprim (TRIMPEX) 100 MG tablet Take 100 mg by mouth. One po every other day.      . predniSONE (DELTASONE) 10 MG tablet Take 1 tablet (10 mg total) by mouth daily.  45 tablet  3   No current facility-administered medications for this visit.    Allergies as of 03/10/2013 - Review Complete 03/10/2013  Allergen Reaction Noted  . Penicillins  10/07/2011    Vitals: BP 119/80  Pulse 77  Resp 17  Ht 5\' 4"  (1.626 m)  Wt 275 lb (124.739 kg)  BMI 47.18 kg/m2 Last Weight:  Wt Readings from Last 1 Encounters:  03/10/13 275 lb (124.739 kg)   Last Height:   Ht Readings from Last 1 Encounters:  03/10/13 5\' 4"  (1.626 m)    Physical exam:  General: The patient is awake, alert and appears not in acute distress. The patient is well groomed. Head: Normocephalic, atraumatic. Neck is supple. Mallampati 4 , neck circumference: 17 inches , no retrognathia, no retainers, no TMJ, no nasal deviation  Cardiovascular:  Regular rate and rhythm, without  murmurs or carotid bruit, and without distended neck veins. Respiratory: Lungs are clear to auscultation. Skin:  Without evidence of edema, or rash Trunk: BMI is  elevated .  Neurologic exam : The patient is awake and alert, oriented to place and time.  Memory subjective described as intact. There is a normal attention span & concentration ability.  Speech is fluent without  dysarthria, dysphonia or aphasia. Mood and affect are appropriate.  Cranial nerves: Pupils are equal and briskly reactive to light. Funduscopic exam without  evidence of pallor or edema. Extraocular movements  in vertical and horizontal planes intact and without nystagmus. Visual fields by finger  perimetry are intact. Hearing to finger rub intact.  Facial sensation intact to fine touch. Facial motor strength is symmetric and tongue and uvula move midline.  Motor exam: Normal tone and normal muscle bulk and symmetric normal strength in all extremities.  Sensory:  Fine touch, pinprick and vibration were tested in all extremities. Proprioception in upper extremities is normal.  Coordination: Rapid alternating movements/ Finger-to-nose maneuver tested and normal without evidence of ataxia, dysmetria or tremor. The patient has trouble lifting the right foot and adducting/ flexing the right hip.  She places her left leg first on the stool before she tries to climb  up at the exam table,  She is unable to turn, unable to rise without bracing herself,   Patient walks with assistive device, a walker without seat element.  Deep tendon reflexes: in the  upper and lower extremities are symmetric, brisk  and intact. Babinski maneuver response is up going right, and down going left .   Assessment:   1)MS relapsing - remitting , but without relapse over 10 years . May enter the  progressive phase of the disease.  She will remain on Betaseron, not oral medication.  2) OSA diagnosed, mild , 327.23 - titrated to CPAP with good response, but borderline compliance by user time.  The patient had no longer nocturia or only one bathroom break sicne being on CPAP. Her sleep time has improved from 6 to 7.5 hours.  3) Fatigue due to MS likely, but the elevated sleepiness score has  been reduced on CPAP. Baclofen now 10 mg tid.   The patient's spasms and neck pain have resolved and seemed to have responded to baclofen and to improve sleep. The EMG and nerve conduction study with Dr. Anne Hahn did not reveal a radiculopathy plexopathy or neuropathy.  Dr. Anne Hahn attributed the symptoms to a MS. exacerbation. The patient will be enrolled into the in cast study for patients with spasticity.

## 2013-03-28 ENCOUNTER — Encounter: Payer: Self-pay | Admitting: Neurology

## 2013-03-29 ENCOUNTER — Encounter: Payer: Self-pay | Admitting: Neurology

## 2013-03-30 ENCOUNTER — Telehealth: Payer: Self-pay | Admitting: Neurology

## 2013-03-30 NOTE — Telephone Encounter (Signed)
Pt using CPAP each day, 100% compliant, but not using the machine for at least 4 hours each night.  Told by DME, [facility] that she would have to start the process over.  Pt opts to turn in machine.  [name]; Margaret Novasarmen Dohmeier, [name]    Patient called [name] back and she does not want to retest and start the process over. She is returning the pap device.  [name] explained the options and she did not want to go through with it.           This patient will need to repeat the process with a new sleep study and f/u visit in order to qualify for another 90 day trial.  Her 90th day is on 3/12. She will not have enough time to meet 21 consecutive days out of 30 using the pap device 4 or more hours.   [name], RT will be discussing her option going forward to either return the device or sign the waiver until she and physician decides on a repeat study. If she signs the waiver, the patient will be billed for the monthly private pay rental fees which may add up to $200 -300 per month.   Thanks  [employer]       Previous Messages      ----- Message -----  From: [name]  Sent: 03/29/2013 8:38 AM  To: Margaret HawthorneAnne Sweetser   Pt's most recent download shows just 3% compliance for > 4 hours nightly use. Could you please have an RT to check on this patient. Previous reports indicate that she might be pulling the mask off during the night.  ----- Message -----  From: [name]  Sent: 03/28/2013 9:41 AM  To: [name]

## 2013-04-05 DIAGNOSIS — N183 Chronic kidney disease, stage 3 unspecified: Secondary | ICD-10-CM | POA: Diagnosis not present

## 2013-04-05 DIAGNOSIS — E782 Mixed hyperlipidemia: Secondary | ICD-10-CM | POA: Diagnosis not present

## 2013-04-05 DIAGNOSIS — I10 Essential (primary) hypertension: Secondary | ICD-10-CM | POA: Diagnosis not present

## 2013-04-05 DIAGNOSIS — E1129 Type 2 diabetes mellitus with other diabetic kidney complication: Secondary | ICD-10-CM | POA: Diagnosis not present

## 2013-05-05 DIAGNOSIS — E669 Obesity, unspecified: Secondary | ICD-10-CM | POA: Diagnosis not present

## 2013-05-05 DIAGNOSIS — Z23 Encounter for immunization: Secondary | ICD-10-CM | POA: Diagnosis not present

## 2013-05-05 DIAGNOSIS — Z6841 Body Mass Index (BMI) 40.0 and over, adult: Secondary | ICD-10-CM | POA: Diagnosis not present

## 2013-05-05 DIAGNOSIS — IMO0001 Reserved for inherently not codable concepts without codable children: Secondary | ICD-10-CM | POA: Diagnosis not present

## 2013-07-26 DIAGNOSIS — G35 Multiple sclerosis: Secondary | ICD-10-CM | POA: Diagnosis not present

## 2013-07-26 DIAGNOSIS — N312 Flaccid neuropathic bladder, not elsewhere classified: Secondary | ICD-10-CM | POA: Diagnosis not present

## 2013-07-26 DIAGNOSIS — N3020 Other chronic cystitis without hematuria: Secondary | ICD-10-CM | POA: Diagnosis not present

## 2013-08-10 DIAGNOSIS — N3020 Other chronic cystitis without hematuria: Secondary | ICD-10-CM | POA: Diagnosis not present

## 2013-08-14 DIAGNOSIS — IMO0001 Reserved for inherently not codable concepts without codable children: Secondary | ICD-10-CM | POA: Diagnosis not present

## 2013-08-14 DIAGNOSIS — R609 Edema, unspecified: Secondary | ICD-10-CM | POA: Diagnosis not present

## 2013-08-14 DIAGNOSIS — Z6841 Body Mass Index (BMI) 40.0 and over, adult: Secondary | ICD-10-CM | POA: Diagnosis not present

## 2013-08-14 DIAGNOSIS — E669 Obesity, unspecified: Secondary | ICD-10-CM | POA: Diagnosis not present

## 2013-08-16 DIAGNOSIS — E119 Type 2 diabetes mellitus without complications: Secondary | ICD-10-CM | POA: Diagnosis not present

## 2013-08-21 ENCOUNTER — Telehealth: Payer: Self-pay | Admitting: *Deleted

## 2013-08-21 NOTE — Telephone Encounter (Signed)
Called patient to r/s appointment on 09/07/13 with NP CM, no answer left voice message to return the call.

## 2013-08-25 NOTE — Telephone Encounter (Signed)
Patient returned the call and was r/s to 11/29/13 at 3:30 pm.

## 2013-09-01 ENCOUNTER — Telehealth: Payer: Self-pay | Admitting: *Deleted

## 2013-09-01 NOTE — Telephone Encounter (Signed)
Patient calling to state that the appointment for 11/29/13 works for her. If questions, please call.

## 2013-09-01 NOTE — Telephone Encounter (Signed)
Patient name on wait list for an appointment, patient is scheduled for 11/29/13, left message to return the call.

## 2013-09-04 NOTE — Telephone Encounter (Signed)
Noted, and will remove patient from wait list.

## 2013-09-07 ENCOUNTER — Ambulatory Visit: Payer: Medicare Other | Admitting: Nurse Practitioner

## 2013-11-26 ENCOUNTER — Other Ambulatory Visit: Payer: Self-pay | Admitting: Neurology

## 2013-11-29 ENCOUNTER — Ambulatory Visit: Payer: Medicare Other | Admitting: Nurse Practitioner

## 2013-12-11 ENCOUNTER — Telehealth: Payer: Self-pay | Admitting: Neurology

## 2013-12-11 MED ORDER — PREDNISONE 10 MG PO TABS: ORAL_TABLET | ORAL | Status: DC

## 2013-12-11 NOTE — Telephone Encounter (Signed)
Patient is calling to get a Rx for Prednisone called in to [facility] in [place] because patient is having MS exacerbation.. Thank you.

## 2013-12-11 NOTE — Telephone Encounter (Signed)
I called pt and she stated that she cannot come in till first part of December, due to not driving.   I told her that she needs to come in and be examined before meds can be prescribed.  I told her to contact her sister and see when she could bring her.  She would call me back.  Dr. Vickey Huger or NP to see her. Some available appts tomorrow.

## 2013-12-11 NOTE — Telephone Encounter (Signed)
Patient is having MS Exacerbation and would like a Refill on Prednisone.  Please advise.  Thank you.

## 2014-01-23 DIAGNOSIS — E780 Pure hypercholesterolemia: Secondary | ICD-10-CM | POA: Diagnosis not present

## 2014-01-23 DIAGNOSIS — I10 Essential (primary) hypertension: Secondary | ICD-10-CM | POA: Diagnosis not present

## 2014-01-23 DIAGNOSIS — E119 Type 2 diabetes mellitus without complications: Secondary | ICD-10-CM | POA: Diagnosis not present

## 2014-01-24 ENCOUNTER — Ambulatory Visit (INDEPENDENT_AMBULATORY_CARE_PROVIDER_SITE_OTHER): Payer: Medicare Other | Admitting: Nurse Practitioner

## 2014-01-24 ENCOUNTER — Encounter: Payer: Self-pay | Admitting: Nurse Practitioner

## 2014-01-24 VITALS — BP 102/66 | HR 109 | Temp 98.1°F | Resp 20 | Ht 64.0 in | Wt 266.6 lb

## 2014-01-24 DIAGNOSIS — I10 Essential (primary) hypertension: Secondary | ICD-10-CM | POA: Diagnosis not present

## 2014-01-24 DIAGNOSIS — E119 Type 2 diabetes mellitus without complications: Secondary | ICD-10-CM | POA: Diagnosis not present

## 2014-01-24 DIAGNOSIS — G35 Multiple sclerosis: Secondary | ICD-10-CM | POA: Diagnosis not present

## 2014-01-24 DIAGNOSIS — R269 Unspecified abnormalities of gait and mobility: Secondary | ICD-10-CM

## 2014-01-24 DIAGNOSIS — E780 Pure hypercholesterolemia: Secondary | ICD-10-CM | POA: Diagnosis not present

## 2014-01-24 NOTE — Patient Instructions (Signed)
Continue Betaseron for now FAX recent labs to [phone] Given information on oral medications for multiple sclerosis to include side effects/advantages disadvantages We will need to get MRI of the brain but I need to  know what  renal function is prior to setting this up with contrast Please read over the information and give me a call back Follow-up in 6-8 months

## 2014-01-24 NOTE — Progress Notes (Signed)
[facility]  PATIENT: [name] DOB: 1955-08-27   REASON FOR VISIT: Follow-up for multiple sclerosis.   HISTORY OF PRESENT ILLNESS: [name], 58 year old female returns for follow-up. She was last seen in this office by Dr. Brett Fairy 03/10/2013 and previously by myself 04/06/2012. She has a history of multiple sclerosis relapsing remitting, versus relapsing progressive. She has been followed at [facility] since 1994, when she presented with weakness, tingling and vision problems. MRI of the brain at that time showed lesions compatible with MS  predominantly in the brain stem and posterior fossa. MRI of the cervical spine showed lesions consistent with MS. She was initiated on Betaseron in November of 1994 and was off of the medication briefly in 2001 due to elevation of LFTs. She has had relapses during hospitalization and pseudo exacerbations with mild persistent gait deficit, slight spastic paraparesis, and a wide-based gait. She denies any difficulty with her Betaseron, no difficulty with site injections. She ambulates with a cane . She has  fatigue, urinary frequency, diabetes and high cholesterol.  MS symptoms are stable.  She is inquiring about oral preparations for MS. She is tired of taking shots. No new neurological complaints.     03/10/13:[name] underwent a CPAP titration study on 12-11-12 a baseline study had documented and 8H I of 10.9 is strong evidence of additional upper airway resistance he RAD I reflected this at 21.9 per hour. I explained that the upper airway resistance he means that the patient works harder to breathe but doesn't stop wreathing therefore she does not have just apnea but she has a fatigable aspirate to maintain oxygenation and breathing. She also had very frequent periodic limb movements at 58.6 per hour. Interestingly, these were not causing arousals. The p.m. by mouth and was a frequently seen in patients that have spinal cord lesions and the  patient's history of multiple sclerosis would be fitting. The patient was titrated to CPAP at 9 cm of water with a 3 cm flex function allowing her Ezio to exhale. She was fitted with an air that he can nasal pillow and small size and heated humidity was advised. She does have more restless legs and periodic limb movements on CPAP then seen in the baseline study.  I was able today to obtain a download the first one was forwarded from [facility] : dated 02-08-13, she has 100% compliance for days used, uses her machine about 3 hours and 50 minutes at night on average and presents with a residual AHI of 0.8. She just remains under the recommended time of use.  The patient reports that she often seems to take the mask off in the middle of her sleep not being aware of it. He also obtained an in office download dated 03-10-48 and again the patient falls just short of 4 hours of CMS compliance for user time , she has mild air leaks, she has a very good response by reduction in AHI to now 0.4 per hour.   REVIEW OF SYSTEMS: Full 14 system review of systems performed and notable only for those listed, all others are neg:  Constitutional:  fatigue Cardiovascular: N/A  Ear/Nose/Throat: N/A  Skin: N/A  Eyes: N/A  Respiratory: N/A  Gastroitestinal: N/A  Hematology/Lymphatic: N/A  Endocrine: N/A Musculoskeletal Walking difficulty Allergy/Immunology: N/A  Neurological: N/A Psychiatric: N/A Sleep : NA   ALLERGIES: Allergies  Allergen Reactions  . Penicillins     HOME MEDICATIONS: Outpatient Prescriptions Prior to Visit  Medication  Sig Dispense Refill  . aspirin 81 MG tablet Take 81 mg by mouth daily.    . baclofen (LIORESAL) 10 MG tablet Take 1 tablet (10 mg total) by mouth 3 (three) times daily. 270 tablet 2  . imipramine (TOFRANIL) 25 MG tablet TAKE 1 TABLET AT BEDTIME 90 tablet 0  . interferon beta-1b (BETASERON) 0.3 MG injection Inject 0.3 mg into the skin every other day.    Marland Kitchen LANTUS  SOLOSTAR 100 UNIT/ML SOPN Inject 44 Units into the skin at bedtime.     . metoprolol tartrate (LOPRESSOR) 25 MG tablet Take 25 mg by mouth daily.    . nitrofurantoin (MACRODANTIN) 100 MG capsule Take 100 mg by mouth. 1 po every other day    . Tamsulosin HCl (FLOMAX) 0.4 MG CAPS Take 0.4 mg by mouth daily after breakfast.     . telmisartan (MICARDIS) 20 MG tablet Take 20 mg by mouth daily.     Marland Kitchen trimethoprim (TRIMPEX) 100 MG tablet Take 100 mg by mouth every other day.     Marland Kitchen RELION PEN NEEDLES 32G X 4 MM MISC     . atorvastatin (LIPITOR) 20 MG tablet Take 20 mg by mouth daily.    . insulin aspart (NOVOLOG PENFILL) 100 UNIT/ML SOCT cartridge Inject 44 Units into the skin at bedtime.     Marland Kitchen JANUVIA 50 MG tablet     . pioglitazone (ACTOS) 30 MG tablet Take 30 mg by mouth daily.    . predniSONE (DELTASONE) 10 MG tablet Take 3 in AM po for 3 days than 2 in am for 3 days, than one in am for the rest of the pack. PO 45 tablet 1  . TOPROL XL 25 MG 24 hr tablet      No facility-administered medications prior to visit.    PAST MEDICAL HISTORY: Past Medical History  Diagnosis Date  . Diabetes mellitus without complication   . Hypertension   . Morbid obesity   . High cholesterol   . MS (multiple sclerosis) relapsing remitting  . Severe obesity (BMI >= 40) 10/11/2012    PAST SURGICAL HISTORY: Past Surgical History  Procedure Laterality Date  . Cholecystectomy  8/09  . Left oophorectomy Left 8/11    FAMILY HISTORY: Family History  Problem Relation Age of Onset  . Heart attack Mother   . Diabetes Mother   . Aneurysm Father   . Heart disease Father   . Cancer Maternal Grandmother     SOCIAL HISTORY: History   Social History  . Marital Status: Single    Spouse Name: N/A    Number of Children: 0  . Years of Education: college   Occupational History  . disabled    Social History Main Topics  . Smoking status: Never Smoker   . Smokeless tobacco: Never Used  . Alcohol Use: No      Comment: quit in 1975  . Drug Use: No  . Sexual Activity: Not on file   Other Topics Concern  . Not on file   Social History Narrative   Patient is single and lives alone.   Patient is right-handed.   Patient has a college education.   Patient does not drink any caffeine.   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     PHYSICAL EXAM  Filed Vitals:   01/24/14 1241  BP: 102/66  Pulse: 109  Temp: 98.1 F (36.7 C)  TempSrc: Oral  Resp: 20  Height: '5\' 4"'  (1.626 m)  Weight: 266 lb 9.6 oz (120.929 kg)   Body mass index is 45.74 kg/(m^2). General: The patient is awake, alert and appears not in acute distress. The patient is well groomed. Head: Normocephalic, atraumatic.  Neck is supple.  Cardiovascular: Regular rate and rhythm,  Respiratory: Lungs are clear to auscultation. Skin: Without evidence of edema, or rash Trunk: BMI is elevated .  Neurologic exam : The patient is awake and alert, oriented to place and time.  Memory subjective described as intact. There is a normal attention span & concentration ability. Speech is fluent without dysarthria, dysphonia or aphasia.  Cranial nerves: Visual acuity 20/70 on the left, 20/50 on the right. Pupils are equal and briskly reactive to light. Funduscopic exam without evidence of pallor or edema. Extraocular movements in vertical and horizontal planes intact and without nystagmus. Visual fields by finger perimetry are intact. Hearing to finger rub intact. Facial sensation intact to fine touch. Facial motor strength is symmetric and tongue and uvula move midline. Motor exam: Normal tone and normal muscle bulk and symmetric normal strength in all extremities. Sensory: Fine touch, pinprick and vibration were tested in all extremities. Proprioception in upper extremities is normal. Coordination: Rapid alternating movements/ Finger-to-nose maneuver tested and normal without evidence of ataxia, dysmetria or tremor. The  patient has trouble lifting the right foot and adducting/ flexing the right hip.  She is unable to turn, unable to rise without bracing herself,  GaitPatient walks with assistive device, a walker without seat element.  slow steady gait  Deep tendon reflexes: in the upper and lower extremities are symmetric, brisk and intact. Babinski maneuver response is up going right, and down going left .   DIAGNOSTIC DATA (LABS, IMAGING, TESTING) - I reviewed patient records, labs, notes, testing and imaging myself where available.  Lab Results  Component Value Date   WBC 4.9 08/28/2009   HGB 11.9* 08/28/2009   HCT 35.6* 08/28/2009   MCV 87.1 08/28/2009   PLT 286 08/28/2009      Component Value Date/Time   NA 139 08/28/2009 1214   K 4.4 08/28/2009 1214   CL 104 08/28/2009 1214   CO2 28 08/28/2009 1214   GLUCOSE 123* 08/28/2009 1214   BUN 18 08/28/2009 1214   CREATININE 0.80 08/28/2009 1214   CALCIUM 9.3 08/28/2009 1214   PROT 5.5* 09/09/2007 0520   ALBUMIN 2.6* 09/09/2007 0520   AST 34 09/09/2007 0520   ALT 123* 09/09/2007 0520   ALKPHOS 243* 09/09/2007 0520   BILITOT 0.9 09/09/2007 0520   GFRNONAA >60 08/28/2009 1214   GFRAA  08/28/2009 1214    >60        The eGFR has been calculated using the MDRD equation. This calculation has not been validated in all clinical situations. eGFR's persistently <60 mL/min signify possible Chronic Kidney Disease.    ASSESSMENT AND PLAN  58 y.o. year old female  has a past medical history of Diabetes mellitus without complication; Hypertension; Morbid obesity; High cholesterol; MS (multiple sclerosis) (relapsing remitting); and Severe obesity (BMI >= 40) (10/11/2012). here  to follow up for her multiple sclerosis. She is no longer using her CPAP   Continue Betaseron for now FAX recent labs to [phone] Given information on oral medications for multiple sclerosis to include side effects/advantages disadvantages We will need to get MRI of  the brain but I need to  know what  renal function is prior to setting this up with contrast Last MRI 2006 Please read over the information and  give me a call back Follow-up in 6-8 months Dennie Bible, [facility], [facility], APRN  [facility] [address] [place], [place] [address] [phone]

## 2014-01-31 DIAGNOSIS — N183 Chronic kidney disease, stage 3 (moderate): Secondary | ICD-10-CM | POA: Diagnosis not present

## 2014-02-02 DIAGNOSIS — N183 Chronic kidney disease, stage 3 (moderate): Secondary | ICD-10-CM | POA: Diagnosis not present

## 2014-02-02 DIAGNOSIS — R8290 Unspecified abnormal findings in urine: Secondary | ICD-10-CM | POA: Diagnosis not present

## 2014-02-08 ENCOUNTER — Other Ambulatory Visit: Payer: Self-pay | Admitting: Neurology

## 2014-02-08 NOTE — Telephone Encounter (Signed)
Prescribed at [facility] on 09/16 

## 2014-03-01 ENCOUNTER — Other Ambulatory Visit: Payer: Self-pay | Admitting: Neurology

## 2014-03-20 DIAGNOSIS — R339 Retention of urine, unspecified: Secondary | ICD-10-CM | POA: Diagnosis not present

## 2014-03-20 DIAGNOSIS — N312 Flaccid neuropathic bladder, not elsewhere classified: Secondary | ICD-10-CM | POA: Diagnosis not present

## 2014-03-20 DIAGNOSIS — N3020 Other chronic cystitis without hematuria: Secondary | ICD-10-CM | POA: Diagnosis not present

## 2014-03-20 DIAGNOSIS — N390 Urinary tract infection, site not specified: Secondary | ICD-10-CM | POA: Diagnosis not present

## 2014-04-30 DIAGNOSIS — I129 Hypertensive chronic kidney disease with stage 1 through stage 4 chronic kidney disease, or unspecified chronic kidney disease: Secondary | ICD-10-CM | POA: Diagnosis not present

## 2014-04-30 DIAGNOSIS — N189 Chronic kidney disease, unspecified: Secondary | ICD-10-CM | POA: Diagnosis not present

## 2014-04-30 DIAGNOSIS — N2581 Secondary hyperparathyroidism of renal origin: Secondary | ICD-10-CM | POA: Diagnosis not present

## 2014-04-30 DIAGNOSIS — N183 Chronic kidney disease, stage 3 (moderate): Secondary | ICD-10-CM | POA: Diagnosis not present

## 2014-04-30 DIAGNOSIS — D631 Anemia in chronic kidney disease: Secondary | ICD-10-CM | POA: Diagnosis not present

## 2014-04-30 DIAGNOSIS — E1129 Type 2 diabetes mellitus with other diabetic kidney complication: Secondary | ICD-10-CM | POA: Diagnosis not present

## 2014-05-01 DIAGNOSIS — E119 Type 2 diabetes mellitus without complications: Secondary | ICD-10-CM | POA: Diagnosis not present

## 2014-05-01 DIAGNOSIS — Z23 Encounter for immunization: Secondary | ICD-10-CM | POA: Diagnosis not present

## 2014-05-01 DIAGNOSIS — Z1389 Encounter for screening for other disorder: Secondary | ICD-10-CM | POA: Diagnosis not present

## 2014-05-02 ENCOUNTER — Other Ambulatory Visit (HOSPITAL_COMMUNITY): Payer: Self-pay | Admitting: Nephrology

## 2014-05-02 DIAGNOSIS — N183 Chronic kidney disease, stage 3 unspecified: Secondary | ICD-10-CM

## 2014-05-07 ENCOUNTER — Ambulatory Visit (HOSPITAL_COMMUNITY): Payer: Medicare Other

## 2014-05-08 ENCOUNTER — Ambulatory Visit (HOSPITAL_COMMUNITY)
Admission: RE | Admit: 2014-05-08 | Discharge: 2014-05-08 | Disposition: A | Discharge status: Home / Self Care | Payer: Medicare Other | Source: Ambulatory Visit | Attending: Nephrology | Admitting: Nephrology

## 2014-05-08 DIAGNOSIS — N183 Chronic kidney disease, stage 3 unspecified: Secondary | ICD-10-CM

## 2014-05-08 DIAGNOSIS — N189 Chronic kidney disease, unspecified: Secondary | ICD-10-CM | POA: Insufficient documentation

## 2014-05-28 ENCOUNTER — Encounter: Payer: Medicare Other | Attending: "Endocrinology | Admitting: Nutrition

## 2014-05-28 ENCOUNTER — Encounter: Payer: Self-pay | Admitting: Nutrition

## 2014-05-28 VITALS — Ht 65.0 in | Wt 245.0 lb

## 2014-05-28 DIAGNOSIS — Z713 Dietary counseling and surveillance: Secondary | ICD-10-CM | POA: Insufficient documentation

## 2014-05-28 DIAGNOSIS — E1122 Type 2 diabetes mellitus with diabetic chronic kidney disease: Secondary | ICD-10-CM | POA: Diagnosis not present

## 2014-05-28 DIAGNOSIS — IMO0002 Reserved for concepts with insufficient information to code with codable children: Secondary | ICD-10-CM

## 2014-05-28 DIAGNOSIS — E1165 Type 2 diabetes mellitus with hyperglycemia: Secondary | ICD-10-CM

## 2014-05-28 DIAGNOSIS — E785 Hyperlipidemia, unspecified: Secondary | ICD-10-CM | POA: Diagnosis not present

## 2014-05-28 DIAGNOSIS — I10 Essential (primary) hypertension: Secondary | ICD-10-CM | POA: Diagnosis not present

## 2014-05-28 DIAGNOSIS — Z6841 Body Mass Index (BMI) 40.0 and over, adult: Secondary | ICD-10-CM | POA: Diagnosis not present

## 2014-05-28 DIAGNOSIS — E118 Type 2 diabetes mellitus with unspecified complications: Secondary | ICD-10-CM | POA: Insufficient documentation

## 2014-05-28 DIAGNOSIS — Z794 Long term (current) use of insulin: Secondary | ICD-10-CM | POA: Diagnosis not present

## 2014-05-28 NOTE — Progress Notes (Signed)
  Medical Nutrition Therapy:  Appt start time: 1000 end time:  1030.  Assessment:  Primary concerns today: Diabetes.  Walk in visit with Dr. Fransico Him.  LIves with by herself. She does her own shopping and cooking. She usually eats out a lot. She drives.  Eats a lot of leftovers from family. FBS 100-105    HS 150's. Lantus 48 units of Lantus, Truclicity once a week and Metformin 500 mg BID. Was taking all 4 pills (2 g total) previously all at the same time in the evening meal. Physical actvity: tries to do some walking. PMH: Multiple Sclerosis, DM and Hypertension and Hyperlipidemia. Does haven' any feeling in her extremities.  Has lost 30 lbs since being on Truclicity.  1.5 mg. Use to see Dr. Sharl Ma in [place] but lives here locally now.   Preferred Learning Style:  Auditory  Visual  Hands on  Learning Readiness:    Ready  Change in progress   MEDICATIONS:See list   DIETARY INTAKE:  24-hr recall:  B ( AM): Banana nut muffin 2 mini's water Snk ( AM): none  L ( PM): Baked chicken, potatoes, and corn cob, water Snk ( PM): cereal bar peanutbutter/chocolate bar D ( PM): skipped Snk ( PM): none Beverages: water and working on cutting out caffeine.  Usual physical activity: limited due to MA  Estimated energy needs: 1200 calories 135 g carbohydrates 90 g protein 33 g fat  Progress Towards Goal(s):  In progress.   Nutritional Diagnosis:  NB-1.1 Food and nutrition-related knowledge deficit As related to Diabetes.  As evidenced by A1C 7.3%..    Intervention:  Nutrition counseling and diabetes education on disease, meal planning, The Plate Method, Carb Counting, target ranges for blood sugars, complications of DM and importance of physical activity for further weigh loss and improved blood sugars control.  Goal: 1. Follow Meal Plan of eating 2-3 carb choices per meal. 2. Cut out snacks. 3. Avoid skipping meals. 4. Talk to the Y and start going to water aerobics classes for  exercise. 5. Increase fresh fruits and vegetables. 6. Lose  1 lbs per week. 7. Keep food journal daily.  Teaching Method Utilized:  Visual Auditory Hands on  Handouts given during visit include:  The Plate Method  The Meal Plan Card  Barriers to learning/adherence to lifestyle change: none  Demonstrated degree of understanding via:  Teach Back   Monitoring/Evaluation:  Dietary intake, exercise, meal planning, and body weight in 1 month(s).

## 2014-05-28 NOTE — Patient Instructions (Signed)
Goal: 1. Follow Meal Plan of eating 2-3 carb choices per meal. 2. Cut out snacks. 3. Avoid skipping meals. 4. Talk to the [facility] and start going to water aerobics classes for exercise. 5. Increase fresh fruits and vegetables. 6. Lose  1 lbs per week. 7. Keep food journal daily.

## 2014-06-04 DIAGNOSIS — E6609 Other obesity due to excess calories: Secondary | ICD-10-CM | POA: Diagnosis not present

## 2014-06-04 DIAGNOSIS — I10 Essential (primary) hypertension: Secondary | ICD-10-CM | POA: Diagnosis not present

## 2014-06-04 DIAGNOSIS — E1165 Type 2 diabetes mellitus with hyperglycemia: Secondary | ICD-10-CM | POA: Diagnosis not present

## 2014-06-23 ENCOUNTER — Other Ambulatory Visit: Payer: Self-pay | Admitting: Neurology

## 2014-06-25 NOTE — Telephone Encounter (Signed)
First prescribed at [facility] on 09/16

## 2014-07-09 ENCOUNTER — Encounter: Payer: Medicare Other | Attending: "Endocrinology | Admitting: Nutrition

## 2014-07-09 DIAGNOSIS — Z713 Dietary counseling and surveillance: Secondary | ICD-10-CM | POA: Insufficient documentation

## 2014-07-09 DIAGNOSIS — Z6841 Body Mass Index (BMI) 40.0 and over, adult: Secondary | ICD-10-CM | POA: Insufficient documentation

## 2014-07-09 DIAGNOSIS — Z794 Long term (current) use of insulin: Secondary | ICD-10-CM | POA: Insufficient documentation

## 2014-07-09 DIAGNOSIS — E118 Type 2 diabetes mellitus with unspecified complications: Secondary | ICD-10-CM | POA: Insufficient documentation

## 2014-07-18 ENCOUNTER — Other Ambulatory Visit: Payer: Self-pay | Admitting: Neurology

## 2014-07-23 DIAGNOSIS — N2581 Secondary hyperparathyroidism of renal origin: Secondary | ICD-10-CM | POA: Diagnosis not present

## 2014-07-23 DIAGNOSIS — N183 Chronic kidney disease, stage 3 (moderate): Secondary | ICD-10-CM | POA: Diagnosis not present

## 2014-07-23 DIAGNOSIS — I129 Hypertensive chronic kidney disease with stage 1 through stage 4 chronic kidney disease, or unspecified chronic kidney disease: Secondary | ICD-10-CM | POA: Diagnosis not present

## 2014-07-23 DIAGNOSIS — E1129 Type 2 diabetes mellitus with other diabetic kidney complication: Secondary | ICD-10-CM | POA: Diagnosis not present

## 2014-08-05 ENCOUNTER — Other Ambulatory Visit: Payer: Self-pay | Admitting: Neurology

## 2014-08-13 DIAGNOSIS — I10 Essential (primary) hypertension: Secondary | ICD-10-CM | POA: Diagnosis not present

## 2014-08-13 DIAGNOSIS — E1165 Type 2 diabetes mellitus with hyperglycemia: Secondary | ICD-10-CM | POA: Diagnosis not present

## 2014-08-13 DIAGNOSIS — E6609 Other obesity due to excess calories: Secondary | ICD-10-CM | POA: Diagnosis not present

## 2014-08-13 DIAGNOSIS — E039 Hypothyroidism, unspecified: Secondary | ICD-10-CM | POA: Diagnosis not present

## 2014-08-20 ENCOUNTER — Ambulatory Visit: Payer: Medicare Other | Admitting: Nurse Practitioner

## 2014-08-22 ENCOUNTER — Encounter: Payer: Self-pay | Admitting: Nurse Practitioner

## 2014-08-22 ENCOUNTER — Ambulatory Visit (INDEPENDENT_AMBULATORY_CARE_PROVIDER_SITE_OTHER): Payer: Medicare Other | Admitting: Nurse Practitioner

## 2014-08-22 VITALS — BP 92/63 | HR 110 | Ht 65.0 in | Wt 238.5 lb

## 2014-08-22 DIAGNOSIS — R269 Unspecified abnormalities of gait and mobility: Secondary | ICD-10-CM

## 2014-08-22 DIAGNOSIS — G35 Multiple sclerosis: Secondary | ICD-10-CM | POA: Diagnosis not present

## 2014-08-22 NOTE — Progress Notes (Signed)
[facility]  PATIENT: MICHELENE BARRIENTOS DOB: August 15, 1955   REASON FOR VISIT: Follow-up for multiple sclerosis relapsing remitting,  gait abnormality HISTORY FROM: Patient    HISTORY OF PRESENT ILLNESS:[name], 59 year old female returns for follow-up. She was last seen in this office 01/24/14. She has a history of multiple sclerosis relapsing remitting, versus relapsing progressive. She has been followed at [facility] since 1994, when she presented with weakness, tingling and vision problems. MRI of the brain at that time showed lesions compatible with MS predominantly in the brain stem and posterior fossa. MRI of the cervical spine showed lesions consistent with MS. She was initiated on Betaseron in November of 1994 and was off of the medication briefly in 2001 due to elevation of LFTs. She has had relapses during hospitalization and pseudo exacerbations with mild persistent gait deficit, slight spastic paraparesis, and a wide-based gait. She denies any difficulty with her Betaseron, no difficulty with site injections. She ambulates with a rolling walker . She has fatigue, urinary frequency, diabetes and high cholesterol. MS symptoms are stable. She has been on Betaseron for quite some time however in talking to her about oral preparations , she does not want to switch to oral preparations at this time . She was recently diagnosed with hypothyroidism. She has chronic kidney disease stage III. She is no longer using her CPAP. No new neurological complaints.    03/10/13:Mrs. Melvyn Neth underwent a CPAP titration study on 12-11-12 a baseline study had documented and 8H I of 10.9 is strong evidence of additional upper airway resistance he RAD I reflected this at 21.9 per hour. I explained that the upper airway resistance he means that the patient works harder to breathe but doesn't stop wreathing therefore she does not have just apnea but she has a fatigable aspirate to maintain oxygenation  and breathing. She also had very frequent periodic limb movements at 58.6 per hour. Interestingly, these were not causing arousals. The p.m. by mouth and was a frequently seen in patients that have spinal cord lesions and the patient's history of multiple sclerosis would be fitting. The patient was titrated to CPAP at 9 cm of water with a 3 cm flex function allowing her Ezio to exhale. She was fitted with an air that he can nasal pillow and small size and heated humidity was advised. She does have more restless legs and periodic limb movements on CPAP then seen in the baseline study.  I was able today to obtain a download the first one was [facility] from [facility] : dated 02-08-13, she has 100% compliance for days used, uses her machine about 3 hours and 50 minutes at night on average and presents with a residual AHI of 0.8. She just remains under the recommended time of use.  The patient reports that she often seems to take the mask off in the middle of her sleep not being aware of it. He also obtained an in office download dated 03-10-48 and again the patient falls just short of 4 hours of CMS compliance for user time , she has mild air leaks, she has a very good response by reduction in AHI to now 0.4 per hour.     REVIEW OF SYSTEMS: Full 14 system review of systems performed and notable only for those listed, all others are neg:  Constitutional: neg  Cardiovascular: neg Ear/Nose/Throat: neg  Skin: neg Eyes: neg Respiratory: neg Gastroitestinal: Diarrhea, frequency of urination Hematology/Lymphatic: neg  Endocrine: neg Musculoskeletal:neg Allergy/Immunology: neg  Neurological: neg Psychiatric: neg Sleep : neg   ALLERGIES: Allergies  Allergen Reactions  . Penicillins     HOME MEDICATIONS: Outpatient Prescriptions Prior to Visit  Medication Sig Dispense Refill  . aspirin 81 MG tablet Take 81 mg by mouth daily.    Marland Kitchen atorvastatin (LIPITOR) 40 MG tablet Take 40 mg by mouth  daily.     . baclofen (LIORESAL) 10 MG tablet TAKE 1 TABLET THREE TIMES A DAY 270 tablet 0  . imipramine (TOFRANIL) 25 MG tablet TAKE 1 TABLET AT BEDTIME 90 tablet 1  . interferon beta-1b (BETASERON) 0.3 MG injection Inject 0.3 mg into the skin every other day.    Marland Kitchen LANTUS SOLOSTAR 100 UNIT/ML SOPN Inject 40 Units into the skin at bedtime.     . metFORMIN (GLUCOPHAGE-XR) 500 MG 24 hr tablet Take 500 mg by mouth 2 (two) times daily.     . metoprolol tartrate (LOPRESSOR) 25 MG tablet Take 25 mg by mouth daily.    . nitrofurantoin (MACRODANTIN) 100 MG capsule Take 100 mg by mouth. 1 po every other day    . RELION PEN NEEDLES 32G X 4 MM MISC     . Tamsulosin HCl (FLOMAX) 0.4 MG CAPS Take 0.4 mg by mouth daily after breakfast.     . telmisartan (MICARDIS) 20 MG tablet Take 20 mg by mouth daily.     Marland Kitchen tolterodine (DETROL LA) 4 MG 24 hr capsule TAKE 1 CAPSULE DAILY 90 capsule 0  . trimethoprim (TRIMPEX) 100 MG tablet Take 100 mg by mouth every other day.     . TRULICITY 1.5 MG/0.5ML SOPN Inject 1.5 mg into the skin once a week.     . telmisartan (MICARDIS) 20 MG tablet Take 20 mg by mouth daily.     No facility-administered medications prior to visit.    PAST MEDICAL HISTORY: Past Medical History  Diagnosis Date  . Diabetes mellitus without complication   . Hypertension   . Morbid obesity   . High cholesterol   . MS (multiple sclerosis) relapsing remitting  . Severe obesity (BMI >= 40) 10/11/2012    PAST SURGICAL HISTORY: Past Surgical History  Procedure Laterality Date  . Cholecystectomy  8/09  . Left oophorectomy Left 8/11    FAMILY HISTORY: Family History  Problem Relation Age of Onset  . Heart attack Mother   . Diabetes Mother   . Aneurysm Father   . Heart disease Father   . Cancer Maternal Grandmother     SOCIAL HISTORY: History   Social History  . Marital Status: Single    Spouse Name: N/A  . Number of Children: 0  . Years of Education: college   Occupational  History  . disabled    Social History Main Topics  . Smoking status: Never Smoker   . Smokeless tobacco: Never Used  . Alcohol Use: No     Comment: quit in 1975  . Drug Use: No  . Sexual Activity: Not on file   Other Topics Concern  . Not on file   Social History Narrative   Patient is single and lives alone.   Patient is right-handed.   Patient has a college education.   Patient does not drink any caffeine.   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     PHYSICAL EXAM  Filed Vitals:   08/22/14 1458  BP: 92/63  Pulse: 110  Height:  (1.651 m)  Weight: 238 lb 8 oz (108.183 kg)  Body mass index is 39.69 kg/(m^2). General: The patient is awake, alert and appears not in acute distress. The patient is well groomed. Head: Normocephalic, atraumatic.  Neck is supple.  Cardiovascular: Regular rate and rhythm,  Respiratory: Lungs are clear to auscultation. Skin: Without evidence of edema, or rash Trunk: BMI is elevated .  Neurologic exam : MemoryThe patient is awake and alert, oriented to place and time. There is a normal attention span & concentration ability.Speech is fluent without dysarthria, dysphonia or aphasia.  Cranial nerves: Visual acuity 20/40 bilateral. Pupils are equal and briskly reactive to light. Funduscopic exam without evidence of pallor or edema. Extraocular movements in vertical and horizontal planes intact and without nystagmus. Visual fields by finger perimetry are intact.Hearing to finger rub intact. Facial sensation intact to fine touch. Facial motor strength is symmetric and tongue and uvula move midline. Motor exam: Normal tone and normal muscle bulk and symmetric normal strength in all extremities. Sensory: Fine touch, pinprick and vibration were tested in all extremities. Proprioception in upper extremities is normal. Coordination: Rapid alternating movements/ Finger-to-nose maneuver tested and normal without evidence of  ataxia, dysmetria or tremor. The patient has trouble lifting the right foot and adducting/ flexing the right hip.  GaitPatient walks with assistive device, a walker without seat element. slow steady gait no difficulty with turns Deep tendon reflexes: in the upper and lower extremities are symmetric,. Babinski maneuver response is up going right, and down going left .    DIAGNOSTIC DATA (LABS, IMAGING, TESTING) -  ASSESSMENT AND PLAN  59 y.o. year old female  has a past medical history of Diabetes mellitus without complication; Hypertension; Morbid obesity; High cholesterol; MS (multiple sclerosis) (relapsing remitting); and Severe obesity (BMI >= 40) (10/11/2012). here to follow-up for multiple sclerosis. She is no longer using CPAP. We have discussed. PO medications  for her multiple sclerosis but she wishes to stay on Betaseron at this time. Last MRI was in 2006. She has chronic kidney disease stage III. We will obtain without contrast.   Reviewed recent labs from Dr. Allena Katz collected 04/30/14.CBC and BMP  Obtain MRI of the brain without contrast.Last 2006 Continue Betaseron every other day F/U in 6 months next with Dr. Vickey Huger Nilda Riggs, [facility], [facility], APRN  [facility] [address] [place], [place] [address] [phone]

## 2014-08-22 NOTE — Patient Instructions (Signed)
Reviewed recent labs Obtain MRI of the brain Continue Betaseron every other day F/U in 6 months next with Dr. Vickey Huger

## 2014-08-23 NOTE — Progress Notes (Signed)
I agree with the assessment and plan as directed by NP .The patient is known to me .   Kaliyan Osbourn, MD  

## 2014-09-01 ENCOUNTER — Other Ambulatory Visit: Payer: Self-pay | Admitting: Neurology

## 2014-09-03 ENCOUNTER — Ambulatory Visit
Admission: RE | Admit: 2014-09-03 | Discharge: 2014-09-03 | Disposition: A | Discharge status: Home / Self Care | Payer: Medicare Other | Source: Ambulatory Visit | Attending: Nurse Practitioner | Admitting: Nurse Practitioner

## 2014-09-03 ENCOUNTER — Telehealth: Payer: Self-pay | Admitting: *Deleted

## 2014-09-03 DIAGNOSIS — G35 Multiple sclerosis: Secondary | ICD-10-CM

## 2014-09-03 NOTE — Telephone Encounter (Signed)
-----   Message from Nilda Riggs, NP sent at 09/03/2014  4:05 PM EDT ----- MRI of the brain is stable please call the patient

## 2014-09-03 NOTE — Telephone Encounter (Signed)
I spoke to pt and let her know that MRI brain stable.  Pt verbalized understanding.

## 2014-09-04 DIAGNOSIS — N3020 Other chronic cystitis without hematuria: Secondary | ICD-10-CM | POA: Diagnosis not present

## 2014-09-04 DIAGNOSIS — N3000 Acute cystitis without hematuria: Secondary | ICD-10-CM | POA: Diagnosis not present

## 2014-09-04 DIAGNOSIS — G35 Multiple sclerosis: Secondary | ICD-10-CM | POA: Diagnosis not present

## 2014-09-04 DIAGNOSIS — N312 Flaccid neuropathic bladder, not elsewhere classified: Secondary | ICD-10-CM | POA: Diagnosis not present

## 2014-09-13 ENCOUNTER — Other Ambulatory Visit: Payer: Self-pay

## 2014-09-13 ENCOUNTER — Telehealth: Payer: Self-pay | Admitting: Neurology

## 2014-09-13 DIAGNOSIS — G35 Multiple sclerosis: Secondary | ICD-10-CM

## 2014-09-13 NOTE — Telephone Encounter (Signed)
I called pt and she will call  [facility] ([facility]  order is for solumedrol 500mg  IV daily for 3 days to start Friday 09-14-14  [phone].  I gave her the information, tele # and our contact Joy here at the office.for tomorrow if issues come up.

## 2014-09-13 NOTE — Telephone Encounter (Signed)
Patient returned call. Has concerns about being able to get to the office for solumedrol intrafusion.

## 2014-09-13 NOTE — Telephone Encounter (Signed)
Pt is having right leg weakness and would like to know if she can have something for exacerbation. Please call and advise  [phone].

## 2014-09-13 NOTE — Telephone Encounter (Signed)
Spoke to patient and suggested to use any [facility] outpatient care unit for a 500 mg Solu-Medrol infusion. My nurse is currently working on this. If this cannot be done I will call in a dose pack as PLAN B . She still needs to have a new image to see if MS exacerbation or not.

## 2014-09-13 NOTE — Telephone Encounter (Signed)
I called pt.  She is c/o waking yesterday with R leg weakness, tingling in R arm.  No incontinence.  Bp 100-120/ 63-83 yesterday and today.  She feels like she is no worse then yesterday.  No other sx (headache, vision, slurred speech).  I asked if it happened suddenly and she stated she woke with this.   She initially stated that her R leg drags.  She has had exacerbations but usually L side. I told her that it could be exacerbation but it could be stroke and recommended to go to ER, that they would be able to get xray , she had MRI recently with was stable for her MS.  She wanted to let provider know.

## 2014-09-13 NOTE — Telephone Encounter (Signed)
I spoke to pt, due to the weakness in her R leg, she does not feel that she can make it to [place] for infusion.   No one else to bring her.  She asked about [facility] for the infusion, or dose pack.

## 2014-09-13 NOTE — Telephone Encounter (Signed)
I called pt back after talking to Dr. Vickey Huger.  Have pt come in for solumedrol 500mg  IV with intrafusion.  Inetta Fermo out today, will be in Friday.   I asked pt to call Friday at 0830 to see about what time is good for [facility].  Written order placed on Tina's desk to call her home #. [phone].  Pt also stated that she will be out of town Sept 24 thru 11-24-14 in [place].

## 2014-09-14 ENCOUNTER — Other Ambulatory Visit: Payer: Self-pay | Admitting: *Deleted

## 2014-09-14 ENCOUNTER — Telehealth: Payer: Self-pay

## 2014-09-14 MED ORDER — METHYLPREDNISOLONE SODIUM SUCC 1000 MG IJ SOLR: INTRAMUSCULAR | Status: DC

## 2014-09-14 NOTE — Telephone Encounter (Signed)
Spoke with Patient and informed her that we have faxed over script to [facility] and they will be in contact with her.  Patient verbalized understanding.

## 2014-09-14 NOTE — Telephone Encounter (Signed)
Patient called requesting to speak with Joy. She can be reached at [phone]

## 2014-09-17 ENCOUNTER — Encounter (HOSPITAL_COMMUNITY): Payer: Self-pay

## 2014-09-17 ENCOUNTER — Encounter (HOSPITAL_COMMUNITY)
Admission: RE | Admit: 2014-09-17 | Discharge: 2014-09-17 | Disposition: A | Discharge status: Home / Self Care | Payer: Medicare Other | Source: Ambulatory Visit | Attending: Neurology | Admitting: Neurology

## 2014-09-17 VITALS — BP 113/73 | HR 81 | Temp 97.8°F | Resp 20 | Ht 65.0 in | Wt 238.0 lb

## 2014-09-17 DIAGNOSIS — Z7982 Long term (current) use of aspirin: Secondary | ICD-10-CM | POA: Diagnosis not present

## 2014-09-17 DIAGNOSIS — Z79899 Other long term (current) drug therapy: Secondary | ICD-10-CM | POA: Insufficient documentation

## 2014-09-17 DIAGNOSIS — Z6841 Body Mass Index (BMI) 40.0 and over, adult: Secondary | ICD-10-CM | POA: Insufficient documentation

## 2014-09-17 DIAGNOSIS — N183 Chronic kidney disease, stage 3 (moderate): Secondary | ICD-10-CM | POA: Insufficient documentation

## 2014-09-17 DIAGNOSIS — G35 Multiple sclerosis: Secondary | ICD-10-CM | POA: Diagnosis not present

## 2014-09-17 LAB — GLUCOSE, CAPILLARY: Glucose-Capillary: 115 mg/dL — ABNORMAL HIGH (ref 65–99)

## 2014-09-17 MED ORDER — METHYLPREDNISOLONE SODIUM SUCC 1000 MG IJ SOLR
500.0000 mg | INTRAMUSCULAR | Status: DC
  Administered 2014-09-17: 500 mg via INTRAVENOUS
  Filled 2014-09-17: qty 4

## 2014-09-17 MED ORDER — METHYLPREDNISOLONE SODIUM SUCC 500 MG IJ SOLR
500.0000 mg | INTRAMUSCULAR | Status: DC
  Filled 2014-09-17: qty 500

## 2014-09-17 MED ORDER — SODIUM CHLORIDE 0.9 % IV SOLN
Freq: Once | INTRAVENOUS | Status: AC
  Administered 2014-09-17: 250 mL via INTRAVENOUS

## 2014-09-17 NOTE — Telephone Encounter (Signed)
This patient received already multiple phone calls from me, she is due to receive her first 500 mg of Solu-Medrol today at any [facility] and 2 more doses the following 2 days each. I agree with her seeing Aundra Millet the coming Thursday.

## 2014-09-17 NOTE — Progress Notes (Signed)
Hep locking iv per dr order

## 2014-09-17 NOTE — Telephone Encounter (Signed)
I spoke to the patient today, who stated that she did not get any effusions on Friday Saturday and Sunday because" [name] doesn't work on the weekend for outpatient infusions". She is scheduled today at 2 PM at the same time tomorrow and the day after to receive 500 mg Solu-Medrol each infusion. She also stated that she felt that she had recovered a little bit over the weekend. I advised her that an MRI of the brain was also scheduled to CFA MS relapse can be visualized and she agreed with this therapy and plan and understood that she will have a revisit appointment very soon.   I will forward this note to my nurse to schedule a revisit with either me or the nurse practitioner within the next 7 business days. CD

## 2014-09-17 NOTE — Telephone Encounter (Signed)
Called and spoke to patient and she is fine with seeing Aundra Millet this coming Thursday. Patient relayed she has her infusion  Monday, Tuesday and Wednesday. I relayed to patient if she needed [place] before Thursday to give the office a call. Patient understood.

## 2014-09-17 NOTE — Discharge Instructions (Signed)
Methylprednisolone Solution for Injection °What is this medicine? °METHYLPREDNISOLONE (meth ill pred NISS oh lone) is a corticosteroid. It is commonly used to treat inflammation of the skin, joints, lungs, and other organs. Common conditions treated include asthma, allergies, and arthritis. It is also used for other conditions, such as blood disorders and diseases of the adrenal glands. °This medicine may be used for other purposes; ask your health care provider or pharmacist if you have questions. °COMMON BRAND NAME(S): A-Methapred, Solu-Medrol °What should I tell my health care provider before I take this medicine? °They need to know if you have any of these conditions: °-cataracts or glaucoma °-Cushing's syndrome °-heart disease °-high blood pressure °-infection including tuberculosis °-low calcium or potassium levels in the blood °-recent surgery °-seizures °-stomach or intestinal disease, including colitis °-threadworms °-thyroid problems °-an unusual or allergic reaction to methylprednisolone, corticosteroids, benzyl alcohol, other medicines, foods, dyes, or preservatives °-pregnant or trying to get pregnant °-breast-feeding °How should I use this medicine? °This medicine is for injection or infusion into a vein. It is also for injection into a muscle. It is given by a health care professional in a hospital or clinic setting. °Talk to your pediatrician regarding the use of this medicine in children. While this drug may be prescribed for selected conditions, precautions do apply. °Overdosage: If you think you have taken too much of this medicine contact a poison control center or emergency room at once. °NOTE: This medicine is only for you. Do not share this medicine with others. °What if I miss a dose? °This does not apply. °What may interact with this medicine? °Do not take this medicine with any of the following medications: °-mifepristone °This medicine may also interact with the following  medications: °-aspirin and aspirin-like medicines °-cyclosporin °-ketoconazole °-phenobarbital °-phenytoin °-rifampin °-tacrolimus °-troleandomycin °-vaccines °-warfarin °This list may not describe all possible interactions. Give your health care provider a list of all the medicines, herbs, non-prescription drugs, or dietary supplements you use. Also tell them if you smoke, drink alcohol, or use illegal drugs. Some items may interact with your medicine. °What should I watch for while using this medicine? °Visit your doctor or health care professional for regular checks on your progress. If you are taking this medicine for a long time, carry an identification card with your name and address, the type and dose of your medicine, and your doctor's name and address. °The medicine may increase your risk of getting an infection. Stay away from people who are sick. Tell your doctor or health care professional if you are around anyone with measles or chickenpox. °You may need to avoid some vaccines. Talk to your health care provider for more information. °If you are going to have surgery, tell your doctor or health care professional that you have taken this medicine within the last twelve months. °Ask your doctor or health care professional about your diet. You may need to lower the amount of salt you eat. °The medicine can increase your blood sugar. If you are a diabetic check with your doctor if you need help adjusting the dose of your diabetic medicine. °What side effects may I notice from receiving this medicine? °Side effects that you should report to your doctor or health care professional as soon as possible: °-allergic reactions like skin rash, itching or hives, swelling of the face, lips, or tongue °-bloody or tarry stools °-changes in vision °-eye pain or bulging eyes °-fever, sore throat, sneezing, cough, or other signs of infection, wounds that will   not heal °-increased thirst °-irregular heartbeat °-muscle  cramps °-pain in hips, back, ribs, arms, shoulders, or legs °-swelling of the ankles, feet, hands °-trouble passing urine or change in the amount of urine °-unusual bleeding or bruising °-unusually weak or tired °-weight gain or weight loss °Side effects that usually do not require medical attention (report to your doctor or health care professional if they continue or are bothersome): °-changes in emotions or moods °-constipation or diarrhea °-headache °-irritation at site where injected °-nausea, vomiting °-skin problems, acne, thin and shiny skin °-trouble sleeping °-unusual hair growth on the face or body °This list may not describe all possible side effects. Call your doctor for medical advice about side effects. You may report side effects to FDA at 1-800-FDA-1088. °Where should I keep my medicine? °This drug is given in a hospital or clinic and will not be stored at home. °NOTE: This sheet is a summary. It may not cover all possible information. If you have questions about this medicine, talk to your doctor, pharmacist, or health care provider. °© 2015, [employer]. (2011-10-13 11:37:16) ° ° °

## 2014-09-18 ENCOUNTER — Telehealth: Payer: Self-pay

## 2014-09-18 ENCOUNTER — Encounter (HOSPITAL_COMMUNITY): Payer: Self-pay

## 2014-09-18 ENCOUNTER — Encounter (HOSPITAL_COMMUNITY)
Admission: RE | Admit: 2014-09-18 | Discharge: 2014-09-18 | Disposition: A | Discharge status: Home / Self Care | Payer: Medicare Other | Source: Ambulatory Visit | Attending: Neurology | Admitting: Neurology

## 2014-09-18 DIAGNOSIS — G35 Multiple sclerosis: Secondary | ICD-10-CM | POA: Diagnosis not present

## 2014-09-18 MED ORDER — SODIUM CHLORIDE 0.9 % IV SOLN
500.0000 mg | Freq: Once | INTRAVENOUS | Status: DC
  Filled 2014-09-18: qty 4

## 2014-09-18 MED ORDER — METHYLPREDNISOLONE SODIUM SUCC 125 MG IJ SOLR
500.0000 mg | Freq: Once | INTRAMUSCULAR | Status: DC
  Administered 2014-09-18: 500 mg via INTRAVENOUS

## 2014-09-18 MED ORDER — SODIUM CHLORIDE 0.9 % IV SOLN
INTRAVENOUS | Status: DC
  Administered 2014-09-18: 250 mL via INTRAVENOUS

## 2014-09-18 NOTE — Telephone Encounter (Signed)
Patient returned call. Lab results given to patient.

## 2014-09-18 NOTE — Telephone Encounter (Signed)
LVM for patient to call the office.  Ok to inform patient that her glucose level was 115.  Normal range is 65-99

## 2014-09-19 ENCOUNTER — Encounter (HOSPITAL_COMMUNITY)
Admission: RE | Admit: 2014-09-19 | Discharge: 2014-09-19 | Disposition: A | Discharge status: Home / Self Care | Payer: Medicare Other | Source: Ambulatory Visit | Attending: Neurology | Admitting: Neurology

## 2014-09-19 DIAGNOSIS — G35 Multiple sclerosis: Secondary | ICD-10-CM | POA: Diagnosis not present

## 2014-09-19 MED ORDER — SODIUM CHLORIDE 0.9 % IV SOLN
Freq: Once | INTRAVENOUS | Status: AC
  Administered 2014-09-19: 200 mL via INTRAVENOUS

## 2014-09-19 MED ORDER — SODIUM CHLORIDE 0.9 % IV SOLN
500.0000 mg | Freq: Once | INTRAVENOUS | Status: AC
  Administered 2014-09-19: 500 mg via INTRAVENOUS
  Filled 2014-09-19: qty 0

## 2014-09-19 MED FILL — Sodium Chloride IV Soln 0.9%: INTRAVENOUS | Qty: 50 | Status: AC

## 2014-09-19 MED FILL — Methylprednisolone Sod Succ For Inj 500 MG (Base Equiv): INTRAMUSCULAR | Qty: 1 | Status: AC

## 2014-09-20 ENCOUNTER — Encounter: Payer: Self-pay | Admitting: Adult Health

## 2014-09-20 ENCOUNTER — Ambulatory Visit (INDEPENDENT_AMBULATORY_CARE_PROVIDER_SITE_OTHER): Payer: Medicare Other | Admitting: Adult Health

## 2014-09-20 VITALS — BP 130/80 | HR 76 | Ht 65.0 in | Wt 236.0 lb

## 2014-09-20 DIAGNOSIS — N3020 Other chronic cystitis without hematuria: Secondary | ICD-10-CM | POA: Diagnosis not present

## 2014-09-20 DIAGNOSIS — R29898 Other symptoms and signs involving the musculoskeletal system: Secondary | ICD-10-CM

## 2014-09-20 DIAGNOSIS — G35 Multiple sclerosis: Secondary | ICD-10-CM

## 2014-09-20 NOTE — Progress Notes (Signed)
[name]: [name] DOB: 06-22-1955  REASON FOR VISIT: follow up- multiple sclerosis HISTORY FROM: [name]  HISTORY OF PRESENT ILLNESS: Miss Hanson is a 59 year old female with a history of multiple sclerosis. She returns today for an evaluation. The [name] is currently taking Betaseron and tolerating it well. She recently had an MS exacerbation with weakness in the right leg. She received 3 IV doses of Solu-Medrol. Her last infusion was yesterday. She reports that after the initial infusion she did see some benefit. She states that she is not back to her baseline but it is improved. She states that this is her first relapse in a year. She states that she has not had Solu-Medrol IV in "over 20 years." She denies any new numbness. Denies any changes with her vision. She uses a Rollator when ambulating. She states that in the last 6 months she has beginning having diarrhea. She has noticed that several foods will trigger this. She has tried to avoid these foods but finds that it limits her diet severely. Her urologist felt that her diabetes was contributing to this. However her endocrinologist did not agree with that. He recommended that she follow-up with a GI specialist. She has not done this yet. [name] reports that she's been having ongoing dry mouth. According to the [name] this is not a new symptoms. She states that her dentist gave her some mouth for her to try but she has not used it yet. She returns today for an evaluation.   HISTORY 08/22/14 Daphine Deutscher): [name], 59 year old female returns for follow-up. She was last seen in this office 01/24/14. She has a history of multiple sclerosis relapsing remitting, versus relapsing progressive. She has been followed at [facility] since 1994, when she presented with weakness, tingling and vision problems. MRI of the brain at that time showed lesions compatible with MS predominantly in the brain stem and posterior fossa. MRI of the cervical spine showed  lesions consistent with MS. She was initiated on Betaseron in November of 1994 and was off of the medication briefly in 2001 due to elevation of LFTs. She has had relapses during hospitalization and pseudo exacerbations with mild persistent gait deficit, slight spastic paraparesis, and a wide-based gait. She denies any difficulty with her Betaseron, no difficulty with site injections. She ambulates with a rolling walker . She has fatigue, urinary frequency, diabetes and high cholesterol. MS symptoms are stable. She has been on Betaseron for quite some time however in talking to her about oral preparations , she does not want to switch to oral preparations at this time . She was recently diagnosed with hypothyroidism. She has chronic kidney disease stage III. She is no longer using her CPAP. No new neurological complaints.   REVIEW OF SYSTEMS: Out of a complete 14 system review of symptoms, the [name] complains only of the following symptoms, and all other reviewed systems are negative.  Appetite change, fatigue, diarrhea, incontinence of bladder, walking difficulty  ALLERGIES: Allergies  Allergen Reactions  . Penicillins     HOME MEDICATIONS: Outpatient Prescriptions Prior to Visit  Medication Sig Dispense Refill  . aspirin 81 MG tablet Take 81 mg by mouth daily.    Marland Kitchen atorvastatin (LIPITOR) 40 MG tablet Take 40 mg by mouth daily.     . baclofen (LIORESAL) 10 MG tablet TAKE 1 TABLET THREE TIMES A DAY 270 tablet 0  . ergocalciferol (VITAMIN D2) 50000 UNITS capsule Take 50,000 Units by mouth once a week.    Marland Kitchen  imipramine (TOFRANIL) 25 MG tablet TAKE 1 TABLET AT BEDTIME 90 tablet 1  . interferon beta-1b (BETASERON) 0.3 MG injection Inject 0.3 mg into the skin every other day.    Marland Kitchen LANTUS SOLOSTAR 100 UNIT/ML SOPN Inject 40 Units into the skin at bedtime.     Marland Kitchen levothyroxine (SYNTHROID, LEVOTHROID) 50 MCG tablet Take 50 mcg by mouth daily before breakfast.     . metFORMIN (GLUCOPHAGE-XR) 500 MG  24 hr tablet Take 500 mg by mouth 2 (two) times daily.     . nitrofurantoin (MACRODANTIN) 100 MG capsule Take 100 mg by mouth. 1 po every other day    . RELION PEN NEEDLES 32G X 4 MM MISC     . Tamsulosin HCl (FLOMAX) 0.4 MG CAPS Take 0.4 mg by mouth daily after breakfast.     . telmisartan (MICARDIS) 20 MG tablet Take 20 mg by mouth daily.     Marland Kitchen tolterodine (DETROL LA) 4 MG 24 hr capsule TAKE 1 CAPSULE DAILY 90 capsule 1  . TOPROL XL 25 MG 24 hr tablet Take 25 mg by mouth daily.     Marland Kitchen trimethoprim (TRIMPEX) 100 MG tablet Take 100 mg by mouth every other day.     . TRULICITY 1.5 MG/0.5ML SOPN Inject 1.5 mg into the skin once a week.     . methylPREDNISolone sodium succinate (SOLU-MEDROL) 1000 MG injection SoluMedrol 500mg  IV daily for 3 consecutive days. Hospital staff to start an IV which may remain in place for all 3 days, then d/c IV after 3rd infusion. ([name] not taking: Reported on 09/20/2014) 1 each 2  . metoprolol tartrate (LOPRESSOR) 25 MG tablet Take 25 mg by mouth daily.     No facility-administered medications prior to visit.    PAST MEDICAL HISTORY: Past Medical History  Diagnosis Date  . Diabetes mellitus without complication   . Hypertension   . Morbid obesity   . High cholesterol   . MS (multiple sclerosis) relapsing remitting  . Severe obesity (BMI >= 40) 10/11/2012    PAST SURGICAL HISTORY: Past Surgical History  Procedure Laterality Date  . Cholecystectomy  8/09  . Left oophorectomy Left 8/11    FAMILY HISTORY: Family History  Problem Relation Age of Onset  . Heart attack Mother   . Diabetes Mother   . Aneurysm Father   . Heart disease Father   . Cancer Maternal Grandmother     SOCIAL HISTORY: Social History   Social History  . Marital Status: Single    Spouse Name: N/A  . Number of Children: 0  . Years of Education: college   Occupational History  . disabled    Social History Main Topics  . Smoking status: Never Smoker   . Smokeless tobacco:  Never Used  . Alcohol Use: No     Comment: quit in 1975  . Drug Use: No  . Sexual Activity: Not on file   Other Topics Concern  . Not on file   Social History Narrative   [name] is single and lives alone.   [name] is right-handed.   [name] has a college education.   [name] does not drink any caffeine.   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      PHYSICAL EXAM  Filed Vitals:   09/20/14 0920  BP: 130/80  Pulse: 76  Height: 5\' 5"  (1.651 m)  Weight: 236 lb (107.049 kg)   Body mass index is 39.27 kg/(m^2).  Generalized: Well developed, in no  acute distress   Neurological examination  Mentation: Alert oriented to time, place, history taking. Follows all commands speech and language fluent Cranial nerve II-XII: Pupils were equal round reactive to light. Extraocular movements were full, visual field were full on confrontational test. Facial sensation and strength were normal. Uvula tongue midline. Head turning and shoulder shrug  were normal and symmetric. Motor: The motor testing reveals 5 over 5 strength in the upper extremities. 5 out of 5 strength in the left lower extremity. The [name] has proximal weakness in the right lower extremity. Good symmetric motor tone is noted throughout.  Sensory: Sensory testing is intact to soft touch on all 4 extremities. No evidence of extinction is noted.  Coordination: Cerebellar testing reveals good finger-nose-finger and  Good heel-to-shin on the left, difficulty on the right due to weakness. Gait and station: [name] is unable to get on the exam table for her assessment. Weakness in the proximal right lower extremity is enabling her from safely doing this. She ambulates with a Rollator. Tandem gait not attempted. Reflexes: Deep tendon reflexes are symmetric and normal bilaterally.   DIAGNOSTIC DATA (LABS, IMAGING, TESTING) - I reviewed [name] records, labs, notes, testing and imaging myself where  available.   ASSESSMENT AND PLAN 59 y.o. year old female  has a past medical history of Diabetes mellitus without complication; Hypertension; Morbid obesity; High cholesterol; MS (multiple sclerosis) (relapsing remitting); and Severe obesity (BMI >= 40) (10/11/2012). here with:  1. Multiple sclerosis 2. Proximal right lower extremity weakness  The [name]'s weakness in the right leg has improved with the IV steroids however she has not returned to her baseline. [name] has been advised to continue on Betaseron. Her latest MRI of the brain did not show any interval changes. She had blood work with her primary care. I have advised her to have this sent to our office. She verbalized understanding. [name] also is having dry mouth, I encouraged the [name] uses over-the-counter mouth wash to help with this. [name] advised that if her symptoms worsen or she develops new symptoms she should let us know. She will follow-up in 3 months or sooner if needed.   Butch Penny, MSN, NP-C 09/20/2014, 9:44 AM [facility] [address], [address] [place], [place] [address] [phone]

## 2014-09-20 NOTE — Patient Instructions (Addendum)
Continue Betaseron  Please have blood work faxed to [place] Try OTC mouthwash to help with dry mouth. If your symptoms does not improve please let us know.

## 2014-09-20 NOTE — Progress Notes (Signed)
I agree with the assessment and plan as directed by NP .The patient is known to me .   Shelton Soler, MD  

## 2014-11-03 ENCOUNTER — Other Ambulatory Visit: Payer: Self-pay | Admitting: Neurology

## 2014-11-28 LAB — HEMOGLOBIN A1C: Hgb A1c MFr Bld: 8.6 % — AB (ref 4.0–6.0)

## 2014-12-06 ENCOUNTER — Encounter: Payer: Self-pay | Admitting: "Endocrinology

## 2014-12-06 ENCOUNTER — Ambulatory Visit (INDEPENDENT_AMBULATORY_CARE_PROVIDER_SITE_OTHER): Payer: Medicare Other | Admitting: "Endocrinology

## 2014-12-06 VITALS — BP 110/79 | HR 84 | Ht 65.0 in | Wt 205.9 lb

## 2014-12-06 DIAGNOSIS — N182 Chronic kidney disease, stage 2 (mild): Secondary | ICD-10-CM

## 2014-12-06 DIAGNOSIS — I10 Essential (primary) hypertension: Secondary | ICD-10-CM

## 2014-12-06 DIAGNOSIS — E039 Hypothyroidism, unspecified: Secondary | ICD-10-CM | POA: Insufficient documentation

## 2014-12-06 DIAGNOSIS — E1122 Type 2 diabetes mellitus with diabetic chronic kidney disease: Secondary | ICD-10-CM

## 2014-12-06 DIAGNOSIS — Z794 Long term (current) use of insulin: Secondary | ICD-10-CM | POA: Diagnosis not present

## 2014-12-06 NOTE — Patient Instructions (Signed)

## 2014-12-06 NOTE — Progress Notes (Signed)
Subjective:    Patient ID: [name], female    DOB: 14-Mar-1955,    Past Medical History  Diagnosis Date  . Diabetes mellitus without complication (HCC)   . Hypertension   . Morbid obesity (HCC)   . High cholesterol   . MS (multiple sclerosis) (HCC) relapsing remitting  . Severe obesity (BMI >= 40) (HCC) 10/11/2012   Past Surgical History  Procedure Laterality Date  . Cholecystectomy  8/09  . Left oophorectomy Left 8/11   Social History   Social History  . Marital Status: Single    Spouse Name: N/A  . Number of Children: 0  . Years of Education: college   Occupational History  . disabled    Social History Main Topics  . Smoking status: Never Smoker   . Smokeless tobacco: Never Used  . Alcohol Use: No     Comment: quit in 1975  . Drug Use: No  . Sexual Activity: Not Asked   Other Topics Concern  . None   Social History Narrative   Patient is single and lives alone.   Patient is right-handed.   Patient has a college education.   Patient does not drink any caffeine.   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   Outpatient Encounter Prescriptions as of 12/06/2014  Medication Sig  . aspirin 81 MG tablet Take 81 mg by mouth daily.  Marland Kitchen atorvastatin (LIPITOR) 40 MG tablet Take 40 mg by mouth daily.   . baclofen (LIORESAL) 10 MG tablet TAKE 1 TABLET THREE TIMES A DAY  . ergocalciferol (VITAMIN D2) 50000 UNITS capsule Take 50,000 Units by mouth once a week.  Marland Kitchen imipramine (TOFRANIL) 25 MG tablet TAKE 1 TABLET AT BEDTIME  . interferon beta-1b (BETASERON) 0.3 MG injection Inject 0.3 mg into the skin every other day.  Marland Kitchen LANTUS SOLOSTAR 100 UNIT/ML SOPN Inject 40 Units into the skin at bedtime.   Marland Kitchen levothyroxine (SYNTHROID, LEVOTHROID) 50 MCG tablet Take 50 mcg by mouth daily before breakfast.   . metFORMIN (GLUCOPHAGE-XR) 500 MG 24 hr tablet Take 500 mg by mouth 2 (two) times daily.   Marland Kitchen RELION PEN NEEDLES 32G X 4 MM MISC   . Tamsulosin HCl (FLOMAX) 0.4  MG CAPS Take 0.4 mg by mouth daily after breakfast.   . telmisartan (MICARDIS) 20 MG tablet Take 20 mg by mouth daily.   Marland Kitchen tolterodine (DETROL LA) 4 MG 24 hr capsule TAKE 1 CAPSULE DAILY  . TOPROL XL 25 MG 24 hr tablet Take 25 mg by mouth daily.   Marland Kitchen trimethoprim (TRIMPEX) 100 MG tablet Take 100 mg by mouth every other day.   . TRULICITY 1.5 MG/0.5ML SOPN Inject 1.5 mg into the skin once a week.   . nitrofurantoin (MACRODANTIN) 100 MG capsule Take 100 mg by mouth. 1 po every other day   No facility-administered encounter medications on file as of 12/06/2014.   ALLERGIES: Allergies  Allergen Reactions  . Penicillins    VACCINATION STATUS:  There is no immunization history on file for this patient.  Diabetes She presents for her follow-up diabetic visit. She has type 2 diabetes mellitus. Onset time: She was diagnosed at approximate age of 52 years. Her disease course has been worsening. There are no hypoglycemic associated symptoms. Pertinent negatives for hypoglycemia include no confusion, headaches, pallor or seizures. Associated symptoms include fatigue, polydipsia and polyuria. Pertinent negatives for diabetes include no chest pain and no polyphagia. There are no hypoglycemic complications. Symptoms  are worsening. Diabetic complications include nephropathy and peripheral neuropathy. Risk factors for coronary artery disease include diabetes mellitus, dyslipidemia, hypertension and sedentary lifestyle. Current diabetic treatment includes insulin injections and oral agent (monotherapy). She is compliant with treatment most of the time. Her weight is stable. She is following a generally unhealthy diet. She has had a previous visit with a dietitian. She never participates in exercise. Her home blood glucose trend is increasing steadily. Her overall blood glucose range is >200 mg/dl. An ACE inhibitor/angiotensin II receptor blocker is being taken.  Thyroid Problem Presents for follow-up visit.  Symptoms include fatigue. Patient reports no cold intolerance, diarrhea, heat intolerance or palpitations. The symptoms have been improving. Past treatments include levothyroxine. Her past medical history is significant for hyperlipidemia.  Hyperlipidemia This is a chronic problem. The current episode started more than 1 year ago. Pertinent negatives include no chest pain, myalgias or shortness of breath. Current antihyperlipidemic treatment includes statins.  Hypertension This is a chronic problem. The current episode started more than 1 year ago. Pertinent negatives include no chest pain, headaches, palpitations or shortness of breath. Past treatments include angiotensin blockers. Hypertensive end-organ damage includes a thyroid problem.    6 yrs. Review of Systems  Constitutional: Positive for fatigue. Negative for unexpected weight change.  HENT: Negative for trouble swallowing and voice change.   Eyes: Negative for visual disturbance.  Respiratory: Negative for cough, shortness of breath and wheezing.   Cardiovascular: Negative for chest pain, palpitations and leg swelling.  Gastrointestinal: Negative for nausea, vomiting and diarrhea.  Endocrine: Positive for polydipsia and polyuria. Negative for cold intolerance, heat intolerance and polyphagia.  Musculoskeletal: Negative for myalgias and arthralgias.  Skin: Negative for color change, pallor, rash and wound.  Neurological: Negative for seizures and headaches.  Psychiatric/Behavioral: Negative for suicidal ideas and confusion.    Objective:    BP 110/79 mmHg  Pulse 84  Ht  (1.651 m)  Wt 205 lb 14.4 oz (93.396 kg)  BMI 34.26 kg/m2  SpO2 100%  Wt Readings from Last 3 Encounters:  12/06/14 205 lb 14.4 oz (93.396 kg)  09/20/14 236 lb (107.049 kg)  09/17/14 238 lb (107.956 kg)    Physical Exam  Constitutional: She is oriented to person, place, and time. She appears well-developed.  HENT:  Head: Normocephalic and  atraumatic.  Eyes: EOM are normal.  Neck: Normal range of motion. Neck supple. No tracheal deviation present. No thyromegaly present.  Cardiovascular: Normal rate and regular rhythm.   Pulmonary/Chest: Effort normal and breath sounds normal.  Abdominal: Soft. Bowel sounds are normal. There is no tenderness. There is no guarding.  Musculoskeletal: She exhibits no edema.  She walks with her walker due to multiple sclerosis.  Neurological: She is alert and oriented to person, place, and time. She has normal reflexes. No cranial nerve deficit. Coordination normal.  Skin: Skin is warm and dry. No rash noted. No erythema. No pallor.  Psychiatric: She has a normal mood and affect. Judgment normal.    Results for orders placed or performed during the hospital encounter of 09/17/14  Glucose, capillary  Result Value Ref Range   Glucose-Capillary 115 (H) 65 - 99 mg/dL   Complete Blood Count (Most recent): Lab Results  Component Value Date   WBC 4.9 08/28/2009   HGB 11.9* 08/28/2009   HCT 35.6* 08/28/2009   MCV 87.1 08/28/2009   PLT 286 08/28/2009   Chemistry (most recent): Lab Results  Component Value Date   NA 139 08/28/2009  K 4.4 08/28/2009   CL 104 08/28/2009   CO2 28 08/28/2009   BUN 18 08/28/2009   CREATININE 0.80 08/28/2009   Diabetic Labs (most recent): No results found for: HGBA1C Lipid profile (most recent): Lab Results  Component Value Date   TRIG 77 09/08/2007         Assessment & Plan:   1. Type 2 diabetes mellitus with stage 2 chronic kidney disease, with long-term current use of insulin (HCC)  Her diabetes is  complicated by CK D and patient remains at a high risk for more acute and chronic complications of diabetes which include CAD, CVA, CKD, retinopathy, and neuropathy. These are all discussed in detail with the patient.  Patient came with controlled fasting glucose profile, however  recent A1c is higher at 8.6 %.  Glucose logs and insulin administration  records pertaining to this visit,  to be scanned into patient's records.  Recent labs reviewed.   - I have re-counseled the patient on diet management and weight loss  by adopting a carbohydrate restricted / protein rich  Diet.  - Suggestion is made for patient to avoid simple carbohydrates   from their diet including Cakes , Desserts, Ice Cream,  Soda (  diet and regular) , Sweet Tea , Candies,  Chips, Cookies, Artificial Sweeteners,   and "Sugar-free" Products .  This will help patient to have stable blood glucose profile and potentially avoid unintended  Weight gain.  - Patient is advised to stick to a routine mealtimes to eat 3 meals  a day and avoid unnecessary snacks ( to snack only to correct hypoglycemia).  - The patient  Has been  scheduled with Norm Salt, RDN, CDE for individualized DM education.  - I have approached patient with the following individualized plan to manage diabetes and patient agrees.  -I will continue Lantus 40 units QHS, associated with monitoring of glucose QAM.  -She is asked to start monitoring BG AC and HS , present her logs and meter in 1 week. she may need bolus insulin for now based on her readings.  -Patient is encouraged to call clinic for blood glucose levels less than 70 or above 300 mg /dl. -For better insulin sensitivity I will continue MTF at lower dose of 500mg  po BID. -Due to CKD patient is not a candidate for SGLT2 inhibitors. -Patient will continue Trulicity 1.5mg  weekly. -Target numbers for A1c, LDL, HDL, Triglycerides, Waist Circumference were discussed in detail.   - Patient specific target  for A1c; LDL, HDL, Triglycerides, and  Waist Circumference were discussed in detail.  2) BP/HTN: Controlled. Continue current medications including ACEI/ARB. 3) Lipids/HPL:  continue statins. 4)  Weight/Diet: CDE consult in progress, exercise, and carbohydrates information provided. 5)  Primary hypothyroidism -I advised her to continue  levothyroxine 50 g by mouth every morning.  - We discussed about correct intake of levothyroxine, at fasting, with water, separated by at least 30 minutes from breakfast, and separated by more than 4 hours from calcium, iron, multivitamins, acid reflux medications (PPIs). -Patient is made aware of the fact that thyroid hormone replacement is needed for life, dose to be adjusted by periodic monitoring of thyroid function tests.  6) Chronic Care/Health Maintenance:  -Patient is on ACEI/ARB and Statin medications and encouraged to continue to follow up with Ophthalmology, Podiatrist at least yearly or according to recommendations, and advised to  stay away from smoking. I have recommended yearly flu vaccine and pneumonia vaccination at least every 5 years; moderate  intensity exercise for up to 150 minutes weekly; and  sleep for at least 7 hours a day.  I advised patient to maintain close follow up with their PCP for primary care needs.  Patient is asked to bring meter and  blood glucose logs during their next visit.   Follow up plan: Return in about 1 week (around 12/13/2014) for diabetes, high blood pressure, high cholesterol, follow up with meter and logs- no labs.  Marquis Lunch, MD Phone: [phone]  Fax: [phone]   12/06/2014, 4:20 PM

## 2014-12-13 ENCOUNTER — Encounter: Payer: Self-pay | Admitting: "Endocrinology

## 2014-12-13 ENCOUNTER — Ambulatory Visit (INDEPENDENT_AMBULATORY_CARE_PROVIDER_SITE_OTHER): Payer: Medicare Other | Admitting: "Endocrinology

## 2014-12-13 VITALS — BP 110/77 | HR 102 | Ht 65.0 in | Wt 231.8 lb

## 2014-12-13 DIAGNOSIS — E559 Vitamin D deficiency, unspecified: Secondary | ICD-10-CM | POA: Insufficient documentation

## 2014-12-13 DIAGNOSIS — E1122 Type 2 diabetes mellitus with diabetic chronic kidney disease: Secondary | ICD-10-CM

## 2014-12-13 DIAGNOSIS — E785 Hyperlipidemia, unspecified: Secondary | ICD-10-CM

## 2014-12-13 DIAGNOSIS — E039 Hypothyroidism, unspecified: Secondary | ICD-10-CM | POA: Diagnosis not present

## 2014-12-13 DIAGNOSIS — E782 Mixed hyperlipidemia: Secondary | ICD-10-CM | POA: Insufficient documentation

## 2014-12-13 DIAGNOSIS — N182 Chronic kidney disease, stage 2 (mild): Secondary | ICD-10-CM | POA: Diagnosis not present

## 2014-12-13 DIAGNOSIS — Z794 Long term (current) use of insulin: Secondary | ICD-10-CM

## 2014-12-13 DIAGNOSIS — I10 Essential (primary) hypertension: Secondary | ICD-10-CM | POA: Diagnosis not present

## 2014-12-13 NOTE — Progress Notes (Signed)
Subjective:    Patient ID: [name], female    DOB: [birth year],  Margaret Espy, MD   Past Medical History  Diagnosis Date  . Diabetes mellitus without complication (HCC)   . Hypertension   . Morbid obesity (HCC)   . High cholesterol   . MS (multiple sclerosis) (HCC) relapsing remitting  . Severe obesity (BMI >= 40) (HCC) 10/11/2012   Past Surgical History  Procedure Laterality Date  . Cholecystectomy  8/09  . Left oophorectomy Left 8/11   Social History   Social History  . Marital Status: Single    Spouse Name: N/A  . Number of Children: 0  . Years of Education: college   Occupational History  . disabled    Social History Main Topics  . Smoking status: Never Smoker   . Smokeless tobacco: Never Used  . Alcohol Use: No     Comment: quit in 1975  . Drug Use: No  . Sexual Activity: Not Asked   Other Topics Concern  . None   Social History Narrative   Patient is single and lives alone.   Patient is right-handed.   Patient has a college education.   Patient does not drink any caffeine.   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   Outpatient Encounter Prescriptions as of 12/13/2014  Medication Sig  . aspirin 81 MG tablet Take 81 mg by mouth daily.  Marland Kitchen atorvastatin (LIPITOR) 40 MG tablet Take 40 mg by mouth daily.   . baclofen (LIORESAL) 10 MG tablet TAKE 1 TABLET THREE TIMES A DAY  . ergocalciferol (VITAMIN D2) 50000 UNITS capsule Take 50,000 Units by mouth once a week.  Marland Kitchen imipramine (TOFRANIL) 25 MG tablet TAKE 1 TABLET AT BEDTIME  . interferon beta-1b (BETASERON) 0.3 MG injection Inject 0.3 mg into the skin every other day.  Marland Kitchen LANTUS SOLOSTAR 100 UNIT/ML SOPN Inject 44 Units into the skin at bedtime.  Marland Kitchen levothyroxine (SYNTHROID, LEVOTHROID) 50 MCG tablet Take 50 mcg by mouth daily before breakfast.   . metFORMIN (GLUCOPHAGE-XR) 500 MG 24 hr tablet Take 500 mg by mouth 2 (two) times daily.   . nitrofurantoin (MACRODANTIN) 100 MG capsule  Take 100 mg by mouth. 1 po every other day  . RELION PEN NEEDLES 32G X 4 MM MISC   . Tamsulosin HCl (FLOMAX) 0.4 MG CAPS Take 0.4 mg by mouth daily after breakfast.   . telmisartan (MICARDIS) 20 MG tablet Take 20 mg by mouth daily.   Marland Kitchen tolterodine (DETROL LA) 4 MG 24 hr capsule TAKE 1 CAPSULE DAILY  . TOPROL XL 25 MG 24 hr tablet Take 25 mg by mouth daily.   Marland Kitchen trimethoprim (TRIMPEX) 100 MG tablet Take 100 mg by mouth every other day.   . TRULICITY 1.5 MG/0.5ML SOPN Inject 1.5 mg into the skin once a week.    No facility-administered encounter medications on file as of 12/13/2014.   ALLERGIES: Allergies  Allergen Reactions  . Penicillins    VACCINATION STATUS:  There is no immunization history on file for this patient.  Diabetes She presents for her follow-up diabetic visit. She has type 2 diabetes mellitus. Onset time: She was diagnosed at approximate age of 52 years. Her disease course has been improving. There are no hypoglycemic associated symptoms. Pertinent negatives for hypoglycemia include no confusion, headaches, pallor or seizures. Associated symptoms include fatigue. Pertinent negatives for diabetes include no chest pain, no polydipsia, no polyphagia and no polyuria. There are no  hypoglycemic complications. Symptoms are improving. Diabetic complications include nephropathy and peripheral neuropathy. Risk factors for coronary artery disease include diabetes mellitus, dyslipidemia, hypertension and sedentary lifestyle. Current diabetic treatment includes insulin injections and oral agent (monotherapy). She is compliant with treatment most of the time. Her weight is stable. She is following a generally unhealthy diet. She has had a previous visit with a dietitian. She never participates in exercise. Her home blood glucose trend is decreasing steadily. Her overall blood glucose range is 140-180 mg/dl. An ACE inhibitor/angiotensin II receptor blocker is being taken.  Thyroid  Problem Presents for follow-up visit. Symptoms include fatigue. Patient reports no cold intolerance, diarrhea, heat intolerance or palpitations. The symptoms have been improving. Past treatments include levothyroxine. Her past medical history is significant for hyperlipidemia.  Hyperlipidemia This is a chronic problem. The current episode started more than 1 year ago. Pertinent negatives include no chest pain, myalgias or shortness of breath. Current antihyperlipidemic treatment includes statins.  Hypertension This is a chronic problem. The current episode started more than 1 year ago. Pertinent negatives include no chest pain, headaches, palpitations or shortness of breath. Past treatments include angiotensin blockers. Hypertensive end-organ damage includes a thyroid problem.     Review of Systems  Constitutional: Positive for fatigue. Negative for unexpected weight change.  HENT: Negative for trouble swallowing and voice change.   Eyes: Negative for visual disturbance.  Respiratory: Negative for cough, shortness of breath and wheezing.   Cardiovascular: Negative for chest pain, palpitations and leg swelling.  Gastrointestinal: Negative for nausea, vomiting and diarrhea.  Endocrine: Negative for cold intolerance, heat intolerance, polydipsia, polyphagia and polyuria.  Musculoskeletal: Negative for myalgias and arthralgias.  Skin: Negative for color change, pallor, rash and wound.  Neurological: Negative for seizures and headaches.  Psychiatric/Behavioral: Negative for suicidal ideas and confusion.    Objective:    BP 110/77 mmHg  Pulse 102  Ht  (1.651 m)  Wt 231 lb 12.8 oz (105.144 kg)  BMI 38.57 kg/m2  SpO2 98%  Wt Readings from Last 3 Encounters:  12/13/14 231 lb 12.8 oz (105.144 kg)  12/06/14 205 lb 14.4 oz (93.396 kg)  09/20/14 236 lb (107.049 kg)    Physical Exam  Constitutional: She is oriented to person, place, and time. She appears well-developed.  HENT:  Head:  Normocephalic and atraumatic.  Eyes: EOM are normal.  Neck: Normal range of motion. Neck supple. No tracheal deviation present. No thyromegaly present.  Cardiovascular: Normal rate and regular rhythm.   Pulmonary/Chest: Effort normal and breath sounds normal.  Abdominal: Soft. Bowel sounds are normal. There is no tenderness. There is no guarding.  Musculoskeletal: She exhibits no edema.  She walks with her walker due to multiple sclerosis.  Neurological: She is alert and oriented to person, place, and time. She has normal reflexes. No cranial nerve deficit. Coordination normal.  Skin: Skin is warm and dry. No rash noted. No erythema. No pallor.  Psychiatric: She has a normal mood and affect. Judgment normal.    Results for orders placed or performed in visit on 12/06/14  Hemoglobin A1c  Result Value Ref Range   Hgb A1c MFr Bld 8.6 (A) 4.0 - 6.0 %   Complete Blood Count (Most recent): Lab Results  Component Value Date   WBC 4.9 08/28/2009   HGB 11.9* 08/28/2009   HCT 35.6* 08/28/2009   MCV 87.1 08/28/2009   PLT 286 08/28/2009   Chemistry (most recent): Lab Results  Component Value Date   NA 139  08/28/2009   K 4.4 08/28/2009   CL 104 08/28/2009   CO2 28 08/28/2009   BUN 18 08/28/2009   CREATININE 0.80 08/28/2009   Diabetic Labs (most recent): Lab Results  Component Value Date   HGBA1C 8.6* 11/28/2014   Lipid profile (most recent): Lab Results  Component Value Date   TRIG 77 09/08/2007         Assessment & Plan:   1. Type 2 diabetes mellitus with stage 2 chronic kidney disease, with long-term current use of insulin (HCC)  Her diabetes is  complicated by CK D and patient remains at a high risk for more acute and chronic complications of diabetes which include CAD, CVA, CKD, retinopathy, and neuropathy. These are all discussed in detail with the patient.  Patient came with controlled glucose profile, however  recent A1c is higher at 8.6 %.  Glucose logs and insulin  administration records pertaining to this visit,  to be scanned into patient's records.  Recent labs reviewed.   - I have re-counseled the patient on diet management and weight loss  by adopting a carbohydrate restricted / protein rich  Diet.  - Suggestion is made for patient to avoid simple carbohydrates   from their diet including Cakes , Desserts, Ice Cream,  Soda (  diet and regular) , Sweet Tea , Candies,  Chips, Cookies, Artificial Sweeteners,   and "Sugar-free" Products .  This will help patient to have stable blood glucose profile and potentially avoid unintended  Weight gain.  - Patient is advised to stick to a routine mealtimes to eat 3 meals  a day and avoid unnecessary snacks ( to snack only to correct hypoglycemia).  - The patient  Has been  scheduled with Norm Salt, RDN, CDE for individualized DM education.  - I have approached patient with the following individualized plan to manage diabetes and patient agrees.  -I will increase Lantus 40 units QHS, associated with monitoring of glucose QAM.  -She will not need prandial insulin for now .  -Patient is encouraged to call clinic for blood glucose levels less than 70 or above 300 mg /dl. -For better insulin sensitivity I will continue MTF at lower dose of 500mg  po BID. -Due to CKD patient is not a candidate for SGLT2 inhibitors. -Patient will continue Trulicity 1.5mg  weekly. -Target numbers for A1c, LDL, HDL, Triglycerides, Waist Circumference were discussed in detail.   - Patient specific target  for A1c; LDL, HDL, Triglycerides, and  Waist Circumference were discussed in detail.  2) BP/HTN: Controlled. Continue current medications including ACEI/ARB. 3) Lipids/HPL:  continue statins. 4)  Weight/Diet: CDE consult in progress, exercise, and carbohydrates information provided. 5)  Primary hypothyroidism -I advised her to continue levothyroxine 50 g by mouth every morning.  - We discussed about correct intake of  levothyroxine, at fasting, with water, separated by at least 30 minutes from breakfast, and separated by more than 4 hours from calcium, iron, multivitamins, acid reflux medications (PPIs). -Patient is made aware of the fact that thyroid hormone replacement is needed for life, dose to be adjusted by periodic monitoring of thyroid function tests.  6) Chronic Care/Health Maintenance:  -Patient is on ACEI/ARB and Statin medications and encouraged to continue to follow up with Ophthalmology, Podiatrist at least yearly or according to recommendations, and advised to  stay away from smoking. I have recommended yearly flu vaccine and pneumonia vaccination at least every 5 years; moderate intensity exercise for up to 150 minutes weekly; and  sleep  for at least 7 hours a day.  - 25 minutes of time was spent on the care of this patient , 50% of which was applied for counseling on diabetes complications and their preventions.  I advised patient to maintain close follow up with their PCP for primary care needs.  Patient is asked to bring meter and  blood glucose logs during their next visit.   Follow up plan: Return in about 11 weeks (around 02/28/2015) for diabetes, high blood pressure, high cholesterol, underactive thyroid, follow up with pre-visit labs, meter, and logs.  Marquis Lunch, MD Phone: [phone]  Fax: [phone]   12/13/2014, 9:52 PM

## 2014-12-13 NOTE — Patient Instructions (Signed)

## 2014-12-18 ENCOUNTER — Ambulatory Visit (INDEPENDENT_AMBULATORY_CARE_PROVIDER_SITE_OTHER): Payer: Medicare Other | Admitting: Adult Health

## 2014-12-18 ENCOUNTER — Encounter: Payer: Self-pay | Admitting: Adult Health

## 2014-12-18 VITALS — BP 106/72 | HR 84 | Ht 65.0 in | Wt 231.5 lb

## 2014-12-18 DIAGNOSIS — G35 Multiple sclerosis: Secondary | ICD-10-CM

## 2014-12-18 DIAGNOSIS — R269 Unspecified abnormalities of gait and mobility: Secondary | ICD-10-CM

## 2014-12-18 DIAGNOSIS — Z5181 Encounter for therapeutic drug level monitoring: Secondary | ICD-10-CM

## 2014-12-18 NOTE — Progress Notes (Signed)
PATIENT: [name] DOB: 1955-06-25  REASON FOR VISIT: follow up- multiple sclerosis HISTORY FROM: patient  HISTORY OF PRESENT ILLNESS: [name] is a 59 year old female with history of multiple sclerosis. She returns today for follow-up visit. She continues to take Betaseron and tolerates it well. She denies any new numbness or weakness. Denies any changes with her gait or balance. She continues to use a Rollator when ambulating. She states that when she gets really tired  she will drag that right leg. Denies any changes with her bowels or bladder. She states that she continues to get diarrhea at least once a week. She has now identified certain foods that trigger this. She has an appointment with her primary care in January. Overall the patient feels that she is doing well. She returns today for an evaluation.  HISTORY 09/20/14: [name] is a 59 year old female with a history of multiple sclerosis. She returns today for an evaluation. The patient is currently taking Betaseron and tolerating it well. She recently had an Margaret exacerbation with weakness in the right leg. She received 3 IV doses of Solu-Medrol. Her last infusion was yesterday. She reports that after the initial infusion she did see some benefit. She states that she is not back to her baseline but it is improved. She states that this is her first relapse in a year. She states that she has not had Solu-Medrol IV in "over 20 years." She denies any new numbness. Denies any changes with her vision. She uses a Rollator when ambulating. She states that in the last 6 months she has beginning having diarrhea. She has noticed that several foods will trigger this. She has tried to avoid these foods but finds that it limits her diet severely. Her urologist felt that her diabetes was contributing to this. However her endocrinologist did not agree with that. He recommended that she follow-up with a GI specialist. She has not done this yet. Patient  reports that she's been having ongoing dry mouth. According to the patient this is not a new symptoms. She states that her dentist gave her some mouth for her to try but she has not used it yet. She returns today for an evaluation  REVIEW OF SYSTEMS: Out of a complete 14 system review of symptoms, the patient complains only of the following symptoms, and all other reviewed systems are negative.  Fatigue, diarrhea, urgency  ALLERGIES: Allergies  Allergen Reactions  . Penicillins     HOME MEDICATIONS: Outpatient Prescriptions Prior to Visit  Medication Sig Dispense Refill  . aspirin 81 MG tablet Take 81 mg by mouth daily.    Marland Kitchen atorvastatin (LIPITOR) 40 MG tablet Take 40 mg by mouth daily.     . baclofen (LIORESAL) 10 MG tablet TAKE 1 TABLET THREE TIMES A DAY 270 tablet 1  . ergocalciferol (VITAMIN D2) 50000 UNITS capsule Take 50,000 Units by mouth once a week.    Marland Kitchen imipramine (TOFRANIL) 25 MG tablet TAKE 1 TABLET AT BEDTIME 90 tablet 1  . interferon beta-1b (BETASERON) 0.3 MG injection Inject 0.3 mg into the skin every other day.    Marland Kitchen LANTUS SOLOSTAR 100 UNIT/ML SOPN Inject 44 Units into the skin at bedtime.    Marland Kitchen levothyroxine (SYNTHROID, LEVOTHROID) 50 MCG tablet Take 50 mcg by mouth daily before breakfast.     . metFORMIN (GLUCOPHAGE-XR) 500 MG 24 hr tablet Take 500 mg by mouth 2 (two) times daily.     Marland Kitchen RELION PEN NEEDLES 32G  X 4 MM MISC     . Tamsulosin HCl (FLOMAX) 0.4 MG CAPS Take 0.4 mg by mouth daily after breakfast.     . telmisartan (MICARDIS) 20 MG tablet Take 20 mg by mouth daily.     Marland Kitchen tolterodine (DETROL LA) 4 MG 24 hr capsule TAKE 1 CAPSULE DAILY 90 capsule 1  . TOPROL XL 25 MG 24 hr tablet Take 25 mg by mouth daily.     . TRULICITY 1.5 MG/0.5ML SOPN Inject 1.5 mg into the skin once a week.     . nitrofurantoin (MACRODANTIN) 100 MG capsule Take 100 mg by mouth. 1 po every other day    . trimethoprim (TRIMPEX) 100 MG tablet Take 100 mg by mouth every other day.      No  facility-administered medications prior to visit.    PAST MEDICAL HISTORY: Past Medical History  Diagnosis Date  . Diabetes mellitus without complication (HCC)   . Hypertension   . Morbid obesity (HCC)   . High cholesterol   . Margaret (multiple sclerosis) (HCC) relapsing remitting  . Severe obesity (BMI >= 40) (HCC) 10/11/2012    PAST SURGICAL HISTORY: Past Surgical History  Procedure Laterality Date  . Cholecystectomy  8/09  . Left oophorectomy Left 8/11    FAMILY HISTORY: Family History  Problem Relation Age of Onset  . Heart attack Mother   . Diabetes Mother   . Aneurysm Father   . Heart disease Father   . Cancer Maternal Grandmother     SOCIAL HISTORY: Social History   Social History  . Marital Status: Single    Spouse Name: N/A  . Number of Children: 0  . Years of Education: college   Occupational History  . disabled    Social History Main Topics  . Smoking status: Never Smoker   . Smokeless tobacco: Never Used  . Alcohol Use: No     Comment: quit in 1975  . Drug Use: No  . Sexual Activity: Not on file   Other Topics Concern  . Not on file   Social History Narrative   Patient is single and lives alone.   Patient is right-handed.   Patient has a college education.   Patient does not drink any caffeine.   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      PHYSICAL EXAM  Filed Vitals:   12/18/14 0928  BP: 106/72  Pulse: 84  Height: 5\' 5"  (1.651 m)  Weight: 231 lb 8 oz (105.008 kg)   Body mass index is 38.52 kg/(m^2).  Generalized: Well developed, in no acute distress   Neurological examination  Mentation: Alert oriented to time, place, history taking. Follows all commands speech and language fluent Cranial nerve II-XII: Pupils were equal round reactive to light. Extraocular movements were full, visual field were full on confrontational test. Facial sensation and strength were normal. Uvula tongue midline. Head turning and shoulder shrug   were normal and symmetric. Motor: The motor testing reveals 5 over 5 strength in the upper extremities and left lower she re-. 4 over 5 strength in the right lower extremity.Peri [place] symmetric motor tone is noted throughout.  Sensory: Sensory testing is intact to soft touch on all 4 extremities. No evidence of extinction is noted.  Coordination: Cerebellar testing reveals good finger-nose-finger and heel-to-shin bilaterally.  Gait and station: Patient uses a Rollator when ambulating. Patient slightly drags the right leg when ambulating. Tandem gait not attempted. Reflexes: Deep tendon reflexes are symmetric  and normal bilaterally.   DIAGNOSTIC DATA (LABS, IMAGING, TESTING) - I reviewed patient records, labs, notes, testing and imaging myself where available.  ASSESSMENT AND PLAN 59 y.o. year old female  has a past medical history of Diabetes mellitus without complication (HCC); Hypertension; Morbid obesity (HCC); High cholesterol; Margaret (multiple sclerosis) (HCC) (relapsing remitting); and Severe obesity (BMI >= 40) (HCC) (10/11/2012). here with:  1. Multiple sclerosis 2. Abnormality of gait  Overall the patient is doing well. She will continue on Betaseron. I will check blood work today. Patient advised that if her symptoms worsen or she develops new symptoms she should let us know. She will follow-up in 6 months with Dr. Vergia Alcon, MSN, NP-C 12/18/2014, 9:48 AM [facility] [address], [address] [place], [place] [address] [phone]

## 2014-12-18 NOTE — Patient Instructions (Signed)
Continue Betaseron I will check blood work today If your symptoms worsen or you develop new symptoms please let us know.

## 2014-12-18 NOTE — Progress Notes (Signed)
I agree with the assessment and plan as directed by NP .The patient is known to me .   Lahari Suttles, MD  

## 2014-12-19 ENCOUNTER — Telehealth: Payer: Self-pay

## 2014-12-19 LAB — COMPREHENSIVE METABOLIC PANEL
ALBUMIN: 3.9 g/dL (ref 3.5–5.5)
ALT: 28 IU/L (ref 0–32)
AST: 20 IU/L (ref 0–40)
Albumin/Globulin Ratio: 1.4 (ref 1.1–2.5)
Alkaline Phosphatase: 101 IU/L (ref 39–117)
BUN / CREAT RATIO: 19 (ref 9–23)
BUN: 19 mg/dL (ref 6–24)
Bilirubin Total: 0.3 mg/dL (ref 0.0–1.2)
CALCIUM: 9.2 mg/dL (ref 8.7–10.2)
CO2: 23 mmol/L (ref 18–29)
CREATININE: 0.98 mg/dL (ref 0.57–1.00)
Chloride: 102 mmol/L (ref 97–106)
GFR calc Af Amer: 75 mL/min/{1.73_m2} (ref >59)
GFR, EST NON AFRICAN AMERICAN: 65 mL/min/{1.73_m2} (ref >59)
Globulin, Total: 2.8 g/dL (ref 1.5–4.5)
Glucose: 102 mg/dL — ABNORMAL HIGH (ref 65–99)
Potassium: 4.6 mmol/L (ref 3.5–5.2)
SODIUM: 142 mmol/L (ref 136–144)
Total Protein: 6.7 g/dL (ref 6.0–8.5)

## 2014-12-19 LAB — CBC WITH DIFFERENTIAL/PLATELET
BASOS: 0 %
Basophils Absolute: 0 10*3/uL (ref 0.0–0.2)
EOS (ABSOLUTE): 0.2 10*3/uL (ref 0.0–0.4)
EOS: 3 %
HEMATOCRIT: 35.4 % (ref 34.0–46.6)
HEMOGLOBIN: 11.5 g/dL (ref 11.1–15.9)
IMMATURE GRANULOCYTES: 0 %
Immature Grans (Abs): 0 10*3/uL (ref 0.0–0.1)
Lymphocytes Absolute: 1.2 10*3/uL (ref 0.7–3.1)
Lymphs: 18 %
MCH: 26.4 pg — ABNORMAL LOW (ref 26.6–33.0)
MCHC: 32.5 g/dL (ref 31.5–35.7)
MCV: 81 fL (ref 79–97)
MONOCYTES: 11 %
MONOS ABS: 0.7 10*3/uL (ref 0.1–0.9)
NEUTROS PCT: 68 %
Neutrophils Absolute: 4.4 10*3/uL (ref 1.4–7.0)
Platelets: 343 10*3/uL (ref 150–379)
RBC: 4.36 x10E6/uL (ref 3.77–5.28)
RDW: 15.1 % (ref 12.3–15.4)
WBC: 6.5 10*3/uL (ref 3.4–10.8)

## 2014-12-19 NOTE — Telephone Encounter (Signed)
I called the patient and relayed results. 

## 2014-12-19 NOTE — Telephone Encounter (Signed)
-----   Message from Butch Penny, NP sent at 12/19/2014  7:46 AM EST ----- Lab work is normal. Please call patient.

## 2014-12-24 ENCOUNTER — Other Ambulatory Visit: Payer: Self-pay | Admitting: Neurology

## 2014-12-26 ENCOUNTER — Other Ambulatory Visit: Payer: Self-pay | Admitting: *Deleted

## 2014-12-26 MED ORDER — DULAGLUTIDE 1.5 MG/0.5ML ~~LOC~~ SOAJ: 1.5000 mg | SUBCUTANEOUS | Status: DC

## 2015-01-22 ENCOUNTER — Other Ambulatory Visit: Payer: Self-pay | Admitting: *Deleted

## 2015-01-22 ENCOUNTER — Telehealth: Payer: Self-pay | Admitting: Nurse Practitioner

## 2015-01-22 DIAGNOSIS — Z9181 History of falling: Secondary | ICD-10-CM

## 2015-01-22 DIAGNOSIS — R262 Difficulty in walking, not elsewhere classified: Secondary | ICD-10-CM

## 2015-01-22 DIAGNOSIS — R269 Unspecified abnormalities of gait and mobility: Secondary | ICD-10-CM

## 2015-01-22 DIAGNOSIS — G35 Multiple sclerosis: Secondary | ICD-10-CM

## 2015-01-22 NOTE — Progress Notes (Signed)
Order placed for Rollator.   MMillikan, NP out of office.  Sent to Dr. Vickey Huger.

## 2015-01-22 NOTE — Telephone Encounter (Signed)
Order placed

## 2015-01-22 NOTE — Telephone Encounter (Signed)
Pt called requesting RX for rolling walker with seat. Pt requesting to be sent to [facility] in [place]. She said her's is 57 yrs old and would like a new one. She would like this in the next few days please.

## 2015-01-23 ENCOUNTER — Other Ambulatory Visit: Payer: Self-pay | Admitting: *Deleted

## 2015-01-23 DIAGNOSIS — G35 Multiple sclerosis: Secondary | ICD-10-CM

## 2015-01-23 DIAGNOSIS — R269 Unspecified abnormalities of gait and mobility: Secondary | ICD-10-CM

## 2015-01-23 DIAGNOSIS — R262 Difficulty in walking, not elsewhere classified: Secondary | ICD-10-CM

## 2015-01-23 NOTE — Addendum Note (Signed)
Addended byHermenia Fiscal on: 01/23/2015 12:04 PM   Modules accepted: Orders

## 2015-01-23 NOTE — Progress Notes (Signed)
Redid the order for rollator walker with seat.

## 2015-02-08 ENCOUNTER — Other Ambulatory Visit: Payer: Self-pay | Admitting: Neurology

## 2015-02-14 DIAGNOSIS — I10 Essential (primary) hypertension: Secondary | ICD-10-CM | POA: Diagnosis not present

## 2015-02-14 DIAGNOSIS — N183 Chronic kidney disease, stage 3 (moderate): Secondary | ICD-10-CM | POA: Diagnosis not present

## 2015-02-14 DIAGNOSIS — R197 Diarrhea, unspecified: Secondary | ICD-10-CM | POA: Diagnosis not present

## 2015-02-14 DIAGNOSIS — Z23 Encounter for immunization: Secondary | ICD-10-CM | POA: Diagnosis not present

## 2015-02-14 DIAGNOSIS — N2581 Secondary hyperparathyroidism of renal origin: Secondary | ICD-10-CM | POA: Diagnosis not present

## 2015-02-19 ENCOUNTER — Telehealth: Payer: Self-pay

## 2015-02-19 NOTE — Telephone Encounter (Signed)
[employer] [place] Patient [employer] has approved this patient for their Betaseron program effective until 01/26/2016 Ref # [id number].  If there are any questions, Sabino Niemann, client manager,may be reached at [phone] option 2.

## 2015-02-21 DIAGNOSIS — N2581 Secondary hyperparathyroidism of renal origin: Secondary | ICD-10-CM | POA: Diagnosis not present

## 2015-02-21 DIAGNOSIS — I129 Hypertensive chronic kidney disease with stage 1 through stage 4 chronic kidney disease, or unspecified chronic kidney disease: Secondary | ICD-10-CM | POA: Diagnosis not present

## 2015-02-21 DIAGNOSIS — N183 Chronic kidney disease, stage 3 (moderate): Secondary | ICD-10-CM | POA: Diagnosis not present

## 2015-02-21 DIAGNOSIS — D631 Anemia in chronic kidney disease: Secondary | ICD-10-CM | POA: Diagnosis not present

## 2015-02-22 ENCOUNTER — Encounter: Payer: Self-pay | Admitting: Gastroenterology

## 2015-02-25 ENCOUNTER — Ambulatory Visit: Payer: Medicare Other | Admitting: Neurology

## 2015-03-04 ENCOUNTER — Ambulatory Visit: Payer: Medicare Other | Admitting: "Endocrinology

## 2015-03-05 ENCOUNTER — Ambulatory Visit (INDEPENDENT_AMBULATORY_CARE_PROVIDER_SITE_OTHER): Payer: Medicare Other | Admitting: Nurse Practitioner

## 2015-03-05 ENCOUNTER — Other Ambulatory Visit: Payer: Self-pay

## 2015-03-05 ENCOUNTER — Encounter: Payer: Self-pay | Admitting: Nurse Practitioner

## 2015-03-05 VITALS — BP 99/72 | HR 97 | Temp 97.9°F | Ht 64.0 in | Wt 226.4 lb

## 2015-03-05 DIAGNOSIS — R634 Abnormal weight loss: Secondary | ICD-10-CM | POA: Diagnosis not present

## 2015-03-05 DIAGNOSIS — R197 Diarrhea, unspecified: Secondary | ICD-10-CM | POA: Insufficient documentation

## 2015-03-05 LAB — CBC WITH DIFFERENTIAL/PLATELET
BASOS ABS: 0 10*3/uL (ref 0.0–0.1)
Basophils Relative: 0 % (ref 0–1)
EOS PCT: 2 % (ref 0–5)
Eosinophils Absolute: 0.1 10*3/uL (ref 0.0–0.7)
HEMATOCRIT: 36.1 % (ref 36.0–46.0)
Hemoglobin: 11.4 g/dL — ABNORMAL LOW (ref 12.0–15.0)
LYMPHS ABS: 1.2 10*3/uL (ref 0.7–4.0)
LYMPHS PCT: 22 % (ref 12–46)
MCH: 26.1 pg (ref 26.0–34.0)
MCHC: 31.6 g/dL (ref 30.0–36.0)
MCV: 82.8 fL (ref 78.0–100.0)
MONO ABS: 0.5 10*3/uL (ref 0.1–1.0)
MPV: 10.2 fL (ref 8.6–12.4)
Monocytes Relative: 9 % (ref 3–12)
Neutro Abs: 3.6 10*3/uL (ref 1.7–7.7)
Neutrophils Relative %: 67 % (ref 43–77)
Platelets: 313 10*3/uL (ref 150–400)
RBC: 4.36 MIL/uL (ref 3.87–5.11)
RDW: 15.1 % (ref 11.5–15.5)
WBC: 5.4 10*3/uL (ref 4.0–10.5)

## 2015-03-05 MED ORDER — PEG 3350-KCL-NA BICARB-NACL 420 G PO SOLR: 4000.0000 mL | Freq: Once | ORAL | Status: DC

## 2015-03-05 NOTE — Assessment & Plan Note (Addendum)
Patient with a one-year history of chronic diarrhea which is worsened over the past 3-5 months. On days that she has diarrhea she will have a morning episode and then at least 2 further diarrhea stools later in the day. At this point she is having diarrhea throughout the day about every 2-3 days. On off days her stools are soft, no straining, no constipation. She also admits 50 pound weight loss over the past 3-5 months. Doubtful infectious etiology due to the intermittent nature of her symptoms. She does have a history of thyroid disease. Today I'll check TSH, CBC, CMP, tissue transglutaminase IgA, total IgA, and fecal elastase. Her last colonoscopy was 7 years ago and we'll proceed with a diagnostic colonoscopy to further evaluate her symptoms giving change in bowel habits and weight loss.  Proceed with colonoscopy in the OR with propofol/MAC with Dr. Darrick Penna in the near future. The risks, benefits, and alternatives have been discussed in detail with the patient. They state understanding and desire to proceed.   She is not on any anticoagulants and she is on antidepressant. Her last procedure was completed under propofol and we'll proceed with this procedure on propofol to promote adequate sedation.

## 2015-03-05 NOTE — Patient Instructions (Signed)
1. Have your labs drawn when you're able to. 2. We will schedule your procedure for you. 3. Further recommendations to be based on results of your procedure. 4. Return for follow-up in 4 weeks.

## 2015-03-05 NOTE — Assessment & Plan Note (Signed)
Patient with subjective weight loss as per history of present illness. Is also having intermittent diarrhea with increasing frequency over the past few months. We'll order labs and colonoscopy as noted above. Return for follow-up in 4 weeks.

## 2015-03-05 NOTE — Progress Notes (Signed)
cc'ed to pcp °

## 2015-03-05 NOTE — Progress Notes (Signed)
Primary Care Physician:  [name], MD Primary Gastroenterologist:  Dr. Darrick Penna  Chief Complaint  Patient presents with  . Diarrhea    HPI:   61 year old female presents on referral from primary care to establish care for chronic diarrhea. PCP notes reviewed. Last saw primary care 02/14/2015 at which point she mentioned intermittent episodes of diarrhea occurring for about the past year which she notices more after eating chocolate, lettuce, greasy hamburgers. No associated abdominal pain or hematochezia at that time. Last colonoscopy 2010. Previous colonoscopy records reviewed which was completed on 03/26/2008 and found normal colonoscopy through the terminal ileum. Recommended continued surveillance, high fiber diet with liberal fluids, outpatient follow-up as needed. Recommend repeat colonoscopy in 10 years.  Today she states for the past year she has had diarrhea at least 3 times a week, has been getting worse for the past 2 months. Recently had 2 loose stools 2 days ago. Seems to be worse with chocolate which she stopped eating, then lettuce, then greasy foods. When she has an episode of the diarrhea in the morning, she knows she'll have at least 2 more episodes later in the day. Has MS and is unable to get to the bathroom quickly and therefore has "accidents" regularly. Also with fecal urgency. Denies abdominal pain, N/V, fevers, chills. Admits subjective unintentional weight loss of 50 lb in the past 3-5 months. Appetite is "so-so" because "nothing really tastes good." Denies hematochezia, melena. When she's not having diarrhea she'll have 2 bowel movements daily which are "normal" and pass easy. Denies chest pain, dyspnea, dizziness, lightheadedness, syncope, near syncope. Denies any other upper or lower GI symptoms.    Past Medical History  Diagnosis Date  . Diabetes mellitus without complication (HCC)   . Hypertension   . Morbid obesity (HCC)   . High cholesterol   . MS  (multiple sclerosis) (HCC) relapsing remitting  . Severe obesity (BMI >= 40) (HCC) 10/11/2012    Past Surgical History  Procedure Laterality Date  . Cholecystectomy  8/09  . Left oophorectomy Left 8/11  . Colonoscopy      about 7 years    Current Outpatient Prescriptions  Medication Sig Dispense Refill  . aspirin 81 MG tablet Take 81 mg by mouth daily.    Marland Kitchen atorvastatin (LIPITOR) 40 MG tablet Take 40 mg by mouth daily.     . baclofen (LIORESAL) 10 MG tablet TAKE 1 TABLET THREE TIMES A DAY 270 tablet 1  . Cranberry (ELLURA PO) Take 1 tablet by mouth daily.    . Dulaglutide (TRULICITY) 1.5 MG/0.5ML SOPN Inject 1.5 mg into the skin once a week. 0.5 mL 3  . ergocalciferol (VITAMIN D2) 50000 UNITS capsule Take 50,000 Units by mouth once a week.    Marland Kitchen imipramine (TOFRANIL) 25 MG tablet TAKE 1 TABLET AT BEDTIME 90 tablet 1  . interferon beta-1b (BETASERON) 0.3 MG injection Inject 0.3 mg into the skin every other day.    Marland Kitchen LANTUS SOLOSTAR 100 UNIT/ML SOPN Inject 44 Units into the skin at bedtime.    Marland Kitchen levothyroxine (SYNTHROID, LEVOTHROID) 50 MCG tablet Take 50 mcg by mouth daily before breakfast.     . metFORMIN (GLUCOPHAGE-XR) 500 MG 24 hr tablet Take 500 mg by mouth 2 (two) times daily.     Marland Kitchen RELION PEN NEEDLES 32G X 4 MM MISC     . Tamsulosin HCl (FLOMAX) 0.4 MG CAPS Take 0.4 mg by mouth daily after breakfast.     . telmisartan (MICARDIS)  20 MG tablet Take 20 mg by mouth daily.     Marland Kitchen tolterodine (DETROL LA) 4 MG 24 hr capsule TAKE 1 CAPSULE DAILY 90 capsule 0  . TOPROL XL 25 MG 24 hr tablet Take 25 mg by mouth daily.      No current facility-administered medications for this visit.    Allergies as of 03/05/2015 - Review Complete 03/05/2015  Allergen Reaction Noted  . Penicillins  10/07/2011    Family History  Problem Relation Age of Onset  . Heart attack Mother   . Diabetes Mother   . Aneurysm Father   . Heart disease Father   . Cancer Maternal Grandmother     Social History     Social History  . Marital Status: Single    Spouse Name: N/A  . Number of Children: 0  . Years of Education: college   Occupational History  . disabled    Social History Main Topics  . Smoking status: Never Smoker   . Smokeless tobacco: Never Used  . Alcohol Use: No     Comment: quit in 1975  . Drug Use: No  . Sexual Activity: Not on file   Other Topics Concern  . Not on file   Social History Narrative   Patient is single and lives alone.   Patient is right-handed.   Patient has a college education.   Patient does not drink any caffeine.   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    Review of Systems: 10-point ROS negative except as per HPI.    Physical Exam: BP 99/72 mmHg  Pulse 97  Temp(Src) 97.9 F (36.6 C) (Oral)  Ht  (1.626 m)  Wt 226 lb 6.4 oz (102.694 kg)  BMI 38.84 kg/m2 General:   Morbidly obese female, alert and oriented. Pleasant and cooperative. Well-nourished and well-developed.  Head:  Normocephalic and atraumatic. Eyes:  Without icterus, sclera clear and conjunctiva pink.  Ears:  Normal auditory acuity. Cardiovascular:  S1, S2 present without murmurs appreciated. Extremities without clubbing or edema. Respiratory:  Clear to auscultation bilaterally. No wheezes, rales, or rhonchi. No distress.  Gastrointestinal:  +BS, obese but soft and non-distended. No HSM noted. No guarding or rebound. No masses appreciated.  Rectal:  Deferred  Musculoskalatal:  Hobbling gait, uses walker. Skin:  Intact without significant lesions or rashes. Neurologic:  Alert and oriented x4 Psych:  Alert and cooperative. Normal mood and affect. Heme/Lymph/Immune: No excessive bruising noted.    03/05/2015 11:00 AM

## 2015-03-06 ENCOUNTER — Other Ambulatory Visit: Payer: Self-pay | Admitting: Gastroenterology

## 2015-03-06 DIAGNOSIS — R197 Diarrhea, unspecified: Secondary | ICD-10-CM

## 2015-03-06 LAB — COMPREHENSIVE METABOLIC PANEL
ALBUMIN: 3.5 g/dL — AB (ref 3.6–5.1)
ALK PHOS: 73 U/L (ref 33–130)
ALT: 17 U/L (ref 6–29)
AST: 15 U/L (ref 10–35)
BILIRUBIN TOTAL: 0.3 mg/dL (ref 0.2–1.2)
BUN: 27 mg/dL — AB (ref 7–25)
CO2: 25 mmol/L (ref 20–31)
CREATININE: 1.07 mg/dL — AB (ref 0.50–1.05)
Calcium: 8.7 mg/dL (ref 8.6–10.4)
Chloride: 106 mmol/L (ref 98–110)
Glucose, Bld: 112 mg/dL — ABNORMAL HIGH (ref 65–99)
Potassium: 4.7 mmol/L (ref 3.5–5.3)
SODIUM: 141 mmol/L (ref 135–146)
TOTAL PROTEIN: 6.5 g/dL (ref 6.1–8.1)

## 2015-03-06 LAB — TSH: TSH: 1.13 mIU/L

## 2015-03-06 LAB — IGA: IGA: 267 mg/dL (ref 69–380)

## 2015-03-06 LAB — TISSUE TRANSGLUTAMINASE, IGA: Tissue Transglutaminase Ab, IgA: 31 U/mL — ABNORMAL HIGH (ref <4)

## 2015-03-06 NOTE — Progress Notes (Unsigned)
[facility] called and said patient presented with stool specimen and they needed new order placed for fecal elastase. Apparently they called here yesterday and was advised by someone to delete the stool but patient presented today with specimen. I don't see any documentation of that but per Eric's note, I ordered fecal elastase.

## 2015-03-07 ENCOUNTER — Other Ambulatory Visit: Payer: Self-pay

## 2015-03-07 DIAGNOSIS — R197 Diarrhea, unspecified: Secondary | ICD-10-CM | POA: Diagnosis not present

## 2015-03-07 NOTE — Progress Notes (Signed)
Unsure of why fecal elastase test was cancelled, appreciate re-entering the order.

## 2015-03-07 NOTE — Progress Notes (Signed)
Lab called here, spoke with Tyler Aas, she did not tell them to cancel the order for the fecal elastase.  The orders for it printed on the same sheet as her blood work orders.  A copy of it was made to put with the stool container and given to the pt. The lab was told this but was not told to cancel the order.

## 2015-03-13 NOTE — Patient Instructions (Signed)
[name]  03/13/2015     @   Your procedure is scheduled on  03/19/2015   Report to [facility] at  945  A.M.  Call this number if you have problems the morning of surgery:  [phone]   Remember:  Do not eat food or drink liquids after midnight.  Take these medicines the morning of surgery with A SIP OF WATER  Synthroid, flomax, micardia, detral, toprol XL. Take 1/2 your usual night time dose of insulin. DO NOT take any diabetic medicine the day of your procedure.   Do not wear jewelry, make-up or nail polish.  Do not wear lotions, powders, or perfumes.  You may wear deodorant.  Do not shave 48 hours prior to surgery.  Men may shave face and neck.  Do not bring valuables to the hospital.  [facility] is not responsible for any belongings or valuables.  Contacts, dentures or bridgework may not be worn into surgery.  Leave your suitcase in the car.  After surgery it may be brought to your room.  For patients admitted to the hospital, discharge time will be determined by your treatment team.  Patients discharged the day of surgery will not be allowed to drive home.   Name and phone number of your driver:   family Special instructions:  Follow the diet and prep instructions given to you by Dr Evelina Dun office.  Please read over the following fact sheets that you were given. Coughing and Deep Breathing, Surgical Site Infection Prevention, Anesthesia Post-op Instructions and Care and Recovery After Surgery      Colonoscopy A colonoscopy is an exam to look at the entire large intestine (colon). This exam can help find problems such as tumors, polyps, inflammation, and areas of bleeding. The exam takes about 1 hour.  LET [facility] CARE PROVIDER KNOW ABOUT:   Any allergies you have.  All medicines you are taking, including vitamins, herbs, eye drops, creams, and over-the-counter medicines.  Previous problems you or members of your family have  had with the use of anesthetics.  Any blood disorders you have.  Previous surgeries you have had.  Medical conditions you have. RISKS AND COMPLICATIONS  Generally, this is a safe procedure. However, as with any procedure, complications can occur. Possible complications include:  Bleeding.  Tearing or rupture of the colon wall.  Reaction to medicines given during the exam.  Infection (rare). BEFORE THE PROCEDURE   Ask your health care provider about changing or stopping your regular medicines.  You may be prescribed an oral bowel prep. This involves drinking a large amount of medicated liquid, starting the day before your procedure. The liquid will cause you to have multiple loose stools until your stool is almost clear or light green. This cleans out your colon in preparation for the procedure.  Do not eat or drink anything else once you have started the bowel prep, unless your health care provider tells you it is safe to do so.  Arrange for someone to drive you home after the procedure. PROCEDURE   You will be given medicine to help you relax (sedative).  You will lie on your side with your knees bent.  A long, flexible tube with a light and camera on the end (colonoscope) will be inserted through the rectum and into the colon. The camera sends video back to a computer screen as it moves through the colon. The colonoscope also releases carbon  dioxide gas to inflate the colon. This helps your health care provider see the area better.  During the exam, your health care provider may take a small tissue sample (biopsy) to be examined under a microscope if any abnormalities are found.  The exam is finished when the entire colon has been viewed. AFTER THE PROCEDURE   Do not drive for 24 hours after the exam.  You may have a small amount of blood in your stool.  You may pass moderate amounts of gas and have mild abdominal cramping or bloating. This is caused by the gas used to  inflate your colon during the exam.  Ask when your test results will be ready and how you will get your results. Make sure you get your test results.   This information is not intended to replace advice given to you by your health care provider. Make sure you discuss any questions you have with your health care provider.   Document Released: 01/10/2000 Document Revised: 11/02/2012 Document Reviewed: 09/19/2012 Elsevier Interactive Patient Education 2016 [employer] Colonoscopy, Care After Refer to this sheet in the next few weeks. These instructions provide you with information on caring for yourself after your procedure. Your health care provider may also give you more specific instructions. Your treatment has been planned according to current medical practices, but problems sometimes occur. Call your health care provider if you have any problems or questions after your procedure. WHAT TO EXPECT AFTER THE PROCEDURE  After your procedure, it is typical to have the following:  A small amount of blood in your stool.  Moderate amounts of gas and mild abdominal cramping or bloating. HOME CARE INSTRUCTIONS  Do not drive, operate machinery, or sign important documents for 24 hours.  You may shower and resume your regular physical activities, but move at a slower pace for the first 24 hours.  Take frequent rest periods for the first 24 hours.  Walk around or put a warm pack on your abdomen to help reduce abdominal cramping and bloating.  Drink enough fluids to keep your urine clear or pale yellow.  You may resume your normal diet as instructed by your health care provider. Avoid heavy or fried foods that are hard to digest.  Avoid drinking alcohol for 24 hours or as instructed by your health care provider.  Only take over-the-counter or prescription medicines as directed by your health care provider.  If a tissue sample (biopsy) was taken during your procedure:  Do not take aspirin or  blood thinners for 7 days, or as instructed by your health care provider.  Do not drink alcohol for 7 days, or as instructed by your health care provider.  Eat soft foods for the first 24 hours. SEEK MEDICAL CARE IF: You have persistent spotting of blood in your stool 2-3 days after the procedure. SEEK IMMEDIATE MEDICAL CARE IF:  You have more than a small spotting of blood in your stool.  You pass large blood clots in your stool.  Your abdomen is swollen (distended).  You have nausea or vomiting.  You have a fever.  You have increasing abdominal pain that is not relieved with medicine.   This information is not intended to replace advice given to you by your health care provider. Make sure you discuss any questions you have with your health care provider.   Document Released: 08/27/2003 Document Revised: 11/02/2012 Document Reviewed: 09/19/2012 Elsevier Interactive Patient Education 2016 [employer] PATIENT INSTRUCTIONS POST-ANESTHESIA  IMMEDIATELY FOLLOWING SURGERY:  Do not drive or operate machinery for the first twenty four hours after surgery.  Do not make any important decisions for twenty four hours after surgery or while taking narcotic pain medications or sedatives.  If you develop intractable nausea and vomiting or a severe headache please notify your doctor immediately.  FOLLOW-UP:  Please make an appointment with your surgeon as instructed. You do not need to follow up with anesthesia unless specifically instructed to do so.  WOUND CARE INSTRUCTIONS (if applicable):  Keep a dry clean dressing on the anesthesia/puncture wound site if there is drainage.  Once the wound has quit draining you may leave it open to air.  Generally you should leave the bandage intact for twenty four hours unless there is drainage.  If the epidural site drains for more than 36-48 hours please call the anesthesia department.  QUESTIONS?:  Please feel free to call your physician or the hospital  operator if you have any questions, and they will be happy to assist you.

## 2015-03-14 ENCOUNTER — Other Ambulatory Visit: Payer: Self-pay

## 2015-03-14 ENCOUNTER — Encounter (HOSPITAL_COMMUNITY): Payer: Self-pay

## 2015-03-14 ENCOUNTER — Encounter (HOSPITAL_COMMUNITY)
Admission: RE | Admit: 2015-03-14 | Discharge: 2015-03-14 | Disposition: A | Discharge status: Home / Self Care | Payer: Medicare Other | Source: Ambulatory Visit | Attending: Gastroenterology | Admitting: Gastroenterology

## 2015-03-14 DIAGNOSIS — E7800 Pure hypercholesterolemia, unspecified: Secondary | ICD-10-CM | POA: Insufficient documentation

## 2015-03-14 DIAGNOSIS — Z9889 Other specified postprocedural states: Secondary | ICD-10-CM | POA: Insufficient documentation

## 2015-03-14 DIAGNOSIS — Z01810 Encounter for preprocedural cardiovascular examination: Secondary | ICD-10-CM | POA: Insufficient documentation

## 2015-03-14 DIAGNOSIS — E119 Type 2 diabetes mellitus without complications: Secondary | ICD-10-CM | POA: Insufficient documentation

## 2015-03-14 DIAGNOSIS — Z6838 Body mass index (BMI) 38.0-38.9, adult: Secondary | ICD-10-CM | POA: Diagnosis not present

## 2015-03-14 DIAGNOSIS — K529 Noninfective gastroenteritis and colitis, unspecified: Secondary | ICD-10-CM | POA: Diagnosis not present

## 2015-03-14 DIAGNOSIS — I10 Essential (primary) hypertension: Secondary | ICD-10-CM | POA: Insufficient documentation

## 2015-03-14 DIAGNOSIS — G35 Multiple sclerosis: Secondary | ICD-10-CM | POA: Diagnosis not present

## 2015-03-14 DIAGNOSIS — Z79899 Other long term (current) drug therapy: Secondary | ICD-10-CM | POA: Insufficient documentation

## 2015-03-14 HISTORY — DX: Hypothyroidism, unspecified: E03.9

## 2015-03-14 LAB — PANCREATIC ELASTASE, FECAL

## 2015-03-14 NOTE — Pre-Procedure Instructions (Signed)
Patient given information to sign up with my chart at home. 

## 2015-03-19 ENCOUNTER — Telehealth: Payer: Self-pay | Admitting: Gastroenterology

## 2015-03-19 ENCOUNTER — Ambulatory Visit (HOSPITAL_COMMUNITY): Payer: Medicare Other | Admitting: Anesthesiology

## 2015-03-19 ENCOUNTER — Encounter (HOSPITAL_COMMUNITY)
Admission: RE | Disposition: A | Discharge status: Home / Self Care | Payer: Self-pay | Source: Ambulatory Visit | Attending: Gastroenterology

## 2015-03-19 ENCOUNTER — Encounter: Payer: Self-pay | Admitting: General Practice

## 2015-03-19 ENCOUNTER — Ambulatory Visit (HOSPITAL_COMMUNITY)
Admission: RE | Admit: 2015-03-19 | Discharge: 2015-03-19 | Disposition: A | Discharge status: Home / Self Care | Payer: Medicare Other | Source: Ambulatory Visit | Attending: Gastroenterology | Admitting: Gastroenterology

## 2015-03-19 ENCOUNTER — Encounter (HOSPITAL_COMMUNITY): Payer: Self-pay | Admitting: *Deleted

## 2015-03-19 DIAGNOSIS — R197 Diarrhea, unspecified: Secondary | ICD-10-CM | POA: Diagnosis not present

## 2015-03-19 DIAGNOSIS — E039 Hypothyroidism, unspecified: Secondary | ICD-10-CM | POA: Insufficient documentation

## 2015-03-19 DIAGNOSIS — Z794 Long term (current) use of insulin: Secondary | ICD-10-CM | POA: Diagnosis not present

## 2015-03-19 DIAGNOSIS — K648 Other hemorrhoids: Secondary | ICD-10-CM | POA: Diagnosis not present

## 2015-03-19 DIAGNOSIS — Z7982 Long term (current) use of aspirin: Secondary | ICD-10-CM | POA: Diagnosis not present

## 2015-03-19 DIAGNOSIS — E7800 Pure hypercholesterolemia, unspecified: Secondary | ICD-10-CM | POA: Insufficient documentation

## 2015-03-19 DIAGNOSIS — G35 Multiple sclerosis: Secondary | ICD-10-CM | POA: Diagnosis not present

## 2015-03-19 DIAGNOSIS — Z6839 Body mass index (BMI) 39.0-39.9, adult: Secondary | ICD-10-CM | POA: Insufficient documentation

## 2015-03-19 DIAGNOSIS — Z79899 Other long term (current) drug therapy: Secondary | ICD-10-CM | POA: Insufficient documentation

## 2015-03-19 DIAGNOSIS — E119 Type 2 diabetes mellitus without complications: Secondary | ICD-10-CM | POA: Insufficient documentation

## 2015-03-19 DIAGNOSIS — I10 Essential (primary) hypertension: Secondary | ICD-10-CM | POA: Insufficient documentation

## 2015-03-19 DIAGNOSIS — R634 Abnormal weight loss: Secondary | ICD-10-CM | POA: Diagnosis not present

## 2015-03-19 HISTORY — PX: BIOPSY: SHX5522

## 2015-03-19 HISTORY — PX: COLONOSCOPY WITH PROPOFOL: SHX5780

## 2015-03-19 LAB — GLUCOSE, CAPILLARY
GLUCOSE-CAPILLARY: 84 mg/dL (ref 65–99)
Glucose-Capillary: 99 mg/dL (ref 65–99)

## 2015-03-19 SURGERY — COLONOSCOPY WITH PROPOFOL
Anesthesia: Monitor Anesthesia Care

## 2015-03-19 MED ORDER — FENTANYL CITRATE (PF) 100 MCG/2ML IJ SOLN
25.0000 ug | INTRAMUSCULAR | Status: AC
  Administered 2015-03-19 (×2): 25 ug via INTRAVENOUS

## 2015-03-19 MED ORDER — GLYCOPYRROLATE 0.2 MG/ML IJ SOLN
INTRAMUSCULAR | Status: AC
  Filled 2015-03-19: qty 1

## 2015-03-19 MED ORDER — LACTATED RINGERS IV SOLN
INTRAVENOUS | Status: DC
  Administered 2015-03-19: 10:00:00 via INTRAVENOUS

## 2015-03-19 MED ORDER — PROPOFOL 500 MG/50ML IV EMUL
INTRAVENOUS | Status: DC | PRN
  Administered 2015-03-19: 11:00:00 via INTRAVENOUS
  Administered 2015-03-19: 75 ug/kg/min via INTRAVENOUS

## 2015-03-19 MED ORDER — FENTANYL CITRATE (PF) 100 MCG/2ML IJ SOLN: 25.0000 ug | INTRAMUSCULAR | Status: DC | PRN

## 2015-03-19 MED ORDER — ONDANSETRON HCL 4 MG/2ML IJ SOLN
INTRAMUSCULAR | Status: AC
  Filled 2015-03-19: qty 2

## 2015-03-19 MED ORDER — GLYCOPYRROLATE 0.2 MG/ML IJ SOLN
0.2000 mg | Freq: Once | INTRAMUSCULAR | Status: AC
  Administered 2015-03-19: 0.2 mg via INTRAVENOUS

## 2015-03-19 MED ORDER — MIDAZOLAM HCL 5 MG/5ML IJ SOLN
INTRAMUSCULAR | Status: DC | PRN
  Administered 2015-03-19 (×2): 1 mg via INTRAVENOUS

## 2015-03-19 MED ORDER — ONDANSETRON HCL 4 MG/2ML IJ SOLN
4.0000 mg | Freq: Once | INTRAMUSCULAR | Status: AC
  Administered 2015-03-19: 4 mg via INTRAVENOUS

## 2015-03-19 MED ORDER — FENTANYL CITRATE (PF) 100 MCG/2ML IJ SOLN
INTRAMUSCULAR | Status: AC
  Filled 2015-03-19: qty 2

## 2015-03-19 MED ORDER — ONDANSETRON HCL 4 MG/2ML IJ SOLN: 4.0000 mg | Freq: Once | INTRAMUSCULAR | Status: DC | PRN

## 2015-03-19 MED ORDER — MIDAZOLAM HCL 2 MG/2ML IJ SOLN
INTRAMUSCULAR | Status: AC
  Filled 2015-03-19: qty 2

## 2015-03-19 MED ORDER — MIDAZOLAM HCL 2 MG/2ML IJ SOLN
1.0000 mg | INTRAMUSCULAR | Status: DC | PRN
  Administered 2015-03-19: 2 mg via INTRAVENOUS

## 2015-03-19 NOTE — Transfer of Care (Signed)
Immediate Anesthesia Transfer of Care Note  Patient: [name]  Procedure(s) Performed: Procedure(s) with comments: COLONOSCOPY WITH PROPOFOL (N/A) - 1115 - office LM for pt to come earlier BIOPSY - random colon biopsy  Patient Location: PACU  Anesthesia Type:MAC  Level of Consciousness: awake and patient cooperative  Airway & Oxygen Therapy: Patient Spontanous Breathing and Patient connected to face mask oxygen  Post-op Assessment: Report given to RN, Post -op Vital signs reviewed and stable and Patient moving all extremities  Post vital signs: Reviewed and stable  Last Vitals:  Filed Vitals:   03/19/15 1030 03/19/15 1035  BP: 110/66   Pulse:    Temp:    Resp: 25 15    Complications: No apparent anesthesia complications

## 2015-03-19 NOTE — Anesthesia Postprocedure Evaluation (Signed)
Anesthesia Post Note  Patient: [name]  Procedure(s) Performed: Procedure(s) (LRB): COLONOSCOPY WITH PROPOFOL (N/A) BIOPSY  Patient location during evaluation: PACU Anesthesia Type: MAC Level of consciousness: awake and alert and patient cooperative Pain management: pain level controlled Vital Signs Assessment: post-procedure vital signs reviewed and stable Respiratory status: respiratory function stable Cardiovascular status: blood pressure returned to baseline Postop Assessment: no signs of nausea or vomiting Anesthetic complications: no    Last Vitals:  Filed Vitals:   03/19/15 1030 03/19/15 1035  BP: 110/66   Pulse:    Temp:    Resp: 25 15    Last Pain: There were no vitals filed for this visit.               Anicia Leuthold J

## 2015-03-19 NOTE — H&P (Signed)
Primary Care Physician:  [name], MD Primary Gastroenterologist:  Dr. Darrick Penna  Pre-Procedure History & Physical: HPI:  [name] is a 60 y.o. female here for  DIARRHEA.  Past Medical History  Diagnosis Date  . Diabetes mellitus without complication (HCC)   . Hypertension   . Morbid obesity (HCC)   . High cholesterol   . MS (multiple sclerosis) (HCC) relapsing remitting  . Severe obesity (BMI >= 40) (HCC) 10/11/2012  . Hypothyroidism     Past Surgical History  Procedure Laterality Date  . Cholecystectomy  8/09  . Left oophorectomy Left 8/11  . Colonoscopy      about 7 years    Prior to Admission medications   Medication Sig Start Date End Date Taking? Authorizing Provider  aspirin 81 MG tablet Take 81 mg by mouth daily.   Yes Historical Provider, MD  atorvastatin (LIPITOR) 40 MG tablet Take 40 mg by mouth daily.  01/08/14  Yes Historical Provider, MD  baclofen (LIORESAL) 10 MG tablet TAKE 1 TABLET THREE TIMES A DAY 11/04/14  Yes Levert Feinstein, MD  Cranberry (ELLURA PO) Take 1 tablet by mouth daily.   Yes Historical Provider, MD  Dulaglutide (TRULICITY) 1.5 MG/0.5ML SOPN Inject 1.5 mg into the skin once a week. 12/26/14  Yes Roma Kayser, MD  ergocalciferol (VITAMIN D2) 50000 UNITS capsule Take 50,000 Units by mouth once a week. Takes on Thursdays.   Yes Historical Provider, MD  imipramine (TOFRANIL) 25 MG tablet TAKE 1 TABLET AT BEDTIME Patient taking differently: TAKE 1 TABLET BY MOUTH TWICE DAILY. 12/24/14  Yes Carmen Dohmeier, MD  interferon beta-1b (BETASERON) 0.3 MG injection Inject 0.3 mg into the skin every other day.   Yes Historical Provider, MD  LANTUS SOLOSTAR 100 UNIT/ML SOPN Inject 44 Units into the skin at bedtime. 07/14/12  Yes Historical Provider, MD  levothyroxine (SYNTHROID, LEVOTHROID) 50 MCG tablet Take 50 mcg by mouth daily before breakfast.  08/13/14  Yes Historical Provider, MD  metFORMIN (GLUCOPHAGE-XR) 500 MG 24 hr tablet Take 500 mg by  mouth 2 (two) times daily.  12/11/13  Yes Historical Provider, MD  polyethylene glycol-electrolytes (NULYTELY/GOLYTELY) 420 g solution Take 4,000 mLs by mouth once. 03/05/15  Yes Anice Paganini, NP  Tamsulosin HCl (FLOMAX) 0.4 MG CAPS Take 0.4 mg by mouth daily after breakfast.    Yes Historical Provider, MD  telmisartan (MICARDIS) 20 MG tablet Take 20 mg by mouth daily.    Yes Historical Provider, MD  tolterodine (DETROL LA) 4 MG 24 hr capsule TAKE 1 CAPSULE DAILY 02/08/15  Yes Carmen Dohmeier, MD  TOPROL XL 25 MG 24 hr tablet Take 25 mg by mouth daily.  08/05/14  Yes Historical Provider, MD  RELION PEN NEEDLES 32G X 4 MM MISC  01/09/13   Historical Provider, MD    Allergies as of 03/05/2015 - Review Complete 03/05/2015  Allergen Reaction Noted  . Penicillins  10/07/2011    Family History  Problem Relation Age of Onset  . Heart attack Mother   . Diabetes Mother   . Aneurysm Father   . Heart disease Father   . Cancer Maternal Grandmother   . Colon cancer Neg Hx     Social History   Social History  . Marital Status: Single    Spouse Name: N/A  . Number of Children: 0  . Years of Education: college   Occupational History  . disabled    Social History Main Topics  . Smoking status: Never Smoker   .  Smokeless tobacco: Never Used  . Alcohol Use: No     Comment: quit in 1975  . Drug Use: No  . Sexual Activity: No   Other Topics Concern  . Not on file   Social History Narrative   Patient is single and lives alone.   Patient is right-handed.   Patient has a college education.   Patient does not drink any caffeine.   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    Review of Systems: See HPI, otherwise negative ROS   Physical Exam: BP 112/68 mmHg  Pulse 81  Temp(Src) 97.8 F (36.6 C) (Oral)  Ht 5\' 4"  (1.626 m)  Wt 230 lb (104.327 kg)  BMI 39.46 kg/m2  SpO2 100% General:   Alert,  pleasant and cooperative in NAD Head:  Normocephalic and atraumatic. Neck:   Supple; Lungs:  Clear throughout to auscultation.    Heart:  Regular rate and rhythm. Abdomen:  Soft, nontender and nondistended. Normal bowel sounds, without guarding, and without rebound.   Neurologic:  Alert and  oriented x4;  grossly normal neurologically.  Impression/Plan:     Diarrhea  PLAN: TCS TODAY WITH BIOPSY

## 2015-03-19 NOTE — Telephone Encounter (Signed)
Pt had procedure done today and said that she missed a call from [place] regarding her labs. I told her that CM had sent her something in her Mychart and CM was gone for the day and SF nurse was not available. Pt said that she would call back tomorrow.

## 2015-03-19 NOTE — Progress Notes (Signed)
REVIEWED-NO ADDITIONAL RECOMMENDATIONS. 

## 2015-03-19 NOTE — Anesthesia Preprocedure Evaluation (Signed)
Anesthesia Evaluation  Patient identified by MRN, date of birth, ID band Patient awake    Reviewed: Allergy & Precautions, NPO status , Patient's Chart, lab work & pertinent test results  Airway Mallampati: I  TM Distance: >3 FB     Dental  (+) Teeth Intact   Pulmonary sleep apnea and Continuous Positive Airway Pressure Ventilation ,    breath sounds clear to auscultation       Cardiovascular hypertension, Pt. on medications  Rhythm:Regular Rate:Normal     Neuro/Psych  Neuromuscular disease (MS with bilat LE weakness )    GI/Hepatic   Endo/Other  diabetes, Type 2, Insulin DependentHypothyroidism Morbid obesity  Renal/GU      Musculoskeletal   Abdominal   Peds  Hematology   Anesthesia Other Findings   Reproductive/Obstetrics                             Anesthesia Physical Anesthesia Plan  ASA: III  Anesthesia Plan: MAC   Post-op Pain Management:    Induction: Intravenous  Airway Management Planned: Simple Face Mask  Additional Equipment:   Intra-op Plan:   Post-operative Plan:   Informed Consent: I have reviewed the patients History and Physical, chart, labs and discussed the procedure including the risks, benefits and alternatives for the proposed anesthesia with the patient or authorized representative who has indicated his/her understanding and acceptance.     Plan Discussed with:   Anesthesia Plan Comments:         Anesthesia Quick Evaluation

## 2015-03-19 NOTE — Discharge Instructions (Signed)
NO OBVIOUS SOURCE FOR YOUR INTERMITTENT DIARRHEA WAS IDENTIFIED. It is most likely because you do not have a gallbladder.You have SMALL internal hemorrhoids. The last part of your small intestines is normal. I biopsied your colon. You did not have any polyps.   DRINK WATER TO KEEP YOUR URINE LIGHT YELLOW.  FOLLOW A HIGH FIBER/low fat DIET. AVOID ITEMS THAT CAUSE BLOATING. SEE INFO BELOW.  CHEW ONE TUMS WITH MEALS UP TO THREE TIMES A  DAY TO PREVENT DIARRHEA.  TAKE A PROBIOTIC DAILY ([employer] [facility], [facility], OR [facility]).   YOUR BIOPSY RESULTS WILL BE AVAILABLE IN MY CHART FEB 24 AND MY OFFICE WILL CONTACT YOU IN 10-14 DAYS WITH YOUR RESULTS.   FOLLOW UP IN 3 MOS.    Next colonoscopy in 10 years. Colonoscopy Care After Read the instructions outlined below and refer to this sheet in the next week. These discharge instructions provide you with general information on caring for yourself after you leave the hospital. While your treatment has been planned according to the most current medical practices available, unavoidable complications occasionally occur. If you have any problems or questions after discharge, call DR. Lacoya Wilbanks, [phone].  ACTIVITY  You may resume your regular activity, but move at a slower pace for the next 24 hours.   Take frequent rest periods for the next 24 hours.   Walking will help get rid of the air and reduce the bloated feeling in your belly (abdomen).   No driving for 24 hours (because of the medicine (anesthesia) used during the test).   You may shower.   Do not sign any important legal documents or operate any machinery for 24 hours (because of the anesthesia used during the test).    NUTRITION  Drink plenty of fluids.   You may resume your normal diet as instructed by your doctor.   Begin with a light meal and progress to your normal diet. Heavy or fried foods are harder to digest and may make you feel sick to your stomach (nauseated).    Avoid alcoholic beverages for 24 hours or as instructed.    MEDICATIONS  You may resume your normal medications.   WHAT YOU CAN EXPECT TODAY  Some feelings of bloating in the abdomen.   Passage of more gas than usual.   Spotting of blood in your stool or on the toilet paper  .  IF YOU HAD POLYPS REMOVED DURING THE COLONOSCOPY:  Eat a soft diet IF YOU HAVE NAUSEA, BLOATING, ABDOMINAL PAIN, OR VOMITING.    FINDING OUT THE RESULTS OF YOUR TEST Not all test results are available during your visit. DR. Darrick Penna WILL CALL YOU WITHIN 14 DAYS OF YOUR PROCEDUE WITH YOUR RESULTS. Do not assume everything is normal if you have not heard from DR. Omar Orrego, CALL HER OFFICE AT [phone].  SEEK IMMEDIATE MEDICAL ATTENTION AND CALL THE OFFICE: [phone] IF:  You have more than a spotting of blood in your stool.   Your belly is swollen (abdominal distention).   You are nauseated or vomiting.   You have a temperature over 101F.   You have abdominal pain or discomfort that is severe or gets worse throughout the day.  Low-Fat Diet BREADS, CEREALS, PASTA, RICE, DRIED PEAS, AND BEANS These products are high in carbohydrates and most are low in fat. Therefore, they can be increased in the diet as substitutes for fatty foods. They too, however, contain calories and should not be eaten in excess. Cereals can be eaten  for snacks as well as for breakfast.   FRUITS AND VEGETABLES It is good to eat fruits and vegetables. Besides being sources of fiber, both are rich in vitamins and some minerals. They help you get the daily allowances of these nutrients. Fruits and vegetables can be used for snacks and desserts.  MEATS Limit lean meat, chicken, [place], and fish to no more than 6 ounces per day. Beef, Pork, and Lamb Use lean cuts of beef, pork, and lamb. Lean cuts include:  Extra-lean ground beef.  Arm roast.  Sirloin tip.  Center-cut ham.  Round steak.  Loin chops.  Rump roast.    Tenderloin.  Trim all fat off the outside of meats before cooking. It is not necessary to severely decrease the intake of red meat, but lean choices should be made. Lean meat is rich in protein and contains a highly absorbable form of iron. Premenopausal women, in particular, should avoid reducing lean red meat because this could increase the risk for low red blood cells (iron-deficiency anemia).  Chicken and [place] These are good sources of protein. The fat of poultry can be reduced by removing the skin and underlying fat layers before cooking. Chicken and [place] can be substituted for lean red meat in the diet. Poultry should not be fried or covered with high-fat sauces. Fish and Shellfish Fish is a good source of protein. Shellfish contain cholesterol, but they usually are low in saturated fatty acids. The preparation of fish is important. Like chicken and [place], they should not be fried or covered with high-fat sauces. EGGS Egg whites contain no fat or cholesterol. They can be eaten often. Try 1 to 2 egg whites instead of whole eggs in recipes or use egg substitutes that do not contain yolk. MILK AND DAIRY PRODUCTS Use skim or 1% milk instead of 2% or whole milk. Decrease whole milk, natural, and processed cheeses. Use nonfat or low-fat (2%) cottage cheese or low-fat cheeses made from vegetable oils. Choose nonfat or low-fat (1 to 2%) yogurt. Experiment with evaporated skim milk in recipes that call for heavy cream. Substitute low-fat yogurt or low-fat cottage cheese for sour cream in dips and salad dressings. Have at least 2 servings of low-fat dairy products, such as 2 glasses of skim (or 1%) milk each day to help get your daily calcium intake. FATS AND OILS Reduce the total intake of fats, especially saturated fat. Butterfat, lard, and beef fats are high in saturated fat and cholesterol. These should be avoided as much as possible. Vegetable fats do not contain cholesterol, but certain  vegetable fats, such as coconut oil, palm oil, and palm kernel oil are very high in saturated fats. These should be limited. These fats are often used in bakery goods, processed foods, popcorn, oils, and nondairy creamers. Vegetable shortenings and some peanut butters contain hydrogenated oils, which are also saturated fats. Read the labels on these foods and check for saturated vegetable oils. Unsaturated vegetable oils and fats do not raise blood cholesterol. However, they should be limited because they are fats and are high in calories. Total fat should still be limited to 30% of your daily caloric intake. Desirable liquid vegetable oils are corn oil, cottonseed oil, olive oil, canola oil, safflower oil, soybean oil, and sunflower oil. Peanut oil is not as good, but small amounts are acceptable. Buy a heart-healthy tub margarine that has no partially hydrogenated oils in the ingredients. Mayonnaise and salad dressings often are made from unsaturated fats, but they should  also be limited because of their high calorie and fat content. Seeds, nuts, peanut butter, olives, and avocados are high in fat, but the fat is mainly the unsaturated type. These foods should be limited mainly to avoid excess calories and fat. OTHER EATING TIPS Snacks  Most sweets should be limited as snacks. They tend to be rich in calories and fats, and their caloric content outweighs their nutritional value. Some good choices in snacks are graham crackers, melba toast, soda crackers, bagels (no egg), English muffins, fruits, and vegetables. These snacks are preferable to snack crackers, [place] fries, TORTILLA CHIPS, and POTATO chips. Popcorn should be air-popped or cooked in small amounts of liquid vegetable oil. Desserts Eat fruit, low-fat yogurt, and fruit ices instead of pastries, cake, and cookies. Sherbet, angel food cake, gelatin dessert, frozen low-fat yogurt, or other frozen products that do not contain saturated fat (pure fruit  juice bars, frozen ice pops) are also acceptable.  COOKING METHODS Choose those methods that use little or no fat. They include: Poaching.  Braising.  Steaming.  Grilling.  Baking.  Stir-frying.  Broiling.  Microwaving.  Foods can be cooked in a nonstick pan without added fat, or use a nonfat cooking spray in regular cookware. Limit fried foods and avoid frying in saturated fat. Add moisture to lean meats by using water, broth, cooking wines, and other nonfat or low-fat sauces along with the cooking methods mentioned above. Soups and stews should be chilled after cooking. The fat that forms on top after a few hours in the refrigerator should be skimmed off. When preparing meals, avoid using excess salt. Salt can contribute to raising blood pressure in some people.  EATING AWAY FROM HOME Order entres, potatoes, and vegetables without sauces or butter. When meat exceeds the size of a deck of cards (3 to 4 ounces), the rest can be taken home for another meal. Choose vegetable or fruit salads and ask for low-calorie salad dressings to be served on the side. Use dressings sparingly. Limit high-fat toppings, such as bacon, crumbled eggs, cheese, sunflower seeds, and olives. Ask for heart-healthy tub margarine instead of butter.   High-Fiber Diet A high-fiber diet changes your normal diet to include more whole grains, legumes, fruits, and vegetables. Changes in the diet involve replacing refined carbohydrates with unrefined foods. The calorie level of the diet is essentially unchanged. The Dietary Reference Intake (recommended amount) for adult males is 38 grams per day. For adult females, it is 25 grams per day. Pregnant and lactating women should consume 28 grams of fiber per day. Fiber is the intact part of a plant that is not broken down during digestion. Functional fiber is fiber that has been isolated from the plant to provide a beneficial effect in the body. PURPOSE  Increase stool bulk.    Ease and regulate bowel movements.   Lower cholesterol.  REDUCE RISK OF COLON CANCER  INDICATIONS THAT YOU NEED MORE FIBER  Constipation and hemorrhoids.   Uncomplicated diverticulosis (intestine condition) and irritable bowel syndrome.   Weight management.   As a protective measure against hardening of the arteries (atherosclerosis), diabetes, and cancer.   GUIDELINES FOR INCREASING FIBER IN THE DIET  Start adding fiber to the diet slowly. A gradual increase of about 5 more grams (2 slices of whole-wheat bread, 2 servings of most fruits or vegetables, or 1 bowl of high-fiber cereal) per day is best. Too rapid an increase in fiber may result in constipation, flatulence, and bloating.   Drink  enough water and fluids to keep your urine clear or pale yellow. Water, juice, or caffeine-free drinks are recommended. Not drinking enough fluid may cause constipation.   Eat a variety of high-fiber foods rather than one type of fiber.   Try to increase your intake of fiber through using high-fiber foods rather than fiber pills or supplements that contain small amounts of fiber.   The goal is to change the types of food eaten. Do not supplement your present diet with high-fiber foods, but replace foods in your present diet.   INCLUDE A VARIETY OF FIBER SOURCES  Replace refined and processed grains with whole grains, canned fruits with fresh fruits, and incorporate other fiber sources. White rice, white breads, and most bakery goods contain little or no fiber.   Brown whole-grain rice, buckwheat oats, and many fruits and vegetables are all good sources of fiber. These include: broccoli, Brussels sprouts, cabbage, cauliflower, beets, sweet potatoes, white potatoes (skin on), carrots, tomatoes, eggplant, squash, berries, fresh fruits, and dried fruits.   Cereals appear to be the richest source of fiber. Cereal fiber is found in whole grains and bran. Bran is the fiber-rich outer coat of cereal  grain, which is largely removed in refining. In whole-grain cereals, the bran remains. In breakfast cereals, the largest amount of fiber is found in those with "bran" in their names. The fiber content is sometimes indicated on the label.   You may need to include additional fruits and vegetables each day.   In baking, for 1 cup white flour, you may use the following substitutions:   1 cup whole-wheat flour minus 2 tablespoons.   1/2 cup white flour plus 1/2 cup whole-wheat flour.    Hemorrhoids Hemorrhoids are dilated (enlarged) veins around the rectum. Sometimes clots will form in the veins. This makes them swollen and painful. These are called thrombosed hemorrhoids. Causes of hemorrhoids include:  Constipation.   Straining to have a bowel movement.   HEAVY LIFTING  HOME CARE INSTRUCTIONS  Eat a well balanced diet and drink 6 to 8 glasses of water every day to avoid constipation. You may also use a bulk laxative.   Avoid straining to have bowel movements.   Keep anal area dry and clean.   Do not use a donut shaped pillow or sit on the toilet for long periods. This increases blood pooling and pain.   Move your bowels when your body has the urge; this will require less straining and will decrease pain and pressure.

## 2015-03-19 NOTE — Progress Notes (Signed)
Quick Note:  I tried to call the patient, no answer, lmom also placed a gluten free diet in the mail to the patient ______

## 2015-03-19 NOTE — Op Note (Signed)
[facility] [address] [place], [address]   COLONOSCOPY PROCEDURE REPORT  PATIENT: [name], [name]  MR#: [id number] BIRTHDATE: Apr 10, 1955 , 59  yrs. old GENDER: female ENDOSCOPIST: West Bali, MD REFERRED HU:TMLYY Kevan Ny, M.D. PROCEDURE DATE:  04-05-15 PROCEDURE:   Colonoscopy with biopsy INDICATIONS:unexplained diarrhea. MEDICATIONS: Monitored anesthesia care  DESCRIPTION OF PROCEDURE:    Physical exam was performed.  Informed consent was obtained from the patient after explaining the benefits, risks, and alternatives to procedure.  The patient was connected to monitor and placed in left lateral position. Continuous oxygen was provided by nasal cannula and IV medicine administered through an indwelling cannula.  After administration of sedation and rectal exam, the patients rectum was intubated and the EC-[id number] ([id number])  colonoscope was advanced under direct visualization to the ileum.  The scope was removed slowly by carefully examining the color, texture, anatomy, and integrity mucosa on the way out.  The patient was recovered in endoscopy and discharged home in satisfactory condition. Estimated blood loss is zero unless otherwise noted in this procedure report.    COLON FINDINGS: The examined terminal ileum appeared to be normal. , The colonic mucosa appeared normal throughout the entire examined colon.  Multiple biopsies were performed using cold forceps.  , and Small internal hemorrhoids were found.  PREP QUALITY: excellent. CECAL W/D TIME: 15       minutes COMPLICATIONS: None  ENDOSCOPIC IMPRESSION: 1.   NORMAL ILEUM AND COLON 2.   Small internal hemorrhoids  RECOMMENDATIONS: DRINK WATER TO KEEP YOUR URINE LIGHT YELLOW. FOLLOW A HIGH FIBER/low fat DIET. CHEW ONE TUMS WITH MEALS UP TO TID TO PREVENT DIARRHEA. TAKE A PROBIOTIC DAILY. AWAIT BIOPSY RESULTS. FOLLOW UP IN 3 MOS. Next colonoscopy in 10  years.    _______________________________ eSignedWest Bali, MD 04/05/2015 4:13 PM   CPT CODES: ICD CODES:  The ICD and CPT codes recommended by this software are interpretations from the data that the clinical staff has captured with the software.  The verification of the translation of this report to the ICD and CPT codes and modifiers is the sole responsibility of the health care institution and practicing physician where this report was generated.  [employer] will not be held responsible for the validity of the ICD and CPT codes included on this report.  AMA assumes no liability for data contained or not contained herein. CPT is a Publishing rights manager of the [employer].

## 2015-03-20 ENCOUNTER — Ambulatory Visit: Payer: Medicare Other | Admitting: Urology

## 2015-03-21 ENCOUNTER — Encounter (HOSPITAL_COMMUNITY): Payer: Self-pay | Admitting: Gastroenterology

## 2015-03-21 NOTE — Telephone Encounter (Signed)
Noted  

## 2015-04-01 DIAGNOSIS — G35 Multiple sclerosis: Secondary | ICD-10-CM | POA: Diagnosis not present

## 2015-04-01 DIAGNOSIS — E119 Type 2 diabetes mellitus without complications: Secondary | ICD-10-CM | POA: Diagnosis not present

## 2015-04-01 DIAGNOSIS — R32 Unspecified urinary incontinence: Secondary | ICD-10-CM | POA: Diagnosis not present

## 2015-04-01 DIAGNOSIS — E7800 Pure hypercholesterolemia, unspecified: Secondary | ICD-10-CM | POA: Diagnosis not present

## 2015-04-02 ENCOUNTER — Ambulatory Visit (INDEPENDENT_AMBULATORY_CARE_PROVIDER_SITE_OTHER): Payer: Medicare Other | Admitting: Nurse Practitioner

## 2015-04-02 ENCOUNTER — Encounter: Payer: Self-pay | Admitting: Nurse Practitioner

## 2015-04-02 VITALS — BP 86/60 | HR 104 | Temp 97.9°F | Ht 64.0 in | Wt 230.0 lb

## 2015-04-02 DIAGNOSIS — R634 Abnormal weight loss: Secondary | ICD-10-CM

## 2015-04-02 DIAGNOSIS — R197 Diarrhea, unspecified: Secondary | ICD-10-CM | POA: Diagnosis not present

## 2015-04-02 DIAGNOSIS — K900 Celiac disease: Secondary | ICD-10-CM | POA: Diagnosis not present

## 2015-04-02 NOTE — Progress Notes (Signed)
REVIEWED-NO ADDITIONAL RECOMMENDATIONS. 

## 2015-04-02 NOTE — Assessment & Plan Note (Signed)
Weight loss has resolved as diarrhea has resolved. 4 pound weight gain in the past month. Recommend continue celiac diet, return for follow-up in 6 months.

## 2015-04-02 NOTE — Assessment & Plan Note (Addendum)
Celiac disease based on serum testing. Has been following a gluten-free diet and "learning as I ago." Symptoms have improved significantly. Return for follow-up in 6 months. Continue gluten-free diet.

## 2015-04-02 NOTE — Assessment & Plan Note (Addendum)
Significantly improved, consistent formed stools. Had one recent episode of diarrhea related to dietary intake. Recommend continue celiac diet, Tums with meals, return for follow-up in 6 months or sooner if needed. Colon random biopsy benign colonic mucosa. Colonoscopy normal, repeat 10 years.

## 2015-04-02 NOTE — Progress Notes (Signed)
CC'ED TO PCP 

## 2015-04-02 NOTE — Patient Instructions (Signed)
1. Continue your celiac diet and Tums as you have been. 2. Call us with any questions. 3. Return for follow-up in 6 months, or sooner if needed for worsening or recurrent symptoms.

## 2015-04-02 NOTE — Progress Notes (Signed)
Referring Provider: Shaune Pollack, MD Primary Care Physician:  [name], MD Primary GI:  Dr. Darrick Penna  Chief Complaint  Patient presents with  . Diarrhea    HPI:   [name] is a 60 y.o. female who presents for follow-up on diarrhea and weight loss. She was last seen in our office 03/05/2015 for the same. At that time she reported worsening diarrhea at least 3 times a week, fecal urgency, and subjective unintentional weight loss of 50 pounds in the past 3-5 months. Labs ordered today include CBC, CMP, TSH, tissue transglutaminase IgA, total IgA, and fecal elastase. Her labs looked stable/good, tissue transglutaminase IgA suggestive of celiac disease. Recommend she avoid gluten to see if that helps her symptoms. She was also referred for colonoscopy which was completed on 03/19/2015 and found normal ileum and colon, small internal hemorrhoids. Recommend high-fiber/low-fat diet, chew 1 Tums with meals to 3 times a day to prevent diarrhea, daily probiotic, follow-up in 3 months. Random colon biopsies found benign colonic mucosa.  Today she states she's doing well on the dysphagia diet and TUMS with meals. Had an episode of diarrhea a few days ago when she ate something she wasn't supposed to. Otherwise has had consistent formed stools "for the first time in months." Denies further subjective weight loss, fever, chills, hematochezia, melena. Objectively has put on 4 pounds since her last visit. Denies chest pain, dyspnea, dizziness, lightheadedness, syncope, near syncope. Denies any other upper or lower GI symptoms.  Past Medical History  Diagnosis Date  . Diabetes mellitus without complication (HCC)   . Hypertension   . Morbid obesity (HCC)   . High cholesterol   . MS (multiple sclerosis) (HCC) relapsing remitting  . Severe obesity (BMI >= 40) (HCC) 10/11/2012  . Hypothyroidism     Past Surgical History  Procedure Laterality Date  . Cholecystectomy  8/09  . Left oophorectomy  Left 8/11  . Colonoscopy      about 7 years  . Colonoscopy with propofol N/A 03/19/2015    TIW:[id number] ileum and colon/small internal hemorrhoids  . Biopsy  03/19/2015    Procedure: BIOPSY;  Surgeon: West Bali, MD;  Location: [facility];  Service: Endoscopy;;  random colon biopsy    Current Outpatient Prescriptions  Medication Sig Dispense Refill  . aspirin 81 MG tablet Take 81 mg by mouth daily.    Marland Kitchen atorvastatin (LIPITOR) 40 MG tablet Take 40 mg by mouth daily.     . baclofen (LIORESAL) 10 MG tablet TAKE 1 TABLET THREE TIMES A DAY 270 tablet 1  . Cranberry (ELLURA PO) Take 1 tablet by mouth daily.    . Dulaglutide (TRULICITY) 1.5 MG/0.5ML SOPN Inject 1.5 mg into the skin once a week. 0.5 mL 3  . ergocalciferol (VITAMIN D2) 50000 UNITS capsule Take 50,000 Units by mouth once a week. Takes on Thursdays.    Marland Kitchen imipramine (TOFRANIL) 25 MG tablet TAKE 1 TABLET AT BEDTIME (Patient taking differently: TAKE 1 TABLET BY MOUTH TWICE DAILY.) 90 tablet 1  . interferon beta-1b (BETASERON) 0.3 MG injection Inject 0.3 mg into the skin every other day.    Marland Kitchen LANTUS SOLOSTAR 100 UNIT/ML SOPN Inject 44 Units into the skin at bedtime.    Marland Kitchen levothyroxine (SYNTHROID, LEVOTHROID) 50 MCG tablet Take 50 mcg by mouth daily before breakfast.     . metFORMIN (GLUCOPHAGE-XR) 500 MG 24 hr tablet Take 500 mg by mouth 2 (two) times daily.     Marland Kitchen RELION  PEN NEEDLES 32G X 4 MM MISC     . Tamsulosin HCl (FLOMAX) 0.4 MG CAPS Take 0.4 mg by mouth daily after breakfast.     . telmisartan (MICARDIS) 20 MG tablet Take 20 mg by mouth daily.     Marland Kitchen tolterodine (DETROL LA) 4 MG 24 hr capsule TAKE 1 CAPSULE DAILY 90 capsule 0  . TOPROL XL 25 MG 24 hr tablet Take 25 mg by mouth daily.      No current facility-administered medications for this visit.    Allergies as of 04/02/2015 - Review Complete 04/02/2015  Allergen Reaction Noted  . Penicillins Anaphylaxis 10/07/2011  . Lotensin [benazepril hcl] Cough 03/11/2015     Family History  Problem Relation Age of Onset  . Heart attack Mother   . Diabetes Mother   . Aneurysm Father   . Heart disease Father   . Cancer Maternal Grandmother   . Colon cancer Neg Hx     Social History   Social History  . Marital Status: Single    Spouse Name: N/A  . Number of Children: 0  . Years of Education: college   Occupational History  . disabled    Social History Main Topics  . Smoking status: Never Smoker   . Smokeless tobacco: Never Used  . Alcohol Use: No     Comment: quit in 1975  . Drug Use: No  . Sexual Activity: No   Other Topics Concern  . None   Social History Narrative   Patient is single and lives alone.   Patient is right-handed.   Patient has a college education.   Patient does not drink any caffeine.   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    Review of Systems: 10-point ROS negative except as per HPI.   Physical Exam: BP 86/60 mmHg  Pulse 104  Temp(Src) 97.9 F (36.6 C) (Oral)  Ht  (1.626 m)  Wt 230 lb (104.327 kg)  BMI 39.46 kg/m2 General:   Alert and oriented. Pleasant and cooperative. Well-nourished and well-developed.  Head:  Normocephalic and atraumatic. Cardiovascular:  S1, S2 present without murmurs appreciated. Extremities without clubbing or edema. Respiratory:  Clear to auscultation bilaterally. No wheezes, rales, or rhonchi. No distress.  Gastrointestinal:  +BS, soft, non-tender and non-distended. No HSM noted. No guarding or rebound. No masses appreciated.  Rectal:  Deferred  Skin:  Intact without significant lesions or rashes. Psych:  Alert and cooperative. Normal mood and affect. Heme/Lymph/Immune: No excessive bruising noted.    04/02/2015 8:49 AM   Disclaimer: This note was dictated with voice recognition software. Similar sounding words can inadvertently be transcribed and may not be corrected upon review.

## 2015-04-03 ENCOUNTER — Other Ambulatory Visit: Payer: Self-pay | Admitting: Urology

## 2015-04-03 ENCOUNTER — Ambulatory Visit (INDEPENDENT_AMBULATORY_CARE_PROVIDER_SITE_OTHER): Payer: Medicare Other | Admitting: Urology

## 2015-04-03 DIAGNOSIS — N3000 Acute cystitis without hematuria: Secondary | ICD-10-CM

## 2015-04-03 DIAGNOSIS — N3020 Other chronic cystitis without hematuria: Secondary | ICD-10-CM

## 2015-04-04 ENCOUNTER — Ambulatory Visit (HOSPITAL_COMMUNITY)
Admission: RE | Admit: 2015-04-04 | Discharge: 2015-04-04 | Disposition: A | Discharge status: Home / Self Care | Payer: Medicare Other | Source: Ambulatory Visit | Attending: Urology | Admitting: Urology

## 2015-04-04 DIAGNOSIS — N3020 Other chronic cystitis without hematuria: Secondary | ICD-10-CM | POA: Diagnosis not present

## 2015-04-04 DIAGNOSIS — I10 Essential (primary) hypertension: Secondary | ICD-10-CM | POA: Diagnosis not present

## 2015-04-23 ENCOUNTER — Other Ambulatory Visit: Payer: Self-pay | Admitting: "Endocrinology

## 2015-05-20 ENCOUNTER — Other Ambulatory Visit: Payer: Self-pay | Admitting: Neurology

## 2015-05-22 ENCOUNTER — Other Ambulatory Visit: Payer: Self-pay

## 2015-05-22 MED ORDER — BACLOFEN 10 MG PO TABS: 10.0000 mg | ORAL_TABLET | Freq: Three times a day (TID) | ORAL | Status: DC

## 2015-05-31 ENCOUNTER — Other Ambulatory Visit: Payer: Self-pay | Admitting: Neurology

## 2015-06-04 ENCOUNTER — Telehealth: Payer: Self-pay | Admitting: *Deleted

## 2015-06-04 DIAGNOSIS — G35 Multiple sclerosis: Secondary | ICD-10-CM

## 2015-06-04 NOTE — Telephone Encounter (Signed)
Pt  called, need a prescription for a lift chair. Please call [phone]

## 2015-06-04 NOTE — Telephone Encounter (Signed)
I spoke to pt. She needs a RX for a lift chair and she will pick the RX up at our office on Monday.

## 2015-06-10 ENCOUNTER — Telehealth: Payer: Self-pay | Admitting: Neurology

## 2015-06-10 NOTE — Telephone Encounter (Signed)
[name]  Physician:  DOHMIER  [phone] * DOB: March 01, 2055  CHECKING IF SHE HAS APPT ON MONDAY 5/22   I called pt back and left message to call back.

## 2015-06-17 ENCOUNTER — Encounter: Payer: Self-pay | Admitting: Neurology

## 2015-06-17 ENCOUNTER — Ambulatory Visit (INDEPENDENT_AMBULATORY_CARE_PROVIDER_SITE_OTHER): Payer: Medicare Other | Admitting: Neurology

## 2015-06-17 VITALS — BP 98/72 | HR 74 | Resp 20 | Ht 64.0 in | Wt 229.0 lb

## 2015-06-17 DIAGNOSIS — G35 Multiple sclerosis: Secondary | ICD-10-CM

## 2015-06-17 NOTE — Patient Instructions (Signed)

## 2015-06-17 NOTE — Progress Notes (Signed)
[facility]  Provider:  Melvyn Novas, M D  Referring Provider: Shaune Pollack, MD Primary Care Physician:  [name], MD  Chief Complaint  Patient presents with  . Follow-up    not on cpap any longer  . Multiple Sclerosis    denies relapses, has bad days and good day    HPI:  [name] is a 60 y.o. female  Is seen here as a revisit  from Dr. Kevan Ny for multiple sclerosis follow up.   The patient had followed [name] for over 20 years , diagnosed since 1989 ,, and is now mainly seen by [name], NP. She has never been on another medication, but Betaseron.    In my last visit , 2013 , I suggested Ampyra to her , which we than initiated but had to D/C after her kidney function changed.  The patient is diabetic, diagnosed in 2008. She just changed in 2014 to insulin.  She remains on Betaseron. Has been gaining weight and has had a fall last December. Uses a Rollator when she is outdoors but indoors a walking stick or cane, but more and more frequently the walker. .    She believes her sleep is poor due to spasms and nocturia, she may snore. Still wakes with a dry mouth, has been told she snores. Sleeps prone. The patient underwent a sleep study I believe in 2014, but was unable to tolerate the CPAP due to her sleeping habit.  She has a chief complaint today of cramping pain in the left leg, spasm, at the groin and right above the hip. No radiation , no burning and no deep ache - just spams.  Onset 3 weeks ago.  Although, she has taken baclofen po now tid  for a long time , for the " MS hug" - it had an effect only on abdominal spasms.  On the same dose, it has not affected the hip spasms.  She believes her sleep is poor due to spasms and nocturia, she may snore. Wakes with a dry mouth, has been told she snores. Sleeps prone.  The spasms in the left hip are preceded by sitting in certain low chairs in [employer] , her volunteer work place.    Dr.  Sherril Croon  recently did some routine Labs, that are not available on EPIC.    Review of Systems: Out of a complete 14 system review, the patient complains of only the following symptoms, and all other reviewed systems are negative.  Hip spasm, pain , non radiating , weight gain, fatigue,  Gait disorder, falls , urinary incontinence, nocturia.    Social History   Social History  . Marital Status: Single    Spouse Name: N/A  . Number of Children: 0  . Years of Education: college   Occupational History  . disabled    Social History Main Topics  . Smoking status: Never Smoker   . Smokeless tobacco: Never Used  . Alcohol Use: No     Comment: quit in 1975  . Drug Use: No  . Sexual Activity: No   Other Topics Concern  . Not on file   Social History Narrative   Patient is single and lives alone.   Patient is right-handed.   Patient has a college education.   Patient does not drink any caffeine.   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    Family History  Problem Relation Age of Onset  .  Heart attack Mother   . Diabetes Mother   . Aneurysm Father   . Heart disease Father   . Cancer Maternal Grandmother   . Colon cancer Neg Hx     Past Medical History  Diagnosis Date  . Diabetes mellitus without complication (HCC)   . Hypertension   . Morbid obesity (HCC)   . High cholesterol   . MS (multiple sclerosis) (HCC) relapsing remitting  . Severe obesity (BMI >= 40) (HCC) 10/11/2012  . Hypothyroidism     Past Surgical History  Procedure Laterality Date  . Cholecystectomy  8/09  . Left oophorectomy Left 8/11  . Colonoscopy      about 7 years  . Colonoscopy with propofol N/A 03/19/2015    [id number] ileum and colon/small internal hemorrhoids  . Biopsy  03/19/2015    Procedure: BIOPSY;  Surgeon: West Bali, MD;  [facility];  Service: Endoscopy;;  random colon biopsy    Current Outpatient Prescriptions  Medication Sig Dispense Refill  . aspirin  81 MG tablet Take 81 mg by mouth daily.    Marland Kitchen atorvastatin (LIPITOR) 40 MG tablet Take 40 mg by mouth daily.     . baclofen (LIORESAL) 10 MG tablet Take 1 tablet (10 mg total) by mouth 3 (three) times daily. 270 tablet 1  . Cranberry (ELLURA PO) Take 1 tablet by mouth daily.    . ergocalciferol (VITAMIN D2) 50000 UNITS capsule Take 50,000 Units by mouth once a week. Takes on Thursdays.    Marland Kitchen imipramine (TOFRANIL) 25 MG tablet TAKE 1 TABLET AT BEDTIME 90 tablet 0  . interferon beta-1b (BETASERON) 0.3 MG injection Inject 0.3 mg into the skin every other day.    Marland Kitchen LANTUS SOLOSTAR 100 UNIT/ML SOPN Inject 44 Units into the skin at bedtime.    Marland Kitchen levothyroxine (SYNTHROID, LEVOTHROID) 50 MCG tablet Take 50 mcg by mouth daily before breakfast.     . metFORMIN (GLUCOPHAGE-XR) 500 MG 24 hr tablet Take 500 mg by mouth 2 (two) times daily.     Marland Kitchen RELION PEN NEEDLES 32G X 4 MM MISC     . Tamsulosin HCl (FLOMAX) 0.4 MG CAPS Take 0.4 mg by mouth daily after breakfast.     . telmisartan (MICARDIS) 20 MG tablet Take 20 mg by mouth daily.     Marland Kitchen tolterodine (DETROL LA) 4 MG 24 hr capsule TAKE 1 CAPSULE DAILY 90 capsule 0  . TOPROL XL 25 MG 24 hr tablet Take 25 mg by mouth daily.     . TRULICITY 1.5 MG/0.5ML SOPN INJECT 1.5 MG INTO THE SKIN ONCE A WEEK 2 mL 2   No current facility-administered medications for this visit.    Allergies as of 06/17/2015 - Review Complete 06/17/2015  Allergen Reaction Noted  . Penicillins Anaphylaxis 10/07/2011  . Lotensin [benazepril hcl] Cough 03/11/2015   patient lost 50 pounds since 2016 .  Vitals: BP 98/72 mmHg  Pulse 74  Resp 20  Ht 5\' 4"  (1.626 m)  Wt 229 lb (103.874 kg)  BMI 39.29 kg/m2 Last Weight:  Wt Readings from Last 1 Encounters:  06/17/15 229 lb (103.874 kg)   Last Height:   Ht Readings from Last 1 Encounters:  06/17/15 5\' 4"  (1.626 m)    Physical exam:  General: The patient is awake, alert and appears not in acute distress. The patient is well  groomed. Head: Normocephalic, atraumatic.  Neck is supple. Mallampati 3 , neck circumference: 15 inches , no retrognathia, no retainers,  no TMJ, no nasal deviation  Cardiovascular:  Regular rate and rhythm, without  murmurs or carotid bruit, and without distended neck veins. Respiratory: Lungs are clear to auscultation. Skin:  Without evidence of edema, or rash Trunk: BMI is elevated .  Neurologic exam : The patient is awake and alert, oriented to place and time.   Memory subjective described as impaired- slower , some word finding .  There is a normal attention span & concentration ability.  Speech is fluent without  dysarthria, dysphonia- Mood and affect are appropriate.  Cranial nerves:  Smell and taste is preserved. Pupils are equal and briskly reactive to light. Funduscopic exam without  evidence of pallor or edema. No history of optic neuritis. Extraocular movements  in vertical and horizontal planes intact and without nystagmus. Visual fields by finger perimetry are intact. Hearing to finger rub intact.  Facial sensation intact to fine touch. Facial motor strength is symmetric and tongue and uvula move midline. Motor exam: Normal tone and normal muscle bulk and symmetric normal strength in all extremities. Sensory:  Fine touch, pinprick and vibration were tested in all extremities. Proprioception in upper extremities is normal. Coordination: Rapid alternating movements in the fingers/hands is tested and normal. Finger-to-nose maneuver tested and normal without evidence of ataxia, dysmetria or tremor. The patient has trouble lifting the right foot and adducting/ flexing the right hip. She places her left leg first on the stool before she tries to sit up at the exam table, She is unable to turn, unable to rise without bracing herself,  Patient walks with assistive device.  Deep tendon reflexes: in the  upper and lower extremities are symmetric, brisk  and intact.  Babinski maneuver  response is up going right, and down going left .   06-17-2015 35 minute re-Assessment with ore than 50% of the time dedicated to face to face discussion and coordination of care. :  MS relapsing - remitting , but without relapse over 10 years . May enter the  progressive phase of the disease.  She will remain on Betaseron, not oral medication. 20 years .  She was not doing well on AMPYRA, her renal function declined..  Fatigue due to MS or sleepiness from nocturia and apnea?  Sleep study revealed mild AHI, tried CPAP but couldn't tolerate the sleep position. She sleeps usually prone/  Baclofen now 10 mg tid. Spasm are most bother some at night. She has refills.   PT re - evaluation , fall prevention to be re- discussed.   She uses a walker  .   Her urologist at [facility] - she sees Dr. Patsi Sears.

## 2015-06-18 ENCOUNTER — Telehealth: Payer: Self-pay

## 2015-06-18 LAB — COMPREHENSIVE METABOLIC PANEL
A/G RATIO: 1.3 (ref 1.2–2.2)
ALK PHOS: 104 IU/L (ref 39–117)
ALT: 26 IU/L (ref 0–32)
AST: 16 IU/L (ref 0–40)
Albumin: 4 g/dL (ref 3.5–5.5)
BILIRUBIN TOTAL: 0.3 mg/dL (ref 0.0–1.2)
BUN/Creatinine Ratio: 26 — ABNORMAL HIGH (ref 9–23)
BUN: 26 mg/dL — AB (ref 6–24)
CHLORIDE: 103 mmol/L (ref 96–106)
CO2: 20 mmol/L (ref 18–29)
Calcium: 9.5 mg/dL (ref 8.7–10.2)
Creatinine, Ser: 0.99 mg/dL (ref 0.57–1.00)
GFR calc Af Amer: 73 mL/min/{1.73_m2} (ref >59)
GFR calc non Af Amer: 63 mL/min/{1.73_m2} (ref >59)
GLUCOSE: 144 mg/dL — AB (ref 65–99)
Globulin, Total: 3 g/dL (ref 1.5–4.5)
POTASSIUM: 5.7 mmol/L — AB (ref 3.5–5.2)
Sodium: 143 mmol/L (ref 134–144)
Total Protein: 7 g/dL (ref 6.0–8.5)

## 2015-06-18 NOTE — Telephone Encounter (Signed)
This encounter was created in error - please disregard.

## 2015-06-18 NOTE — Telephone Encounter (Signed)
I called pt to discuss lab results. No answer, left a message asking her to call me back. 

## 2015-06-18 NOTE — Telephone Encounter (Signed)
-----   Message from Melvyn Novas, MD sent at 06/18/2015 12:09 PM EDT ----- High potassium and high BUN- was patient dehydrated ? No abnor modalities of liver function. If she carries a diagnosis of impaired renal function, her MRI should be performed without contrast. If she does not have an impaired renal function she should hydrate-  drink a lot of fluid before and after the test

## 2015-06-25 NOTE — Telephone Encounter (Signed)
I called pt again to discuss labs. No answer, left a message asking her to call me back. 

## 2015-06-25 NOTE — Telephone Encounter (Signed)
Patient called back about her lab results.  Please call. Thanks!

## 2015-06-26 NOTE — Telephone Encounter (Signed)
[name], I want just to be sure that she is hydrated well before and after contrast is given and probably for the next 2 days after the study. Of course we should share all the reports with her primary care physician, Dr. Sherril Croon in [place].  Can you make sure the labs are reported to him, too?

## 2015-06-26 NOTE — Telephone Encounter (Signed)
I spoke to pt. I advised her that Dr. Vickey Huger recommends that she stay well hydrated befor and after contrast is given and fo the next two days after her MRI. Pt verbalized understanding. I have already faxed these labs to Dr. Sherril Croon in [place]. Will route the MRI report to him as well when it is resulted.

## 2015-06-26 NOTE — Telephone Encounter (Signed)
Spoke to pt. I advised her that Dr. Vickey Huger reviewed her labs and wanted pt to know that her potassium and BUN was elevated, and was the pt dehydrated? Pt's liver function was normal. Pt says she doesn't know of any kidney abnormalities. She will drink lots of fluid before and after her MRI with contrast. She wants Dr. Sherril Croon to be sent a copy of these results.  Dr. Vickey Huger, is there anything else you want me to do regarding these results? Pt is still going to have MRI with contrast but knows to drink fluid before and after.

## 2015-06-27 ENCOUNTER — Telehealth: Payer: Self-pay

## 2015-06-27 NOTE — Telephone Encounter (Signed)
I spoke to pt and advised her that per Dr. Vickey Huger, pt's JCV virus was negative. Pt verbalized understanding of results. Pt had no questions at this time but was encouraged to call back if questions arise.

## 2015-07-09 ENCOUNTER — Telehealth (HOSPITAL_COMMUNITY): Payer: Self-pay | Admitting: Physical Therapy

## 2015-07-09 ENCOUNTER — Ambulatory Visit (HOSPITAL_COMMUNITY): Payer: Medicare Other | Attending: Neurology | Admitting: Physical Therapy

## 2015-07-09 DIAGNOSIS — R262 Difficulty in walking, not elsewhere classified: Secondary | ICD-10-CM | POA: Insufficient documentation

## 2015-07-09 DIAGNOSIS — Z9181 History of falling: Secondary | ICD-10-CM | POA: Insufficient documentation

## 2015-07-09 DIAGNOSIS — R2681 Unsteadiness on feet: Secondary | ICD-10-CM | POA: Insufficient documentation

## 2015-07-09 DIAGNOSIS — M6281 Muscle weakness (generalized): Secondary | ICD-10-CM | POA: Insufficient documentation

## 2015-07-09 NOTE — Telephone Encounter (Signed)
Notified pt of missed apt today at 11:15am. Left # to clinic and encouraged her to call if she would like to reschedule another evaluation or if she is no longer interested in PT.   1:50 PM,07/09/2015 Marylyn Ishihara PT, DPT [facility] [phone]

## 2015-07-12 ENCOUNTER — Ambulatory Visit (HOSPITAL_COMMUNITY): Payer: Medicare Other | Admitting: Physical Therapy

## 2015-07-12 DIAGNOSIS — M6281 Muscle weakness (generalized): Secondary | ICD-10-CM

## 2015-07-12 DIAGNOSIS — R262 Difficulty in walking, not elsewhere classified: Secondary | ICD-10-CM | POA: Diagnosis not present

## 2015-07-12 DIAGNOSIS — Z9181 History of falling: Secondary | ICD-10-CM | POA: Diagnosis not present

## 2015-07-12 DIAGNOSIS — R2681 Unsteadiness on feet: Secondary | ICD-10-CM

## 2015-07-12 NOTE — Patient Instructions (Addendum)
Toe Raise (Sitting)    Raise toes, keeping heels on floor. Repeat __10__ times per set. Do __1__ sets per session. Do 6-8____ sessions per day.  http://orth.exer.us/46   Copyright  VHI. All rights reserved.  Heel Raise (Sitting)    Raise heels, keeping toes on floor. Repeat _10___ times per set. Do __1__ sets per session. Do _6-8___ sessions per day.  http://orth.exer.us/44   Copyright  VHI. All rights reserved.  Knee Extension (Sitting)    Place __0__ pound weight on left ankle and straighten knee fully, lower slowly. Repeat _10___ times per set. Do __1__ sets per session. Do __6-8__ sessions per day.  http://orth.exer.us/732   Copyright  VHI. All rights reserved.  Bridging    Slowly raise buttocks from floor, keeping stomach tight. Repeat __10__ times per set. Do ___1_ sets per session. Do _2___ sessions per day. 1 http://orth.exer.us/1096   Copyright  VHI. All rights reserved.  Bent Leg Lift (Hook-Lying)    Tighten stomach and slowly raise right leg _2__ inches from floor. Keep trunk rigid. Hold _10___ seconds. Repeat __10__ times per set. Do __1__ sets per session. Do __2__ sessions per day.  http://orth.exer.us/1090   Copyright  VHI. All rights reserved.  Hip Abduction / Adduction: with Knee Flexion (Supine)    With right knee bent, gently lower knee to side and return. Repeat ____ times per set. Do ____ sets per session. Do ____ sessions per day.  http://orth.exer.us/682   Copyright  VHI. All rights reserved.  Hip Abduction / Adduction: with Extended Knee (Supine)    Bring left leg out to side and return. Keep knee straight. Repeat _10__ times per set. Do ____ sets per session. Do __2__ sessions per day. 1 http://orth.exer.us/680   Copyright  VHI. All rights reserved.

## 2015-07-12 NOTE — Therapy (Signed)
[facility] [address] [place], [place], [address] Phone: [phone]   Fax:  [phone]  Physical Therapy Evaluation  Patient Details  Name: [name] MRN: [id number] Date of Birth: October 03, 1955 Referring Provider: Porfirio Mylar Dohmeier   Encounter Date: 07/12/2015      PT End of Session - 07/12/15 1026    Visit Number 1   Number of Visits 16   Date for PT Re-Evaluation 08/11/15   Authorization Type medicare    Authorization - Visit Number 1   Authorization - Number of Visits 10   PT Start Time [phone]   PT Stop Time 1030   PT Time Calculation (min) 34 min   Activity Tolerance Patient tolerated treatment well   Behavior During Therapy [facility] for tasks assessed/performed      Past Medical History  Diagnosis Date  . Diabetes mellitus without complication (HCC)   . Hypertension   . Morbid obesity (HCC)   . High cholesterol   . MS (multiple sclerosis) (HCC) relapsing remitting  . Severe obesity (BMI >= 40) (HCC) 10/11/2012  . Hypothyroidism     Past Surgical History  Procedure Laterality Date  . Cholecystectomy  8/09  . Left oophorectomy Left 8/11  . Colonoscopy      about 7 years  . Colonoscopy with propofol N/A 03/19/2015    [id number] ileum and colon/small internal hemorrhoids  . Biopsy  03/19/2015    Procedure: BIOPSY;  Surgeon: West Bali, MD;  [facility];  Service: Endoscopy;;  random colon biopsy    There were no vitals filed for this visit.       Subjective Assessment - 07/12/15 1003    Subjective Ms. Roznowski states that she has noticed over several weeks that her right leg is getting weaker to the point where she has to concentrate to walk.  She went to her MD who has referred her to physical therapy.    Pertinent History MS diagnosed in 1989, DM,    How long can you sit comfortably? no problem    How long can you stand comfortably? able to stand for less than one  minutes without UE support    How  long can you walk comfortably? with rollator able to walk for five minutes    Currently in Pain? No/denies            [facility] PT Assessment - 07/12/15 0001    Assessment   Medical Diagnosis Abnormal gait   Referring Provider Porfirio Mylar Dohmeier    Onset Date/Surgical Date 06/11/15  acute exacerbation    Next MD Visit 8/22 /2017   Prior Therapy not for this episode   Precautions   Precautions Fall   Restrictions   Weight Bearing Restrictions No   Balance Screen   Has the patient fallen in the past 6 months Yes   How many times? 5   Has the patient had a decrease in activity level because of a fear of falling?  Yes   Is the patient reluctant to leave their home because of a fear of falling?  No   Home [employer]ist information centre manager residence   Prior Function   Level of Independence Independent with community mobility with device   Vocation On disability   Leisure volunteers at [employer]   Overall Cognitive Status Within Functional Limits for tasks assessed   Observation/Other Assessments   Observations Pt has significant difficulty rolling supine to  Lt or Rt sidelying postition.    Functional Tests   Functional tests Single leg stance;Sit to Stand   Single Leg Stance   Comments unable to on either leg    Sit to Stand   Comments 5 x 20.12   Posture/Postural Control   Posture/Postural Control Postural limitations   Postural Limitations Rounded Shoulders;Increased lumbar lordosis;Decreased thoracic kyphosis   ROM / Strength   AROM / PROM / Strength Strength   Strength   Strength Assessment Site Hip;Knee;Ankle   Right/Left Hip Right;Left   Right Hip Flexion 2/5   Right Hip Extension 2-/5   Right Hip ABduction 2-/5   Left Hip Flexion 2+/5   Left Hip Extension 2/5   Left Hip ABduction 2/5   Right/Left Knee Right;Left   Right/Left Ankle Right;Left   Right Ankle Dorsiflexion 3/5   Right Ankle Plantar Flexion 2+/5   Left Ankle Dorsiflexion 4+/5                    [facility] Adult PT Treatment/Exercise - 07/12/15 0001    Exercises   Exercises Lumbar   Lumbar Exercises: Seated   Long Arc Quad on Chair Right;10 reps   Other Seated Lumbar Exercises heel raise/ankle raise x 10    Lumbar Exercises: Supine   Clam 5 reps   Bent Knee Raise 10 reps   Bridge 10 reps   Other Supine Lumbar Exercises hip ab/addcution  x 10                 PT Education - 07/12/15 1025    Education provided Yes   Education Details HEP   Person(s) Educated Patient   Methods Explanation   Comprehension Verbalized understanding          PT Short Term Goals - 07/12/15 1043    PT SHORT TERM GOAL #1   Title Pt core to be increased one grade to allow pt to be able to roll in bed without difficulty    Time 4   Period Weeks   Status New   PT SHORT TERM GOAL #2   Title Pt strength to be increased one grade in bilateral LE to allow pt to ambulate without having to think about advancement of her leg    Time 4   Period Weeks   Status New   PT SHORT TERM GOAL #3   Title Pt to b be able to stand for five minutes to be able to wash dishes at the sink    Time 4   Period Weeks   PT SHORT TERM GOAL #4   Title Pt to be able to single leg stance for 8 seconds on both LE to decrease risk for falls.    Time 4   Period Weeks   Status New           PT Long Term Goals - 07/12/15 1045    PT LONG TERM GOAL #1   Title Pt core strength to be incresed to allow pt to ambulate in an upright position    Time 8   Period Weeks   Status New   PT LONG TERM GOAL #2   Title Pt bilateral lower extremity strength to be increased one grade to allow patient to walk with her rollator for 15 mintues to allow her to walk her sister dog, go to the shed    Time 8   Period Weeks   Status New   PT LONG TERM GOAL #3  Title Pt to be able to stand for 10 minutes in order to make a small meal; ie sandwich    Time 8   Period Weeks   Status New   PT LONG TERM GOAL  #4   Title Pt to be able to single leg stance on both legs for 12 seconds or greater to allow pt to feel confident walking on uneven terrain    Time 8   Period Weeks   Status New               Plan - 07/13/15 1028    Clinical Impression Statement Ms. Leffler ia a 60 yo female who has been diagnosed with MS sinc 1989.  She normally uses a rollator to ambulate but has noticed in the past month that her right leg is getting significantly weaker, (this use to be her strong leg).   She has been referred to skilled physcial therapy by her physician.  Examination demonstrates decreased bed mobility, decreased activity tolerance, decreased balance, decreased strength in both LE's  and a high risk for falls.  Ms. Hoggard will beneft from skilled physical therapy to address these issues and maximize her functional ability while decreasing her risk for falls.    Rehab Potential Good   PT Frequency 2x / week   PT Duration 8 weeks   PT Treatment/Interventions ADLs/Self Care Home Management;Gait training;Functional mobility training;Therapeutic activities;Therapeutic exercise;Balance training;Neuromuscular re-education;Patient/family education   PT Next Visit Plan begin closed chain activity including heel raises, sit to stand, tandem stance, side stepping; standing cone rotationn, standing hip abduction/extension progress to step up and lunging when appropriate.    PT Home Exercise Plan givent    Consulted and Agree with Plan of Care Patient      Patient will benefit from skilled therapeutic intervention in order to improve the following deficits and impairments:  Abnormal gait, Decreased activity tolerance, Decreased balance, Decreased strength, Difficulty walking, Postural dysfunction  Visit Diagnosis: Muscle weakness (generalized) - Plan: PT plan of care cert/re-cert  History of falling - Plan: PT plan of care cert/re-cert  Unsteadiness on feet - Plan: PT plan of care cert/re-cert  Difficulty  in walking, not elsewhere classified - Plan: PT plan of care cert/re-cert      G-Codes - 07-13-2015 1052    Functional Assessment Tool Used time pt is able to walk/ bed mobility    Functional Limitation Mobility: Walking and moving around   Mobility: Walking and Moving Around Current Status (Z6109) At least 60 percent but less than 80 percent impaired, limited or restricted   Mobility: Walking and Moving Around Goal Status (U0454) At least 40 percent but less than 60 percent impaired, limited or restricted       Problem List Patient Active Problem List   Diagnosis Date Noted  . Celiac disease 04/02/2015  . Diarrhea 03/05/2015  . Loss of weight 03/05/2015  . Hyperlipidemia 12/13/2014  . Vitamin D deficiency 12/13/2014  . Type 2 diabetes mellitus with stage 2 chronic kidney disease, with long-term current use of insulin (HCC) 12/06/2014  . Essential hypertension, benign 12/06/2014  . Primary hypothyroidism 12/06/2014  . OSA on CPAP 03/10/2013  . Severe obesity (BMI >= 40) (HCC) 10/11/2012  . Back pain 07/28/2012  . History of falling 01/29/2012  . Bilateral leg weakness 01/29/2012  . Difficulty walking 01/29/2012  . Abnormality of gait 01/09/2012  . Multiple sclerosis (HCC) 01/09/2012    Virgina Organ, PT CLT [phone] 07/13/2015, 10:59 AM  [place]  [facility] [address] [place], [place], [address] Phone: [phone]   Fax:  [phone]  Name: NEVEEN ULRICK MRN: [id number] Date of Birth: 11-04-1955

## 2015-07-15 ENCOUNTER — Ambulatory Visit (HOSPITAL_COMMUNITY): Payer: Medicare Other | Admitting: Physical Therapy

## 2015-07-15 DIAGNOSIS — Z9181 History of falling: Secondary | ICD-10-CM

## 2015-07-15 DIAGNOSIS — R2681 Unsteadiness on feet: Secondary | ICD-10-CM

## 2015-07-15 DIAGNOSIS — M6281 Muscle weakness (generalized): Secondary | ICD-10-CM | POA: Diagnosis not present

## 2015-07-15 DIAGNOSIS — R262 Difficulty in walking, not elsewhere classified: Secondary | ICD-10-CM | POA: Diagnosis not present

## 2015-07-15 NOTE — Patient Instructions (Signed)
Bear Roots on One Leg    Stand near a counter or sink.  Shift weight onto one foot. Lift unweighted foot. Hold _5__ seconds. Repeat __5_ times each side.  Heel Raises    Stand with support. Tighten pelvic floor and hold. With knees straight, raise heels off ground.Repeat _10__ times. Do _2__ times a day.  Functional Quadriceps: Sit to Stand    Sit on edge of chair, feet flat on floor. Stand upright, extending knees fully. Repeat _10___ times per set.  Do _2__ sessions per day.   Tandem Stance    Right foot in front of left, heel touching toe both feet "straight ahead". Stand on Foot Triangle of Support with both feet. Balance in this position _5_ seconds.Do with left foot in front of right.

## 2015-07-15 NOTE — Therapy (Signed)
[facility] [address] [place], [place], [address] Phone: [phone]   Fax:  [phone]  Physical Therapy Treatment  Patient Details  Name: [name] MRN: [id number] Date of Birth: [birth year] Referring Provider: Porfirio Mylar Dohmeier   Encounter Date: 07/15/2015      PT End of Session - 07/15/15 1354    Visit Number 2   Number of Visits 16   Date for PT Re-Evaluation 08/11/15   Authorization Type medicare    Authorization - Visit Number 2   Authorization - Number of Visits 10   PT Start Time 1305   PT Stop Time 1350   PT Time Calculation (min) 45 min   Activity Tolerance Patient tolerated treatment well   Behavior During Therapy [facility] for tasks assessed/performed      Past Medical History  Diagnosis Date  . Diabetes mellitus without complication (HCC)   . Hypertension   . Morbid obesity (HCC)   . High cholesterol   . MS (multiple sclerosis) (HCC) relapsing remitting  . Severe obesity (BMI >= 40) (HCC) 10/11/2012  . Hypothyroidism     Past Surgical History  Procedure Laterality Date  . Cholecystectomy  8/09  . Left oophorectomy Left 8/11  . Colonoscopy      about 7 years  . Colonoscopy with propofol N/A 03/19/2015    ONG:EXBMWU ileum and colon/small internal hemorrhoids  . Biopsy  03/19/2015    Procedure: BIOPSY;  Surgeon: West Bali, MD;  [facility];  Service: Endoscopy;;  random colon biopsy    There were no vitals filed for this visit.      Subjective Assessment - 07/15/15 1319    Subjective PT states she is tired and weak.  States the heat does not help her MS.  Reports complaince with HEP but it wears her out.  No pain currently.   Currently in Pain? No/denies                         [facility] Adult PT Treatment/Exercise - 07/15/15 0001    Lumbar Exercises: Standing   Heel Raises 10 reps   Heel Raises Limitations toeraises with UE assist   Other Standing Lumbar Exercises SLS 5  trials no UE approx 1-2 sec max each   Other Standing Lumbar Exercises tandem stance 2X each 3 seconds max hold with 20 seconds trial each.   Lumbar Exercises: Seated   Sit to Stand Limitations 10 reps from chair no UE assist   Lumbar Exercises: Supine   Clam 10 reps   Bent Knee Raise 10 reps   Bridge 10 reps   Straight Leg Raise 10 reps   Other Supine Lumbar Exercises hip ab/addcution  x 10                 PT Education - 07/15/15 1354    Education provided Yes   Education Details given copy of initial evaluation with explanation of goals.  Updated HEP and reveiwed established HEP   Person(s) Educated Patient   Methods Explanation;Demonstration;Tactile cues;Verbal cues;Handout   Comprehension Verbalized understanding;Returned demonstration;Verbal cues required;Tactile cues required          PT Short Term Goals - 07/12/15 1043    PT SHORT TERM GOAL #1   Title Pt core to be increased one grade to allow pt to be able to roll in bed without difficulty    Time 4   Period Weeks  Status New   PT SHORT TERM GOAL #2   Title Pt strength to be increased one grade in bilateral LE to allow pt to ambulate without having to think about advancement of her leg    Time 4   Period Weeks   Status New   PT SHORT TERM GOAL #3   Title Pt to b be able to stand for five minutes to be able to wash dishes at the sink    Time 4   Period Weeks   PT SHORT TERM GOAL #4   Title Pt to be able to single leg stance for 8 seconds on both LE to decrease risk for falls.    Time 4   Period Weeks   Status New           PT Long Term Goals - 07/12/15 1045    PT LONG TERM GOAL #1   Title Pt core strength to be incresed to allow pt to ambulate in an upright position    Time 8   Period Weeks   Status New   PT LONG TERM GOAL #2   Title Pt bilateral lower extremity strength to be increased one grade to allow patient to walk with her rollator for 15 mintues to allow her to walk her sister dog, go  to the shed    Time 8   Period Weeks   Status New   PT LONG TERM GOAL #3   Title Pt to be able to stand for 10 minutes in order to make a small meal; ie sandwich    Time 8   Period Weeks   Status New   PT LONG TERM GOAL #4   Title Pt to be able to single leg stance on both legs for 12 seconds or greater to allow pt to feel confident walking on uneven terrain    Time 8   Period Weeks   Status New               Plan - 07/15/15 1355    Clinical Impression Statement Reviewed initial evaluation including goals and HEP.  Pt requires cues to complete therex in slower, more controlled motions.  Noted weakness in B LE, however Rt is weaker than Lt.  Updated with standing exericses including heel raise and toe raise.   Pt with extreme diffiuclty completing single leg balance actvity and standing in tandem form more than 2-3 seconds.  Updated HEP to include these with instructions to complete at sink or counter.     Rehab Potential Good   PT Frequency 2x / week   PT Duration 8 weeks   PT Treatment/Interventions ADLs/Self Care Home Management;Gait training;Functional mobility training;Therapeutic activities;Therapeutic exercise;Balance training;Neuromuscular re-education;Patient/family education   PT Next Visit Plan Progress closed chain activity adding side stepping, standing cone rotationn, standing hip abduction/extension.  Progress to step up and lunging when appropriate.    PT Home Exercise Plan reviewed and updated to include STS, SLS, tandem stance, standing heel and toe raises   Consulted and Agree with Plan of Care Patient      Patient will benefit from skilled therapeutic intervention in order to improve the following deficits and impairments:  Abnormal gait, Decreased activity tolerance, Decreased balance, Decreased strength, Difficulty walking, Postural dysfunction  Visit Diagnosis: Muscle weakness (generalized)  History of falling  Unsteadiness on feet  Difficulty in  walking, not elsewhere classified     Problem List Patient Active Problem List   Diagnosis Date Noted  .  Celiac disease 04/02/2015  . Diarrhea 03/05/2015  . Loss of weight 03/05/2015  . Hyperlipidemia 12/13/2014  . Vitamin D deficiency 12/13/2014  . Type 2 diabetes mellitus with stage 2 chronic kidney disease, with long-term current use of insulin (HCC) 12/06/2014  . Essential hypertension, benign 12/06/2014  . Primary hypothyroidism 12/06/2014  . OSA on CPAP 03/10/2013  . Severe obesity (BMI >= 40) (HCC) 10/11/2012  . Back pain 07/28/2012  . History of falling 01/29/2012  . Bilateral leg weakness 01/29/2012  . Difficulty walking 01/29/2012  . Abnormality of gait 01/09/2012  . Multiple sclerosis (HCC) 01/09/2012    Lurena Nida, PTA/CLT [phone]  07/15/2015, 2:00 PM  [facility] [address] [place], [place], [address] Phone: [phone]   Fax:  [phone]  Name: [name] MRN: [id number] Date of Birth: 23-Jan-1956

## 2015-07-18 DIAGNOSIS — E7800 Pure hypercholesterolemia, unspecified: Secondary | ICD-10-CM | POA: Diagnosis not present

## 2015-07-18 DIAGNOSIS — I10 Essential (primary) hypertension: Secondary | ICD-10-CM | POA: Diagnosis not present

## 2015-07-18 DIAGNOSIS — E119 Type 2 diabetes mellitus without complications: Secondary | ICD-10-CM | POA: Diagnosis not present

## 2015-07-19 ENCOUNTER — Ambulatory Visit (HOSPITAL_COMMUNITY): Payer: Medicare Other | Admitting: Physical Therapy

## 2015-07-19 DIAGNOSIS — Z9181 History of falling: Secondary | ICD-10-CM

## 2015-07-19 DIAGNOSIS — M6281 Muscle weakness (generalized): Secondary | ICD-10-CM | POA: Diagnosis not present

## 2015-07-19 DIAGNOSIS — R262 Difficulty in walking, not elsewhere classified: Secondary | ICD-10-CM | POA: Diagnosis not present

## 2015-07-19 DIAGNOSIS — R2681 Unsteadiness on feet: Secondary | ICD-10-CM | POA: Diagnosis not present

## 2015-07-19 NOTE — Therapy (Signed)
[facility] [address] [place], [place], [address] Phone: [phone]   Fax:  [phone]  Physical Therapy Treatment  Patient Details  Name: [name] MRN: [id number] Date of Birth: 12-29-1955 Referring Provider: Porfirio Mylar Dohmeier   Encounter Date: 07/19/2015      PT End of Session - 07/19/15 1118    Visit Number 3   Number of Visits 16   Date for PT Re-Evaluation 08/11/15   Authorization Type medicare    Authorization - Visit Number 2   Authorization - Number of Visits 10   PT Start Time 1036  pt using restroom   PT Stop Time 1117   PT Time Calculation (min) 41 min   Activity Tolerance Patient tolerated treatment well   Behavior During Therapy [facility] for tasks assessed/performed      Past Medical History  Diagnosis Date  . Diabetes mellitus without complication (HCC)   . Hypertension   . Morbid obesity (HCC)   . High cholesterol   . MS (multiple sclerosis) (HCC) relapsing remitting  . Severe obesity (BMI >= 40) (HCC) 10/11/2012  . Hypothyroidism     Past Surgical History  Procedure Laterality Date  . Cholecystectomy  8/09  . Left oophorectomy Left 8/11  . Colonoscopy      about 7 years  . Colonoscopy with propofol N/A 03/19/2015    ONG:EXBMWU ileum and colon/small internal hemorrhoids  . Biopsy  03/19/2015    Procedure: BIOPSY;  Surgeon: West Bali, MD;  [facility];  Service: Endoscopy;;  random colon biopsy    There were no vitals filed for this visit.      Subjective Assessment - 07/19/15 1035    Subjective Pt states things are better today since her last session. She has been doing her HEP.   Pertinent History MS diagnosed in 1989, DM,    Currently in Pain? No/denies                         [facility] Adult PT Treatment/Exercise - 07/19/15 0001    Lumbar Exercises: Standing   Heel Raises 15 reps   Heel Raises Limitations toe raises   Lumbar Exercises: Seated   Long Arc Quad  on Chair 15 reps;Both   Sit to Stand 10 reps  x2 sets   Other Seated Lumbar Exercises clamshells 2x15, red TB             Balance Exercises - 07/19/15 1056    Balance Exercises: Standing   Standing Eyes Opened Narrow base of support (BOS);2 reps;30 secs  ~15 sec, EC   Standing Eyes Closed Narrow base of support (BOS);2 reps;30 secs   Tandem Stance 2 reps;10 secs;Eyes open;Upper extremity support 1   Rockerboard Anterior/posterior;Lateral;EO  x2 min, 2 UE support           PT Education - 07/19/15 1054    Education provided Yes   Education Details discussed POC; reviewed HEP; sit to stand from chair for improved energy expenditure; keeping rollator close to her body to decrease risk of falls   Person(s) Educated Patient   Methods Explanation;Demonstration   Comprehension Verbalized understanding;Returned demonstration          PT Short Term Goals - 07/12/15 1043    PT SHORT TERM GOAL #1   Title Pt core to be increased one grade to allow pt to be able to roll in bed without difficulty    Time  4   Period Weeks   Status New   PT SHORT TERM GOAL #2   Title Pt strength to be increased one grade in bilateral LE to allow pt to ambulate without having to think about advancement of her leg    Time 4   Period Weeks   Status New   PT SHORT TERM GOAL #3   Title Pt to b be able to stand for five minutes to be able to wash dishes at the sink    Time 4   Period Weeks   PT SHORT TERM GOAL #4   Title Pt to be able to single leg stance for 8 seconds on both LE to decrease risk for falls.    Time 4   Period Weeks   Status New           PT Long Term Goals - 07/12/15 1045    PT LONG TERM GOAL #1   Title Pt core strength to be incresed to allow pt to ambulate in an upright position    Time 8   Period Weeks   Status New   PT LONG TERM GOAL #2   Title Pt bilateral lower extremity strength to be increased one grade to allow patient to walk with her rollator for 15 mintues  to allow her to walk her sister dog, go to the shed    Time 8   Period Weeks   Status New   PT LONG TERM GOAL #3   Title Pt to be able to stand for 10 minutes in order to make a small meal; ie sandwich    Time 8   Period Weeks   Status New   PT LONG TERM GOAL #4   Title Pt to be able to single leg stance on both legs for 12 seconds or greater to allow pt to feel confident walking on uneven terrain    Time 8   Period Weeks   Status New               Plan - 07/19/15 1119    Clinical Impression Statement Today's session focused on activity to improve LE strength and balance for improved safety at home. Pt tolerated all increased reps and resistance to therex without report of fatigue. During balance activity she demonstrated increased difficulty with unstable surfaces. Will continue with current POC.   Rehab Potential Good   PT Frequency 2x / week   PT Duration 8 weeks   PT Treatment/Interventions ADLs/Self Care Home Management;Gait training;Functional mobility training;Therapeutic activities;Therapeutic exercise;Balance training;Neuromuscular re-education;Patient/family education   PT Next Visit Plan Progress closed chain activity adding side stepping, standing cone rotation, standing hip abduction/extension.  Progress to step up and lunging when appropriate.    PT Home Exercise Plan no updates    Consulted and Agree with Plan of Care Patient      Patient will benefit from skilled therapeutic intervention in order to improve the following deficits and impairments:  Abnormal gait, Decreased activity tolerance, Decreased balance, Decreased strength, Difficulty walking, Postural dysfunction  Visit Diagnosis: Muscle weakness (generalized)  History of falling  Unsteadiness on feet  Difficulty in walking, not elsewhere classified     Problem List Patient Active Problem List   Diagnosis Date Noted  . Celiac disease 04/02/2015  . Diarrhea 03/05/2015  . Loss of weight  03/05/2015  . Hyperlipidemia 12/13/2014  . Vitamin D deficiency 12/13/2014  . Type 2 diabetes mellitus with stage 2 chronic kidney disease, with long-term  current use of insulin (HCC) 12/06/2014  . Essential hypertension, benign 12/06/2014  . Primary hypothyroidism 12/06/2014  . OSA on CPAP 03/10/2013  . Severe obesity (BMI >= 40) (HCC) 10/11/2012  . Back pain 07/28/2012  . History of falling 01/29/2012  . Bilateral leg weakness 01/29/2012  . Difficulty walking 01/29/2012  . Abnormality of gait 01/09/2012  . Multiple sclerosis (HCC) 01/09/2012   12:17 PM,07/19/2015 Marylyn Ishihara PT, DPT [facility] [phone]  [facility] [address] [place], [place], [address] Phone: [phone]   Fax:  [phone]  Name: MARGEE TRENTHAM MRN: [id number] Date of Birth: 04/19/1955

## 2015-07-23 ENCOUNTER — Ambulatory Visit (HOSPITAL_COMMUNITY): Payer: Medicare Other | Admitting: Physical Therapy

## 2015-07-23 DIAGNOSIS — M6281 Muscle weakness (generalized): Secondary | ICD-10-CM

## 2015-07-23 DIAGNOSIS — R262 Difficulty in walking, not elsewhere classified: Secondary | ICD-10-CM

## 2015-07-23 DIAGNOSIS — Z9181 History of falling: Secondary | ICD-10-CM

## 2015-07-23 DIAGNOSIS — R2681 Unsteadiness on feet: Secondary | ICD-10-CM | POA: Diagnosis not present

## 2015-07-23 NOTE — Therapy (Signed)
[facility] [address] [place], [place], [address] Phone: [phone]   Fax:  [phone]  Physical Therapy Treatment  Patient Details  Name: [name] MRN: [id number] Date of Birth: 12-10-1955 Referring Provider: Porfirio Mylar Dohmeier   Encounter Date: 07/23/2015      PT End of Session - 07/23/15 0951    Visit Number 4   Number of Visits 16   Date for PT Re-Evaluation 08/11/15   Authorization Type medicare    Authorization - Visit Number 4   Authorization - Number of Visits 10   PT Start Time 0902   PT Stop Time 0945   PT Time Calculation (min) 43 min   Activity Tolerance Patient tolerated treatment well   Behavior During Therapy [facility] for tasks assessed/performed      Past Medical History  Diagnosis Date  . Diabetes mellitus without complication (HCC)   . Hypertension   . Morbid obesity (HCC)   . High cholesterol   . MS (multiple sclerosis) (HCC) relapsing remitting  . Severe obesity (BMI >= 40) (HCC) 10/11/2012  . Hypothyroidism     Past Surgical History  Procedure Laterality Date  . Cholecystectomy  8/09  . Left oophorectomy Left 8/11  . Colonoscopy      about 7 years  . Colonoscopy with propofol N/A 03/19/2015    ZSW:FUXNAT ileum and colon/small internal hemorrhoids  . Biopsy  03/19/2015    Procedure: BIOPSY;  Surgeon: West Bali, MD;  [facility];  Service: Endoscopy;;  random colon biopsy    There were no vitals filed for this visit.      Subjective Assessment - 07/23/15 0911    Subjective Pt states that she is doing well with her exercises.   Pertinent History MS diagnosed in 1989, DM,    How long can you sit comfortably? no problem    How long can you stand comfortably? able to stand for less than one  minutes without UE support    How long can you walk comfortably? with rollator able to walk for five minutes    Currently in Pain? No/denies                         [facility]  Adult PT Treatment/Exercise - 07/23/15 0001    Lumbar Exercises: Seated   Other Seated Lumbar Exercises cervical and thoracic excursions              Balance Exercises - 07/23/15 0940    Balance Exercises: Standing   Tandem Stance Eyes open;2 reps   Rockerboard Lateral             PT Short Term Goals - 07/23/15 0954    PT SHORT TERM GOAL #1   Title Pt core to be increased one grade to allow pt to be able to roll in bed without difficulty    Time 4   Period Weeks   Status On-going   PT SHORT TERM GOAL #2   Title Pt strength to be increased one grade in bilateral LE to allow pt to ambulate without having to think about advancement of her leg    Time 4   Period Weeks   Status On-going   PT SHORT TERM GOAL #3   Title Pt to b be able to stand for five minutes to be able to wash dishes at the sink    Time 4   Period Weeks  Status On-going   PT SHORT TERM GOAL #4   Title Pt to be able to single leg stance for 8 seconds on both LE to decrease risk for falls.    Time 4   Period Weeks   Status On-going           PT Long Term Goals - 07/23/15 0954    PT LONG TERM GOAL #1   Title Pt core strength to be incresed to allow pt to ambulate in an upright position    Time 8   Period Weeks   Status On-going   PT LONG TERM GOAL #2   Title Pt bilateral lower extremity strength to be increased one grade to allow patient to walk with her rollator for 15 mintues to allow her to walk her sister dog, go to the shed    Time 8   Period Weeks   Status On-going   PT LONG TERM GOAL #3   Title Pt to be able to stand for 10 minutes in order to make a small meal; ie sandwich    Time 8   Period Weeks   Status On-going   PT LONG TERM GOAL #4   Title Pt to be able to single leg stance on both legs for 12 seconds or greater to allow pt to feel confident walking on uneven terrain    Time 8   Period Weeks   Status New               Plan - 07/23/15 1610    Clinical Impression  Statement Treatment focused on balance with pt needing to take multiple rest breaks due to feeling dizzy.  Pt has not had breakfast; couseled pt on the importance of eating breakfast everyday but expecially if she is going to come to therapy.     Rehab Potential Good   PT Frequency 2x / week   PT Duration 8 weeks   PT Treatment/Interventions ADLs/Self Care Home Management;Gait training;Functional mobility training;Therapeutic activities;Therapeutic exercise;Balance training;Neuromuscular re-education;Patient/family education   PT Next Visit Plan Progress closed chain activity adding side stepping, standing hip abduction/extension.  Progress to step up and lunging when appropriate.    PT Home Exercise Plan no updates    Consulted and Agree with Plan of Care Patient      Patient will benefit from skilled therapeutic intervention in order to improve the following deficits and impairments:  Abnormal gait, Decreased activity tolerance, Decreased balance, Decreased strength, Difficulty walking, Postural dysfunction  Visit Diagnosis: Muscle weakness (generalized)  History of falling  Unsteadiness on feet  Difficulty in walking, not elsewhere classified     Problem List Patient Active Problem List   Diagnosis Date Noted  . Celiac disease 04/02/2015  . Diarrhea 03/05/2015  . Loss of weight 03/05/2015  . Hyperlipidemia 12/13/2014  . Vitamin D deficiency 12/13/2014  . Type 2 diabetes mellitus with stage 2 chronic kidney disease, with long-term current use of insulin (HCC) 12/06/2014  . Essential hypertension, benign 12/06/2014  . Primary hypothyroidism 12/06/2014  . OSA on CPAP 03/10/2013  . Severe obesity (BMI >= 40) (HCC) 10/11/2012  . Back pain 07/28/2012  . History of falling 01/29/2012  . Bilateral leg weakness 01/29/2012  . Difficulty walking 01/29/2012  . Abnormality of gait 01/09/2012  . Multiple sclerosis [facility]) 01/09/2012    Virgina Organ, PT  CLT [phone] 07/23/2015, 9:55 AM  [place] [facility] [address] [place], [place], [address] Phone: [phone]  Fax:  [phone]  Name: [name] MRN: [id number] Date of Birth: Mar 23, 1955

## 2015-07-25 ENCOUNTER — Ambulatory Visit (HOSPITAL_COMMUNITY): Payer: Medicare Other

## 2015-07-25 DIAGNOSIS — Z9181 History of falling: Secondary | ICD-10-CM | POA: Diagnosis not present

## 2015-07-25 DIAGNOSIS — R2681 Unsteadiness on feet: Secondary | ICD-10-CM | POA: Diagnosis not present

## 2015-07-25 DIAGNOSIS — R262 Difficulty in walking, not elsewhere classified: Secondary | ICD-10-CM

## 2015-07-25 DIAGNOSIS — M6281 Muscle weakness (generalized): Secondary | ICD-10-CM

## 2015-07-25 NOTE — Therapy (Signed)
[facility] [address] [place], [place], [address] Phone: [phone]   Fax:  [phone]  Physical Therapy Treatment  Patient Details  Name: [name] MRN: [id number] Date of Birth: 11/03/1955 Referring Provider: Porfirio Mylar Dohmeier   Encounter Date: 07/25/2015      PT End of Session - 07/25/15 1141    Visit Number 5   Number of Visits 16   Date for PT Re-Evaluation 08/11/15   Authorization Type medicare    Authorization - Visit Number 5   Authorization - Number of Visits 10   PT Start Time 1036  Restroom break, began session at 1042   PT Stop Time 1127   PT Time Calculation (min) 51 min   Equipment Utilized During Treatment Gait belt   Activity Tolerance Patient tolerated treatment well;Patient limited by fatigue   Behavior During Therapy [facility] for tasks assessed/performed      Past Medical History  Diagnosis Date  . Diabetes mellitus without complication (HCC)   . Hypertension   . Morbid obesity (HCC)   . High cholesterol   . MS (multiple sclerosis) (HCC) relapsing remitting  . Severe obesity (BMI >= 40) (HCC) 10/11/2012  . Hypothyroidism     Past Surgical History  Procedure Laterality Date  . Cholecystectomy  8/09  . Left oophorectomy Left 8/11  . Colonoscopy      about 7 years  . Colonoscopy with propofol N/A 03/19/2015    ONG:EXBMWU ileum and colon/small internal hemorrhoids  . Biopsy  03/19/2015    Procedure: BIOPSY;  Surgeon: West Bali, MD;  [facility];  Service: Endoscopy;;  random colon biopsy    There were no vitals filed for this visit.      Subjective Assessment - 07/25/15 1045    Subjective No reports of pain today, reports compliance wiht HEP daily.  Reports main difficutly wiht advancing Rt LE.     Pertinent History MS diagnosed in 1989, DM,    Currently in Pain? No/denies             [facility] Adult PT Treatment/Exercise - 07/25/15 0001    Lumbar Exercises: Standing   Heel  Raises 15 reps   Heel Raises Limitations toe raises   Other Standing Lumbar Exercises Hip abduction 2 sets x 5 each 2" holds   Other Standing Lumbar Exercises Hip extension 2x 5 forward flexed on counter 3" holds             Balance Exercises - 07/25/15 1103    Balance Exercises: Standing   Tandem Stance Eyes open;Intermittent upper extremity support;3 reps;30 secs  1 UE A required Rt foot behind, able to tandem stance 7" Lt    Rockerboard Anterior/posterior;Lateral  2 min   Sidestepping 2 reps;Upper extremity support  inside// bars   Marching Limitations 10x5"   OTAGO PROGRAM   Sit to Stand 10 reps, no support             PT Short Term Goals - 07/23/15 0954    PT SHORT TERM GOAL #1   Title Pt core to be increased one grade to allow pt to be able to roll in bed without difficulty    Time 4   Period Weeks   Status On-going   PT SHORT TERM GOAL #2   Title Pt strength to be increased one grade in bilateral LE to allow pt to ambulate without having to think about advancement of her leg  Time 4   Period Weeks   Status On-going   PT SHORT TERM GOAL #3   Title Pt to b be able to stand for five minutes to be able to wash dishes at the sink    Time 4   Period Weeks   Status On-going   PT SHORT TERM GOAL #4   Title Pt to be able to single leg stance for 8 seconds on both LE to decrease risk for falls.    Time 4   Period Weeks   Status On-going           PT Long Term Goals - 07/23/15 0954    PT LONG TERM GOAL #1   Title Pt core strength to be incresed to allow pt to ambulate in an upright position    Time 8   Period Weeks   Status On-going   PT LONG TERM GOAL #2   Title Pt bilateral lower extremity strength to be increased one grade to allow patient to walk with her rollator for 15 mintues to allow her to walk her sister dog, go to the shed    Time 8   Period Weeks   Status On-going   PT LONG TERM GOAL #3   Title Pt to be able to stand for 10 minutes in  order to make a small meal; ie sandwich    Time 8   Period Weeks   Status On-going   PT LONG TERM GOAL #4   Title Pt to be able to single leg stance on both legs for 12 seconds or greater to allow pt to feel confident walking on uneven terrain    Time 8   Period Weeks   Status New               Plan - 07/25/15 1142    Clinical Impression Statement Session focus on CKC for LE strengthening and balance training.  Pt limited by fatigue with frequent rest breaks required.  No reports of dizziness through session.  Therapist facilition for cueing for form with exercises and safety with balance activities.  Added sidestepping for glut med strengthenig and balance with UE A.  Reduced UE A with tandem stance, able to complete with Lt foot behind no HHA for 7" tops.  No reports of pain through session.     Rehab Potential Good   PT Frequency 2x / week   PT Duration 8 weeks   PT Treatment/Interventions ADLs/Self Care Home Management;Gait training;Functional mobility training;Therapeutic activities;Therapeutic exercise;Balance training;Neuromuscular re-education;Patient/family education   PT Next Visit Plan Progress closed chain strengthening and balance activities standing hip abduction/extension.  Progress to step up and lunging when appropriate.       Patient will benefit from skilled therapeutic intervention in order to improve the following deficits and impairments:  Abnormal gait, Decreased activity tolerance, Decreased balance, Decreased strength, Difficulty walking, Postural dysfunction  Visit Diagnosis: Muscle weakness (generalized)  History of falling  Unsteadiness on feet  Difficulty in walking, not elsewhere classified     Problem List Patient Active Problem List   Diagnosis Date Noted  . Celiac disease 04/02/2015  . Diarrhea 03/05/2015  . Loss of weight 03/05/2015  . Hyperlipidemia 12/13/2014  . Vitamin D deficiency 12/13/2014  . Type 2 diabetes mellitus with  stage 2 chronic kidney disease, with long-term current use of insulin (HCC) 12/06/2014  . Essential hypertension, benign 12/06/2014  . Primary hypothyroidism 12/06/2014  . OSA on CPAP 03/10/2013  . Severe obesity (  BMI >= 40) (HCC) 10/11/2012  . Back pain 07/28/2012  . History of falling 01/29/2012  . Bilateral leg weakness 01/29/2012  . Difficulty walking 01/29/2012  . Abnormality of gait 01/09/2012  . Multiple sclerosis (HCC) 01/09/2012   Becky Sax, LPTA; CBIS [phone]  Juel Burrow 07/25/2015, 11:49 AM  [place] [facility] [address] [place], [place], [address] Phone: [phone]   Fax:  [phone]  Name: AMRITA RADU MRN: [id number] Date of Birth: 10-Aug-1955

## 2015-07-29 ENCOUNTER — Other Ambulatory Visit: Payer: Self-pay | Admitting: "Endocrinology

## 2015-07-29 ENCOUNTER — Other Ambulatory Visit: Payer: Self-pay | Admitting: Neurology

## 2015-08-01 ENCOUNTER — Ambulatory Visit (HOSPITAL_COMMUNITY): Payer: Medicare Other | Attending: Neurology

## 2015-08-01 DIAGNOSIS — R2681 Unsteadiness on feet: Secondary | ICD-10-CM | POA: Diagnosis not present

## 2015-08-01 DIAGNOSIS — R262 Difficulty in walking, not elsewhere classified: Secondary | ICD-10-CM | POA: Insufficient documentation

## 2015-08-01 DIAGNOSIS — Z9181 History of falling: Secondary | ICD-10-CM | POA: Diagnosis not present

## 2015-08-01 DIAGNOSIS — M6281 Muscle weakness (generalized): Secondary | ICD-10-CM | POA: Diagnosis not present

## 2015-08-01 DIAGNOSIS — Z299 Encounter for prophylactic measures, unspecified: Secondary | ICD-10-CM | POA: Diagnosis not present

## 2015-08-01 DIAGNOSIS — I10 Essential (primary) hypertension: Secondary | ICD-10-CM | POA: Diagnosis not present

## 2015-08-01 DIAGNOSIS — Z6839 Body mass index (BMI) 39.0-39.9, adult: Secondary | ICD-10-CM | POA: Diagnosis not present

## 2015-08-01 DIAGNOSIS — E119 Type 2 diabetes mellitus without complications: Secondary | ICD-10-CM | POA: Diagnosis not present

## 2015-08-01 DIAGNOSIS — Z789 Other specified health status: Secondary | ICD-10-CM | POA: Diagnosis not present

## 2015-08-01 NOTE — Therapy (Signed)
[facility] [address] [place], [place], [address] Phone: [phone]   Fax:  [phone]  Physical Therapy Treatment  Patient Details  Name: [name] MRN: [id number] Date of Birth: 1955/06/05 Referring Provider: Porfirio Mylar Dohmeier   Encounter Date: 08/01/2015      PT End of Session - 08/01/15 0953    Visit Number 6   Number of Visits 16   Date for PT Re-Evaluation 08/11/15   Authorization Type medicare    Authorization - Visit Number 6   Authorization - Number of Visits 10   PT Start Time 0903   PT Stop Time 0947   PT Time Calculation (min) 44 min   Equipment Utilized During Treatment Gait belt   Activity Tolerance Patient tolerated treatment well;Patient limited by fatigue   Behavior During Therapy [facility] for tasks assessed/performed      Past Medical History  Diagnosis Date  . Diabetes mellitus without complication (HCC)   . Hypertension   . Morbid obesity (HCC)   . High cholesterol   . MS (multiple sclerosis) (HCC) relapsing remitting  . Severe obesity (BMI >= 40) (HCC) 10/11/2012  . Hypothyroidism     Past Surgical History  Procedure Laterality Date  . Cholecystectomy  8/09  . Left oophorectomy Left 8/11  . Colonoscopy      about 7 years  . Colonoscopy with propofol N/A 03/19/2015    ONG:EXBMWU ileum and colon/small internal hemorrhoids  . Biopsy  03/19/2015    Procedure: BIOPSY;  Surgeon: West Bali, MD;  [facility];  Service: Endoscopy;;  random colon biopsy    There were no vitals filed for this visit.      Subjective Assessment - 08/01/15 0902    Subjective No reports of pain today, has a doctor's apt in [place] this afternoon.  Reports compliance with HEP daily.   Pertinent History MS diagnosed in 1989, DM,    Currently in Pain? No/denies             [facility] Adult PT Treatment/Exercise - 08/01/15 0001    Lumbar Exercises: Standing   Heel Raises 20 reps   Heel Raises Limitations toe  raises   Forward Lunge 10 reps   Forward Lunge Limitations 6in step UEA   Other Standing Lumbar Exercises Hip abduction 2 sets x 5 each 2" holds             Balance Exercises - 08/01/15 0935    Balance Exercises: Standing   Standing Eyes Opened Narrow base of support (BOS);2 reps;30 secs;Foam/compliant surface;Head turns  10 head turns on airex   Tandem Stance Eyes open;Intermittent upper extremity support;3 reps;30 secs   Rockerboard Anterior/posterior;Lateral  2 min, 2 UE A   Sidestepping 3 reps  1 set with HHA, 2 no UE A   Marching Limitations 10x5"  toe tapping             PT Short Term Goals - 07/23/15 0954    PT SHORT TERM GOAL #1   Title Pt core to be increased one grade to allow pt to be able to roll in bed without difficulty    Time 4   Period Weeks   Status On-going   PT SHORT TERM GOAL #2   Title Pt strength to be increased one grade in bilateral LE to allow pt to ambulate without having to think about advancement of her leg    Time 4   Period Weeks  Status On-going   PT SHORT TERM GOAL #3   Title Pt to b be able to stand for five minutes to be able to wash dishes at the sink    Time 4   Period Weeks   Status On-going   PT SHORT TERM GOAL #4   Title Pt to be able to single leg stance for 8 seconds on both LE to decrease risk for falls.    Time 4   Period Weeks   Status On-going           PT Long Term Goals - 07/23/15 0954    PT LONG TERM GOAL #1   Title Pt core strength to be incresed to allow pt to ambulate in an upright position    Time 8   Period Weeks   Status On-going   PT LONG TERM GOAL #2   Title Pt bilateral lower extremity strength to be increased one grade to allow patient to walk with her rollator for 15 mintues to allow her to walk her sister dog, go to the shed    Time 8   Period Weeks   Status On-going   PT LONG TERM GOAL #3   Title Pt to be able to stand for 10 minutes in order to make a small meal; ie sandwich    Time  8   Period Weeks   Status On-going   PT LONG TERM GOAL #4   Title Pt to be able to single leg stance on both legs for 12 seconds or greater to allow pt to feel confident walking on uneven terrain    Time 8   Period Weeks   Status New               Plan - 08/01/15 1610    Clinical Impression Statement Pt improving activity tolerance with less seated rest breaks required this session.  Session focus on functional strengthening and progressed balance training on static and dynamic surfaces with less UE A and head turns on Airex.  Began forward lunges for functional strengthening.  No reports of pain through session.  Pt did report ligth headedness, pt seated, given water and BP taken at 120/78 mmHg, symptoms resolved quickly.   Rehab Potential Good   PT Frequency 2x / week   PT Duration 8 weeks   PT Treatment/Interventions ADLs/Self Care Home Management;Gait training;Functional mobility training;Therapeutic activities;Therapeutic exercise;Balance training;Neuromuscular re-education;Patient/family education   PT Next Visit Plan Progress closed chain strengthening and balance activities standing hip abduction/extension.  Progress to step ups next session.        Patient will benefit from skilled therapeutic intervention in order to improve the following deficits and impairments:  Abnormal gait, Decreased activity tolerance, Decreased balance, Decreased strength, Difficulty walking, Postural dysfunction  Visit Diagnosis: Muscle weakness (generalized)  History of falling  Unsteadiness on feet  Difficulty in walking, not elsewhere classified     Problem List Patient Active Problem List   Diagnosis Date Noted  . Celiac disease 04/02/2015  . Diarrhea 03/05/2015  . Loss of weight 03/05/2015  . Hyperlipidemia 12/13/2014  . Vitamin D deficiency 12/13/2014  . Type 2 diabetes mellitus with stage 2 chronic kidney disease, with long-term current use of insulin (HCC) 12/06/2014  .  Essential hypertension, benign 12/06/2014  . Primary hypothyroidism 12/06/2014  . OSA on CPAP 03/10/2013  . Severe obesity (BMI >= 40) (HCC) 10/11/2012  . Back pain 07/28/2012  . History of falling 01/29/2012  . Bilateral leg weakness  01/29/2012  . Difficulty walking 01/29/2012  . Abnormality of gait 01/09/2012  . Multiple sclerosis (HCC) 01/09/2012   Becky Sax, LPTA; CBIS [phone]  Juel Burrow 08/01/2015, 10:10 AM  [place] [facility] [address] [place], [place], [address] Phone: [phone]   Fax:  [phone]  Name: [name] MRN: [id number] Date of Birth: 23-Apr-1955

## 2015-08-06 ENCOUNTER — Ambulatory Visit (HOSPITAL_COMMUNITY): Payer: Medicare Other

## 2015-08-06 DIAGNOSIS — R262 Difficulty in walking, not elsewhere classified: Secondary | ICD-10-CM | POA: Diagnosis not present

## 2015-08-06 DIAGNOSIS — R2681 Unsteadiness on feet: Secondary | ICD-10-CM | POA: Diagnosis not present

## 2015-08-06 DIAGNOSIS — Z9181 History of falling: Secondary | ICD-10-CM

## 2015-08-06 DIAGNOSIS — M6281 Muscle weakness (generalized): Secondary | ICD-10-CM

## 2015-08-06 NOTE — Therapy (Signed)
[place] [facility] [address] [place], [place], [address] Phone: [phone]   Fax:  [phone]  Physical Therapy Treatment  Patient Details  Name: [name] MRN: [id number] Date of Birth: [birth year] Referring Provider: Porfirio Mylar Dohmeier   Encounter Date: 08/06/2015      PT End of Session - 08/06/15 0914    Visit Number 7   Number of Visits 16   Date for PT Re-Evaluation 08/11/15   Authorization Type medicare    Authorization - Visit Number 7   Authorization - Number of Visits 10   PT Start Time 0903   PT Stop Time 0944   PT Time Calculation (min) 41 min   Equipment Utilized During Treatment Gait belt   Activity Tolerance Patient tolerated treatment well;Patient limited by fatigue   Behavior During Therapy [facility] for tasks assessed/performed      Past Medical History  Diagnosis Date  . Diabetes mellitus without complication (HCC)   . Hypertension   . Morbid obesity (HCC)   . High cholesterol   . MS (multiple sclerosis) (HCC) relapsing remitting  . Severe obesity (BMI >= 40) (HCC) 10/11/2012  . Hypothyroidism     Past Surgical History  Procedure Laterality Date  . Cholecystectomy  8/09  . Left oophorectomy Left 8/11  . Colonoscopy      about 7 years  . Colonoscopy with propofol N/A 03/19/2015    DXI:PJASNK ileum and colon/small internal hemorrhoids  . Biopsy  03/19/2015    Procedure: BIOPSY;  Surgeon: West Bali, MD;  [facility];  Service: Endoscopy;;  random colon biopsy    There were no vitals filed for this visit.      Subjective Assessment - 08/06/15 0906    Subjective Pt reports the hot weather is rough, reports intermittent pain on Rt hip pain scale range from 4-8/10 feels relief with exercises.  Reports comliance with HEP and has been riding stationary bike at home.   Pertinent History MS diagnosed in 1989, DM,    Currently in Pain? Yes   Pain Score 4   Range from 4-8/10   Pain Location Hip   Pain Orientation Right   Pain Descriptors / Indicators Sharp   Pain Type Chronic pain   Pain Onset 1 to 4 weeks ago   Pain Frequency Intermittent   Pain Relieving Factors exercise   Effect of Pain on Daily Activities minimal effects with ADLs             [facility] Adult PT Treatment/Exercise - 08/06/15 0001    Lumbar Exercises: Standing   Heel Raises 20 reps   Heel Raises Limitations toe raises   Forward Lunge 10 reps   Forward Lunge Limitations 6in step UEA   Other Standing Lumbar Exercises Hip abduction 2 sets x 5 each 2" holds   Other Standing Lumbar Exercises Step up 4in step 10x each LE             Balance Exercises - 08/06/15 0935    Balance Exercises: Standing   Tandem Stance Eyes open;Intermittent upper extremity support;3 reps;30 secs   Step Ups Forward;4 inch;UE support 2  10 reps Bil LE   Sidestepping 3 reps  no UE A inside // bars   Marching Limitations 10x5"  toe tapping on 6 in step             PT Short Term Goals - 07/23/15 0954    PT SHORT TERM GOAL #1  Title Pt core to be increased one grade to allow pt to be able to roll in bed without difficulty    Time 4   Period Weeks   Status On-going   PT SHORT TERM GOAL #2   Title Pt strength to be increased one grade in bilateral LE to allow pt to ambulate without having to think about advancement of her leg    Time 4   Period Weeks   Status On-going   PT SHORT TERM GOAL #3   Title Pt to b be able to stand for five minutes to be able to wash dishes at the sink    Time 4   Period Weeks   Status On-going   PT SHORT TERM GOAL #4   Title Pt to be able to single leg stance for 8 seconds on both LE to decrease risk for falls.    Time 4   Period Weeks   Status On-going           PT Long Term Goals - 07/23/15 0954    PT LONG TERM GOAL #1   Title Pt core strength to be incresed to allow pt to ambulate in an upright position    Time 8   Period Weeks   Status On-going   PT LONG TERM GOAL #2    Title Pt bilateral lower extremity strength to be increased one grade to allow patient to walk with her rollator for 15 mintues to allow her to walk her sister dog, go to the shed    Time 8   Period Weeks   Status On-going   PT LONG TERM GOAL #3   Title Pt to be able to stand for 10 minutes in order to make a small meal; ie sandwich    Time 8   Period Weeks   Status On-going   PT LONG TERM GOAL #4   Title Pt to be able to single leg stance on both legs for 12 seconds or greater to allow pt to feel confident walking on uneven terrain    Time 8   Period Weeks   Status New               Plan - 08/06/15 1240    Clinical Impression Statement Session foucs on functional strengthening with CKC exercises and balance training.  Added step ups for functional strengthening with increased difficulty with Rt LE.  Seated rest breaks required throuigh session, pt with decreased activitiy tolerance this session, pt feels related to the heat outside.     Rehab Potential Good   PT Frequency 2x / week   PT Duration 8 weeks   PT Treatment/Interventions ADLs/Self Care Home Management;Gait training;Functional mobility training;Therapeutic activities;Therapeutic exercise;Balance training;Neuromuscular re-education;Patient/family education   PT Next Visit Plan Progress closed chain strengthening and balance activities standing hip abduction/extension.  Continue with step ups next session.        Patient will benefit from skilled therapeutic intervention in order to improve the following deficits and impairments:  Abnormal gait, Decreased activity tolerance, Decreased balance, Decreased strength, Difficulty walking, Postural dysfunction  Visit Diagnosis: Muscle weakness (generalized)  History of falling  Unsteadiness on feet  Difficulty in walking, not elsewhere classified     Problem List Patient Active Problem List   Diagnosis Date Noted  . Celiac disease 04/02/2015  . Diarrhea  03/05/2015  . Loss of weight 03/05/2015  . Hyperlipidemia 12/13/2014  . Vitamin D deficiency 12/13/2014  . Type 2 diabetes mellitus  with stage 2 chronic kidney disease, with long-term current use of insulin (HCC) 12/06/2014  . Essential hypertension, benign 12/06/2014  . Primary hypothyroidism 12/06/2014  . OSA on CPAP 03/10/2013  . Severe obesity (BMI >= 40) (HCC) 10/11/2012  . Back pain 07/28/2012  . History of falling 01/29/2012  . Bilateral leg weakness 01/29/2012  . Difficulty walking 01/29/2012  . Abnormality of gait 01/09/2012  . Multiple sclerosis (HCC) 01/09/2012   Becky Sax, LPTA; CBIS [phone]  Juel Burrow 08/06/2015, 12:44 PM  [place] [facility] [address] [place], [place], [address] Phone: [phone]   Fax:  [phone]  Name: [name] MRN: [id number] Date of Birth: 1955-01-28

## 2015-08-08 ENCOUNTER — Ambulatory Visit (HOSPITAL_COMMUNITY): Payer: Medicare Other

## 2015-08-08 DIAGNOSIS — R262 Difficulty in walking, not elsewhere classified: Secondary | ICD-10-CM

## 2015-08-08 DIAGNOSIS — Z9181 History of falling: Secondary | ICD-10-CM | POA: Diagnosis not present

## 2015-08-08 DIAGNOSIS — R2681 Unsteadiness on feet: Secondary | ICD-10-CM

## 2015-08-08 DIAGNOSIS — M6281 Muscle weakness (generalized): Secondary | ICD-10-CM

## 2015-08-08 NOTE — Therapy (Signed)
[facility] [address] [place], [place], [address] Phone: [phone]   Fax:  [phone]  Physical Therapy Treatment  Patient Details  Name: [name] MRN: [id number] Date of Birth: 06/29/1955 Referring Provider: Porfirio Mylar Dohmeier   Encounter Date: 08/08/2015      PT End of Session - 08/08/15 0944    Visit Number 8   Number of Visits 16   Date for PT Re-Evaluation 08/11/15   Authorization Type medicare    Authorization - Visit Number 8   Authorization - Number of Visits 10   PT Start Time [phone]  began session at 0910 following restroom break   PT Stop Time 0944   PT Time Calculation (min) 39 min   Equipment Utilized During Treatment Gait belt   Activity Tolerance Patient limited by fatigue;Patient limited by pain  Rt hip catching  3x during  session   Behavior During Therapy [facility] for tasks assessed/performed      Past Medical History  Diagnosis Date  . Diabetes mellitus without complication (HCC)   . Hypertension   . Morbid obesity (HCC)   . High cholesterol   . MS (multiple sclerosis) (HCC) relapsing remitting  . Severe obesity (BMI >= 40) (HCC) 10/11/2012  . Hypothyroidism     Past Surgical History  Procedure Laterality Date  . Cholecystectomy  8/09  . Left oophorectomy Left 8/11  . Colonoscopy      about 7 years  . Colonoscopy with propofol N/A 03/19/2015    XBM:WUXLKG ileum and colon/small internal hemorrhoids  . Biopsy  03/19/2015    Procedure: BIOPSY;  Surgeon: West Bali, MD;  [facility];  Service: Endoscopy;;  random colon biopsy    There were no vitals filed for this visit.      Subjective Assessment - 08/08/15 0909    Subjective Pt stated she is feeling rough due to the hot weather, very tired today.   Pertinent History MS diagnosed in 1989, DM,    Currently in Pain? Yes   Pain Score --  no pain while standing, increased to 6/10 standing and gait   Pain Location Hip   Pain  Orientation Right   Pain Descriptors / Indicators Sharp;Stabbing  sharp stabbing with gait   Pain Type Chronic pain   Pain Onset 1 to 4 weeks ago   Pain Frequency Intermittent   Aggravating Factors  standing and gait   Pain Relieving Factors exercises   Effect of Pain on Daily Activities minimal effects with ADLs             [facility] Adult PT Treatment/Exercise - 08/08/15 0001    Lumbar Exercises: Standing   Heel Raises 20 reps   Heel Raises Limitations toe raises   Forward Lunge 10 reps   Forward Lunge Limitations 6in step UEA   Other Standing Lumbar Exercises gait training x50 ft with cueing to reduce shuffled gait with Rt LE             Balance Exercises - 08/08/15 0935    Balance Exercises: Standing   Step Ups Forward;4 inch;UE support 2  10 reps Bil LE   Sidestepping 2 reps  no UE A inside // bars for safety   Marching Limitations 10x5"  toe tapping on 6in step             PT Short Term Goals - 07/23/15 0954    PT SHORT TERM GOAL #1   Title Pt core  to be increased one grade to allow pt to be able to roll in bed without difficulty    Time 4   Period Weeks   Status On-going   PT SHORT TERM GOAL #2   Title Pt strength to be increased one grade in bilateral LE to allow pt to ambulate without having to think about advancement of her leg    Time 4   Period Weeks   Status On-going   PT SHORT TERM GOAL #3   Title Pt to b be able to stand for five minutes to be able to wash dishes at the sink    Time 4   Period Weeks   Status On-going   PT SHORT TERM GOAL #4   Title Pt to be able to single leg stance for 8 seconds on both LE to decrease risk for falls.    Time 4   Period Weeks   Status On-going           PT Long Term Goals - 07/23/15 0954    PT LONG TERM GOAL #1   Title Pt core strength to be incresed to allow pt to ambulate in an upright position    Time 8   Period Weeks   Status On-going   PT LONG TERM GOAL #2   Title Pt bilateral lower  extremity strength to be increased one grade to allow patient to walk with her rollator for 15 mintues to allow her to walk her sister dog, go to the shed    Time 8   Period Weeks   Status On-going   PT LONG TERM GOAL #3   Title Pt to be able to stand for 10 minutes in order to make a small meal; ie sandwich    Time 8   Period Weeks   Status On-going   PT LONG TERM GOAL #4   Title Pt to be able to single leg stance on both legs for 12 seconds or greater to allow pt to feel confident walking on uneven terrain    Time 8   Period Weeks   Status New               Plan - 08/08/15 1006    Clinical Impression Statement Continued [facility] for functional strengthening and balance training.  Pt limited by fatigue through session with increased rest breaks required.  Pt reports decreased peripheral vision with fatigue.  Rt hip buckled 3 times during lunges requiring therapist facilitation for safety.  Increased cueing required with gait training to reduce shuffled gait mechanics with Rt LE.   Rehab Potential Good   PT Frequency 2x / week   PT Duration 8 weeks   PT Treatment/Interventions ADLs/Self Care Home Management;Gait training;Functional mobility training;Therapeutic activities;Therapeutic exercise;Balance training;Neuromuscular re-education;Patient/family education   PT Next Visit Plan Progress closed chain strengthening and balance activities standing hip abduction/extension.  Continue with step ups next session.        Patient will benefit from skilled therapeutic intervention in order to improve the following deficits and impairments:  Abnormal gait, Decreased activity tolerance, Decreased balance, Decreased strength, Difficulty walking, Postural dysfunction  Visit Diagnosis: Muscle weakness (generalized)  History of falling  Unsteadiness on feet  Difficulty in walking, not elsewhere classified     Problem List Patient Active Problem List   Diagnosis Date Noted  . Celiac  disease 04/02/2015  . Diarrhea 03/05/2015  . Loss of weight 03/05/2015  . Hyperlipidemia 12/13/2014  . Vitamin D deficiency 12/13/2014  .  Type 2 diabetes mellitus with stage 2 chronic kidney disease, with long-term current use of insulin (HCC) 12/06/2014  . Essential hypertension, benign 12/06/2014  . Primary hypothyroidism 12/06/2014  . OSA on CPAP 03/10/2013  . Severe obesity (BMI >= 40) (HCC) 10/11/2012  . Back pain 07/28/2012  . History of falling 01/29/2012  . Bilateral leg weakness 01/29/2012  . Difficulty walking 01/29/2012  . Abnormality of gait 01/09/2012  . Multiple sclerosis (HCC) 01/09/2012   Becky Sax, LPTA; CBIS [phone]  Juel Burrow 08/08/2015, 12:07 PM  [facility] [address] [place], [place], [address] Phone: [phone]   Fax:  [phone]  Name: [name] MRN: [id number] Date of Birth: 24-Jun-1955

## 2015-08-13 ENCOUNTER — Ambulatory Visit (HOSPITAL_COMMUNITY): Payer: Medicare Other | Admitting: Physical Therapy

## 2015-08-13 DIAGNOSIS — M6281 Muscle weakness (generalized): Secondary | ICD-10-CM | POA: Diagnosis not present

## 2015-08-13 DIAGNOSIS — R262 Difficulty in walking, not elsewhere classified: Secondary | ICD-10-CM | POA: Diagnosis not present

## 2015-08-13 DIAGNOSIS — R2681 Unsteadiness on feet: Secondary | ICD-10-CM

## 2015-08-13 DIAGNOSIS — Z9181 History of falling: Secondary | ICD-10-CM

## 2015-08-13 NOTE — Therapy (Signed)
[facility] [address] [place], [place], [address] Phone: [phone]   Fax:  [phone]  Physical Therapy Treatment  Patient Details  Name: [name] MRN: [id number] Date of Birth: September 11, 1955 Referring Provider: Asencion Partridge Dohmeier   Encounter Date: 08/13/2015      PT End of Session - 08/13/15 0937    Visit Number 9   Number of Visits 16   Date for PT Re-Evaluation 08/11/15   Authorization Type medicare    Authorization - Visit Number 9 [place] done at visit 9    Authorization - Number of Visits 19   PT Start Time 0900   PT Stop Time 0945   PT Time Calculation (min) 45 min   Equipment Utilized During Treatment Gait belt   Activity Tolerance Patient limited by fatigue;Patient limited by pain  Rt hip catching  3x during  session   Behavior During Therapy [facility] for tasks assessed/performed      Past Medical History  Diagnosis Date  . Diabetes mellitus without complication ([place])   . Hypertension   . Morbid obesity ([place])   . High cholesterol   . MS (multiple sclerosis) (HCC) relapsing remitting  . Severe obesity (BMI >= 40) ([place]) 10/11/2012  . Hypothyroidism     Past Surgical History  Procedure Laterality Date  . Cholecystectomy  8/09  . Left oophorectomy Left 8/11  . Colonoscopy      about 7 years  . Colonoscopy with propofol N/A 03/19/2015    MEQ:ASTMHD ileum and colon/small internal hemorrhoids  . Biopsy  03/19/2015    Procedure: BIOPSY;  Surgeon: Danie Binder, MD;  [facility];  Service: Endoscopy;;  random colon biopsy    There were no vitals filed for this visit.      Subjective Assessment - 08/13/15 0906    Subjective Pt states that she is exercising twice a day now.  She is having a bad day today due to the heat.  Today she feels an electrical sensation all through her legs    How long can you sit comfortably? unable    How long can you stand comfortably? Pt is able to do her dishes and not have  to sit down she was having to sit down .    How long can you walk comfortably? Pt is able to walk with her rollator for 15 minutes at this time    Currently in Pain? No/denies            [facility] PT Assessment - 08/13/15 0001    Assessment   Medical Diagnosis Abnormal gait   Referring Provider Asencion Partridge Dohmeier    Onset Date/Surgical Date 06/11/15  acute exacerbation    Next MD Visit 8/22 /2017   Prior Therapy not for this episode   Precautions   Precautions Fall   Restrictions   Weight Bearing Restrictions No   Balance Screen   Has the patient fallen in the past 6 months Yes  No falls since therapy    How many times? 5   Has the patient had a decrease in activity level because of a fear of falling?  Yes   Is the patient reluctant to leave their home because of a fear of falling?  No   Home Ecologist residence   Prior Function   Level of Independence Independent with community mobility with device   Vocation On disability   Leisure volunteers at [employer]  Cognition   Overall Cognitive Status Within Functional Limits for tasks assessed   Observation/Other Assessments   Observations Pt has significant difficulty rolling supine to Lt or Rt sidelying postition.    Functional Tests   Functional tests Single leg stance;Sit to Stand   Single Leg Stance   Comments Lt 3 seconds; Rt 0 ; was 0 bilaterallly    Sit to Stand   Comments 5 x 20.10  was 20.12    Posture/Postural Control   Posture/Postural Control Postural limitations   Postural Limitations Rounded Shoulders;Increased lumbar lordosis;Decreased thoracic kyphosis   Strength   Right Hip Flexion 2/5  was 2/5    Right Hip Extension 2+/5  wa 2-/5    Right Hip ABduction 2-/5  was 2-/5    Left Hip Flexion 2+/5  was 2+/5    Left Hip Extension 3/5  was 2/5    Left Hip ABduction 2/5  was 2/5    Right/Left Knee Right;Left   Right Knee Flexion 3-/5   Right Knee Extension 4/5   Left Knee  Flexion 3/5   Left Knee Extension 5/5   Right Ankle Dorsiflexion 3/5  was 3/5    Right Ankle Plantar Flexion 2+/5  was 2+/5   Left Ankle Dorsiflexion 4+/5  was 4+/5                All exercise done bilaterally       [facility] Adult PT Treatment/Exercise - 08/13/15 0001    Lumbar Exercises: Standing   Heel Raises 10 reps   Functional Squats 10 reps   Other Standing Lumbar Exercises SLS x 5    Lumbar Exercises: Supine   Heel Slides 10 reps   Bridge 10 reps   Lumbar Exercises: Quadruped   Straight Leg Raise 5 reps   Straight Leg Raises Limitations --  ham curls x 5                PT Education - 08/13/15 0936    Education provided Yes   Education Details The disease is progressive; the importance of completing HEP each day           PT Short Term Goals - 08/13/15 7510    PT SHORT TERM GOAL #1   Title Pt core to be increased one grade to allow pt to be able to roll in bed without difficulty    Time 4   Period Weeks   Status Achieved   PT SHORT TERM GOAL #2   Title Pt strength to be increased one grade in bilateral LE to allow pt to ambulate without having to think about advancement of her leg    Time 4   Period Weeks   Status Partially Met  good days and bad days    PT SHORT TERM GOAL #3   Title Pt to b be able to stand for five minutes to be able to wash dishes at the sink    Time 4   Period Weeks   Status Achieved   PT SHORT TERM GOAL #4   Title Pt to be able to single leg stance for 8 seconds on both LE to decrease risk for falls.    Time 4   Period Weeks   Status Not Met           PT Long Term Goals - 08/13/15 2585    PT LONG TERM GOAL #1   Title Pt core strength to be incresed to allow pt to ambulate  in an upright position    Time 8   Period Weeks   Status On-going   PT LONG TERM GOAL #2   Title Pt bilateral lower extremity strength to be increased one grade to allow patient to walk with her rollator for 15 mintues to allow her to  walk her sister dog, go to the shed    Time 8   Period Weeks   Status Not Met   PT LONG TERM GOAL #3   Title Pt to be able to stand for 10 minutes in order to make a small meal; ie sandwich    Time 8   Period Weeks   Status Achieved   PT LONG TERM GOAL #4   Title Pt to be able to single leg stance on both legs for 12 seconds or greater to allow pt to feel confident walking on uneven terrain    Time 8   Period Weeks   Status Not Met               Plan - 09/12/2015 0946    Clinical Impression Statement Pt has not fallen since therapy has started.  Pt has made small gains.  Therapist and pt discussed the nature of MS and that small gains is what is expected.  Pt will continue to benefit from skilled PT to maximize her functional ability and decrease fall risk.    Rehab Potential Good   PT Frequency 2x / week   PT Duration 8 weeks   PT Treatment/Interventions ADLs/Self Care Home Management;Gait training;Functional mobility training;Therapeutic activities;Therapeutic exercise;Balance training;Neuromuscular re-education;Patient/family education   PT Next Visit Plan Progress closed chain strengthening and balance activities standing hip abduction/extension.  Continue with step ups next session.        Patient will benefit from skilled therapeutic intervention in order to improve the following deficits and impairments:  Abnormal gait, Decreased activity tolerance, Decreased balance, Decreased strength, Difficulty walking, Postural dysfunction  Visit Diagnosis: Muscle weakness (generalized)  History of falling  Unsteadiness on feet  Difficulty in walking, not elsewhere classified       G-Codes - 2015-09-12 0943    Functional Assessment Tool Used Pt subjective on bed mobility and walking    Functional Limitation Mobility: Walking and moving around   Mobility: Walking and Moving Around Current Status (P7921) At least 40 percent but less than 60 percent impaired, limited or  restricted   Mobility: Walking and Moving Around Goal Status [phone]) At least 40 percent but less than 60 percent impaired, limited or restricted      Problem List Patient Active Problem List   Diagnosis Date Noted  . Celiac disease 04/02/2015  . Diarrhea 03/05/2015  . Loss of weight 03/05/2015  . Hyperlipidemia 12/13/2014  . Vitamin D deficiency 12/13/2014  . Type 2 diabetes mellitus with stage 2 chronic kidney disease, with long-term current use of insulin (HCC) 12/06/2014  . Essential hypertension, benign 12/06/2014  . Primary hypothyroidism 12/06/2014  . OSA on CPAP 03/10/2013  . Severe obesity (BMI >= 40) (HCC) 10/11/2012  . Back pain 07/28/2012  . History of falling 01/29/2012  . Bilateral leg weakness 01/29/2012  . Difficulty walking 01/29/2012  . Abnormality of gait 01/09/2012  . Multiple sclerosis [facility]) 01/09/2012    Virgina Organ, PT CLT [phone] Sep 12, 2015, 9:48 AM  [facility] [address] [place], [place], [address] Phone: [phone]   Fax:  [phone]  Name: [name] MRN: [id number] Date of Birth:  11/18/1955     

## 2015-08-15 ENCOUNTER — Ambulatory Visit (HOSPITAL_COMMUNITY): Payer: Medicare Other | Admitting: Physical Therapy

## 2015-08-15 DIAGNOSIS — R2681 Unsteadiness on feet: Secondary | ICD-10-CM | POA: Diagnosis not present

## 2015-08-15 DIAGNOSIS — M6281 Muscle weakness (generalized): Secondary | ICD-10-CM | POA: Diagnosis not present

## 2015-08-15 DIAGNOSIS — Z9181 History of falling: Secondary | ICD-10-CM | POA: Diagnosis not present

## 2015-08-15 DIAGNOSIS — R262 Difficulty in walking, not elsewhere classified: Secondary | ICD-10-CM

## 2015-08-15 NOTE — Therapy (Signed)
[facility] [address] [place], [place], [address] Phone: [phone]   Fax:  [phone]  Physical Therapy Treatment  Patient Details  Name: [name] MRN: [id number] Date of Birth: 01/30/1955 Referring Provider: Asencion Partridge Dohmeier   Encounter Date: 08/15/2015      PT End of Session - 08/15/15 0942    Visit Number 10   Number of Visits 16   Date for PT Re-Evaluation 08/11/15   Authorization Type medicare    Authorization - Visit Number 10  g code done at visit 9   Authorization - Number of Visits 19   PT Start Time 0905   PT Stop Time 0945   PT Time Calculation (min) 40 min   Equipment Utilized During Treatment Gait belt   Activity Tolerance Patient limited by fatigue;Patient limited by pain  Rt hip catching  3x during  session   Behavior During Therapy [facility] for tasks assessed/performed      Past Medical History  Diagnosis Date  . Diabetes mellitus without complication ([place])   . Hypertension   . Morbid obesity ([place])   . High cholesterol   . MS (multiple sclerosis) (HCC) relapsing remitting  . Severe obesity (BMI >= 40) ([place]) 10/11/2012  . Hypothyroidism     Past Surgical History  Procedure Laterality Date  . Cholecystectomy  8/09  . Left oophorectomy Left 8/11  . Colonoscopy      about 7 years  . Colonoscopy with propofol N/A 03/19/2015    ONG:EXBMWU ileum and colon/small internal hemorrhoids  . Biopsy  03/19/2015    Procedure: BIOPSY;  Surgeon: Danie Binder, MD;  [facility];  Service: Endoscopy;;  random colon biopsy    There were no vitals filed for this visit.      Subjective Assessment - 08/15/15 0911    Subjective Pt states that she is exercising twice a day now.  She is having a bad day today due to the heat.  Electrical sensation thru LE is not as bad    How long can you sit comfortably? unable    How long can you stand comfortably? Pt is able to do her dishes and not have to sit down she  was having to sit down .    How long can you walk comfortably? Pt is able to walk with her rollator for 15 minutes at this time    Currently in Pain? No/denies                         [facility] Adult PT Treatment/Exercise - 08/15/15 0001    Exercises   Exercises Knee/Hip   Knee/Hip Exercises: Standing   Hip Abduction Stengthening;Both   Abduction Limitations green t-band   Forward Step Up Both;10 reps;Hand Hold: 1;Step Height: 4"  2# on RT    Knee/Hip Exercises: Seated   Sit to Sand 10 reps             Balance Exercises - 08/15/15 0927    Balance Exercises: Standing   Standing Eyes Opened Narrow base of support (BOS);Foam/compliant surface;5 reps  with head turns x2   Standing Eyes Closed 2 reps;30 secs   Tandem Stance Eyes open;Intermittent upper extremity support;3 reps   SLS Eyes open;3 reps   Rockerboard Anterior/posterior;Lateral  2 min, 2 UE A   Marching Limitations 10x5"  toe tapping on 6in step   Heel Raises Limitations 10   Toe Raise Limitations  1o             PT Short Term Goals - 08/13/15 0938    PT SHORT TERM GOAL #1   Title Pt core to be increased one grade to allow pt to be able to roll in bed without difficulty    Time 4   Period Weeks   Status Achieved   PT SHORT TERM GOAL #2   Title Pt strength to be increased one grade in bilateral LE to allow pt to ambulate without having to think about advancement of her leg    Time 4   Period Weeks   Status Partially Met  good days and bad days    PT SHORT TERM GOAL #3   Title Pt to b be able to stand for five minutes to be able to wash dishes at the sink    Time 4   Period Weeks   Status Achieved   PT SHORT TERM GOAL #4   Title Pt to be able to single leg stance for 8 seconds on both LE to decrease risk for falls.    Time 4   Period Weeks   Status Not Met           PT Long Term Goals - 08/13/15 0939    PT LONG TERM GOAL #1   Title Pt core strength to be incresed to allow pt  to ambulate in an upright position    Time 8   Period Weeks   Status On-going   PT LONG TERM GOAL #2   Title Pt bilateral lower extremity strength to be increased one grade to allow patient to walk with her rollator for 15 mintues to allow her to walk her sister dog, go to the shed    Time 8   Period Weeks   Status Not Met   PT LONG TERM GOAL #3   Title Pt to be able to stand for 10 minutes in order to make a small meal; ie sandwich    Time 8   Period Weeks   Status Achieved   PT LONG TERM GOAL #4   Title Pt to be able to single leg stance on both legs for 12 seconds or greater to allow pt to feel confident walking on uneven terrain    Time 8   Period Weeks   Status Not Met               Plan - 08/15/15 0944    Clinical Impression Statement Pt treatment concentrated on balance and strength with threapist facilitation for safety and proper technique.  Pt needed multiple rest breaks throughout treatment. added step ups to program    Rehab Potential Good   PT Frequency 2x / week   PT Duration 8 weeks   PT Treatment/Interventions ADLs/Self Care Home Management;Gait training;Functional mobility training;Therapeutic activities;Therapeutic exercise;Balance training;Neuromuscular re-education;Patient/family education   PT Next Visit Plan Progress closed chain strengthening and balance activities standing hip abduction/extension.        Patient will benefit from skilled therapeutic intervention in order to improve the following deficits and impairments:  Abnormal gait, Decreased activity tolerance, Decreased balance, Decreased strength, Difficulty walking, Postural dysfunction  Visit Diagnosis: Muscle weakness (generalized)  History of falling  Unsteadiness on feet  Difficulty in walking, not elsewhere classified     Problem List Patient Active Problem List   Diagnosis Date Noted  . Celiac disease 04/02/2015  . Diarrhea 03/05/2015  . Loss of weight 03/05/2015  .    Hyperlipidemia 12/13/2014  . Vitamin D deficiency 12/13/2014  . Type 2 diabetes mellitus with stage 2 chronic kidney disease, with long-term current use of insulin ([place]) 12/06/2014  . Essential hypertension, benign 12/06/2014  . Primary hypothyroidism 12/06/2014  . OSA on CPAP 03/10/2013  . Severe obesity (BMI >= 40) ([place]) 10/11/2012  . Back pain 07/28/2012  . History of falling 01/29/2012  . Bilateral leg weakness 01/29/2012  . Difficulty walking 01/29/2012  . Abnormality of gait 01/09/2012  . Multiple sclerosis [facility]) 01/09/2012   [name], PT CLT [phone] 08/15/2015, 9:47 AM  [place] [address] [place], [place], [address] Phone: [phone]   Fax:  [phone]  Name: LAURYN LIZARDI MRN: [id number] Date of Birth: [birth year]

## 2015-08-20 ENCOUNTER — Ambulatory Visit (HOSPITAL_COMMUNITY): Payer: Medicare Other | Admitting: Physical Therapy

## 2015-08-20 DIAGNOSIS — R2681 Unsteadiness on feet: Secondary | ICD-10-CM

## 2015-08-20 DIAGNOSIS — M6281 Muscle weakness (generalized): Secondary | ICD-10-CM

## 2015-08-20 DIAGNOSIS — Z9181 History of falling: Secondary | ICD-10-CM | POA: Diagnosis not present

## 2015-08-20 DIAGNOSIS — R262 Difficulty in walking, not elsewhere classified: Secondary | ICD-10-CM

## 2015-08-20 NOTE — Therapy (Signed)
[facility] [address] [place], [place], [address] Phone: [phone]   Fax:  [phone]  Physical Therapy Treatment  Patient Details  Name: [name] MRN: [id number] Date of Birth: 03-08-1955 Referring Provider: Asencion Partridge Dohmeier   Encounter Date: 08/20/2015      PT End of Session - 08/20/15 0953    Visit Number 11   Number of Visits 16   Date for PT Re-Evaluation 08/11/15   Authorization Type medicare    Authorization - Visit Number 11  g code done at visit 9   Authorization - Number of Visits 16   PT Start Time 0900   PT Stop Time 1000   PT Time Calculation (min) 60 min   Equipment Utilized During Treatment Gait belt   Activity Tolerance Patient limited by fatigue;Patient limited by pain  Rt hip catching  3x during  session   Behavior During Therapy [facility] for tasks assessed/performed      Past Medical History:  Diagnosis Date  . Diabetes mellitus without complication ([facility])   . High cholesterol   . Hypertension   . Hypothyroidism   . Morbid obesity ([place])   . MS (multiple sclerosis) (HCC) relapsing remitting  . Severe obesity (BMI >= 40) ([place]) 10/11/2012    Past Surgical History:  Procedure Laterality Date  . BIOPSY  03/19/2015   Procedure: BIOPSY;  Surgeon: Danie Binder, MD;  [facility];  Service: Endoscopy;;  random colon biopsy  . CHOLECYSTECTOMY  8/09  . COLONOSCOPY     about 7 years  . COLONOSCOPY WITH PROPOFOL N/A 03/19/2015   TSV:[id number] ileum and colon/small internal hemorrhoids  . LEFT OOPHORECTOMY Left 8/11    There were no vitals filed for this visit.      Subjective Assessment - 08/20/15 0917    Subjective Pt states that she feels the heat is getting to her;  Pt states that yesterday she felt a sharp pain in her rt hip that almost made her fall.  States that it only occurred for a few seconds.    How long can you sit comfortably? unable    How long can you stand comfortably? Pt is  able to do her dishes and not have to sit down she was having to sit down .    How long can you walk comfortably? Pt is able to walk with her rollator for 15 minutes at this time    Currently in Pain? No/denies                         [facility] Adult PT Treatment/Exercise - 08/20/15 0001      Lumbar Exercises: Aerobic   Stationary Bike --  nustep hills 2; level 2 x 10:00: did not count in tx time.     Lumbar Exercises: Seated   Sit to Stand 10 reps     Lumbar Exercises: Quadruped   Madcat/Old Horse --  shifting from arms to knees x 10   Straight Leg Raise 10 reps   Opposite Arm/Leg Raise --  clam x 10; tall kneel with no UE support x 2;   Opposite Arm/Leg Raise Limitations tall kneel walking x 2 reps on mat              Balance Exercises - 08/20/15 0949      Balance Exercises: Standing   Standing Eyes Opened Narrow base of support (BOS);Foam/compliant surface;5 reps  with head turns  x2   Marching Limitations 10x5"  toe tapping on 6in step             PT Short Term Goals - 08/20/15 0954      PT SHORT TERM GOAL #1   Title Pt core to be increased one grade to allow pt to be able to roll in bed without difficulty    Time 4   Period Weeks   Status Achieved     PT SHORT TERM GOAL #2   Title Pt strength to be increased one grade in bilateral LE to allow pt to ambulate without having to think about advancement of her leg    Time 4   Period Weeks   Status Partially Met  good days and bad days      PT SHORT TERM GOAL #3   Title Pt to b be able to stand for five minutes to be able to wash dishes at the sink    Time 4   Period Weeks   Status Achieved     PT SHORT TERM GOAL #4   Title Pt to be able to single leg stance for 8 seconds on both LE to decrease risk for falls.    Time 4   Period Weeks   Status Not Met           PT Long Term Goals - 08/20/15 0954      PT LONG TERM GOAL #1   Title Pt core strength to be incresed to allow pt to  ambulate in an upright position    Time 8   Period Weeks   Status On-going     PT LONG TERM GOAL #2   Title Pt bilateral lower extremity strength to be increased one grade to allow patient to walk with her rollator for 15 mintues to allow her to walk her sister dog, go to the shed    Time 8   Period Weeks   Status Not Met     PT LONG TERM GOAL #3   Title Pt to be able to stand for 10 minutes in order to make a small meal; ie sandwich    Time 8   Period Weeks   Status Achieved     PT LONG TERM GOAL #4   Title Pt to be able to single leg stance on both legs for 12 seconds or greater to allow pt to feel confident walking on uneven terrain    Time 8   Period Weeks   Status Not Met               Plan - 08/20/15 0953    Clinical Impression Statement Added quadriped clams; tall kneeling activities for increased balance and hip stability.    Rehab Potential Good   PT Frequency 2x / week   PT Duration 8 weeks   PT Treatment/Interventions ADLs/Self Care Home Management;Gait training;Functional mobility training;Therapeutic activities;Therapeutic exercise;Balance training;Neuromuscular re-education;Patient/family education   PT Next Visit Plan Progress closed chain strengthening and balance activities standing hip abduction/extension.        Patient will benefit from skilled therapeutic intervention in order to improve the following deficits and impairments:  Abnormal gait, Decreased activity tolerance, Decreased balance, Decreased strength, Difficulty walking, Postural dysfunction  Visit Diagnosis: History of falling  Unsteadiness on feet  Difficulty in walking, not elsewhere classified  Muscle weakness (generalized)     Problem List Patient Active Problem List   Diagnosis Date Noted  . Celiac disease 04/02/2015  .  Diarrhea 03/05/2015  . Loss of weight 03/05/2015  . Hyperlipidemia 12/13/2014  . Vitamin D deficiency 12/13/2014  . Type 2 diabetes mellitus with  stage 2 chronic kidney disease, with long-term current use of insulin ([place]) 12/06/2014  . Essential hypertension, benign 12/06/2014  . Primary hypothyroidism 12/06/2014  . OSA on CPAP 03/10/2013  . Severe obesity (BMI >= 40) ([place]) 10/11/2012  . Back pain 07/28/2012  . History of falling 01/29/2012  . Bilateral leg weakness 01/29/2012  . Difficulty walking 01/29/2012  . Abnormality of gait 01/09/2012  . Multiple sclerosis [facility]) 01/09/2012   [name], PT CLT [phone] 08/20/2015, 9:57 AM  [facility] [address] [place], [place], [address] Phone: [phone]   Fax:  [phone]  Name: DEVANY AJA MRN: [id number] Date of Birth: 04/12/1955

## 2015-08-22 ENCOUNTER — Ambulatory Visit (HOSPITAL_COMMUNITY): Payer: Medicare Other

## 2015-08-22 DIAGNOSIS — R2681 Unsteadiness on feet: Secondary | ICD-10-CM

## 2015-08-22 DIAGNOSIS — Z9181 History of falling: Secondary | ICD-10-CM

## 2015-08-22 DIAGNOSIS — M6281 Muscle weakness (generalized): Secondary | ICD-10-CM | POA: Diagnosis not present

## 2015-08-22 DIAGNOSIS — R262 Difficulty in walking, not elsewhere classified: Secondary | ICD-10-CM | POA: Diagnosis not present

## 2015-08-22 NOTE — Therapy (Signed)
[facility] [address] [place], [place], [address] Phone: [phone]   Fax:  [phone]  Physical Therapy Treatment  Patient Details  Name: [name] MRN: [id number] Date of Birth: April 16, 1955 Referring Provider: Asencion Partridge Dohmeier   Encounter Date: 08/22/2015      PT End of Session - 08/22/15 1003    Visit Number 12   Number of Visits 16   Authorization Type medicare    Authorization - Visit Number 12   Authorization - Number of Visits 16   PT Start Time 0903   PT Stop Time 0955   PT Time Calculation (min) 52 min   Equipment Utilized During Treatment Gait belt   Activity Tolerance Patient limited by fatigue;Patient tolerated treatment well   Behavior During Therapy [facility] for tasks assessed/performed      Past Medical History:  Diagnosis Date  . Diabetes mellitus without complication ([place])   . High cholesterol   . Hypertension   . Hypothyroidism   . Morbid obesity ([place])   . MS (multiple sclerosis) (HCC) relapsing remitting  . Severe obesity (BMI >= 40) ([place]) 10/11/2012    Past Surgical History:  Procedure Laterality Date  . BIOPSY  03/19/2015   Procedure: BIOPSY;  Surgeon: Danie Binder, MD;  [facility];  Service: Endoscopy;;  random colon biopsy  . CHOLECYSTECTOMY  8/09  . COLONOSCOPY     about 7 years  . COLONOSCOPY WITH PROPOFOL N/A 03/19/2015   EZM:OQHUTM ileum and colon/small internal hemorrhoids  . LEFT OOPHORECTOMY Left 8/11    There were no vitals filed for this visit.      Subjective Assessment - 08/22/15 0911    Subjective Pt stated she was sore Lt knee following last session.  Reports she feels a catch in her hips both sides sometimes while walking   Pertinent History MS diagnosed in 1989, DM,    Currently in Pain? Yes   Pain Score 5    Pain Location Knee   Pain Orientation Left   Pain Descriptors / Indicators Sore   Pain Type Chronic pain   Pain Onset 1 to 4 weeks ago   Pain  Frequency Intermittent   Aggravating Factors  standing and gait   Pain Relieving Factors exercises   Effect of Pain on Daily Activities minimal effects with ADLs                         [facility] Adult PT Treatment/Exercise - 08/22/15 0001      Lumbar Exercises: Quadruped   Single Arm Raise 10 reps   Opposite Arm/Leg Raise Limitations tall kneeling UE Flexion 10x     Knee/Hip Exercises: Aerobic   Nustep L2 hill with UE/LE x 10 min at EOS unsupervised no charge     Knee/Hip Exercises: Standing   Hip Abduction Stengthening;Both   Abduction Limitations green t-band   Forward Step Up Both;10 reps;Hand Hold: 1;Step Height: 4"     Knee/Hip Exercises: Seated   Sit to Sand 10 reps             Balance Exercises - 08/22/15 0930      Balance Exercises: Standing   Rockerboard Anterior/posterior;Lateral  31mn each UE A   Sidestepping 4 reps  4 RT with decreased UE A each set to no HHA infront of mat    Marching Limitations 10x5"             PT Short  Term Goals - 08/20/15 0954      PT SHORT TERM GOAL #1   Title Pt core to be increased one grade to allow pt to be able to roll in bed without difficulty    Time 4   Period Weeks   Status Achieved     PT SHORT TERM GOAL #2   Title Pt strength to be increased one grade in bilateral LE to allow pt to ambulate without having to think about advancement of her leg    Time 4   Period Weeks   Status Partially Met  good days and bad days      PT SHORT TERM GOAL #3   Title Pt to b be able to stand for five minutes to be able to wash dishes at the sink    Time 4   Period Weeks   Status Achieved     PT SHORT TERM GOAL #4   Title Pt to be able to single leg stance for 8 seconds on both LE to decrease risk for falls.    Time 4   Period Weeks   Status Not Met           PT Long Term Goals - 08/20/15 0954      PT LONG TERM GOAL #1   Title Pt core strength to be incresed to allow pt to ambulate in an upright  position    Time 8   Period Weeks   Status On-going     PT LONG TERM GOAL #2   Title Pt bilateral lower extremity strength to be increased one grade to allow patient to walk with her rollator for 15 mintues to allow her to walk her sister dog, go to the shed    Time 8   Period Weeks   Status Not Met     PT LONG TERM GOAL #3   Title Pt to be able to stand for 10 minutes in order to make a small meal; ie sandwich    Time 8   Period Weeks   Status Achieved     PT LONG TERM GOAL #4   Title Pt to be able to single leg stance on both legs for 12 seconds or greater to allow pt to feel confident walking on uneven terrain    Time 8   Period Weeks   Status Not Met               Plan - 08/22/15 1433    Clinical Impression Statement Session focus on improving core and hip musculature strengthening to improve balance and hip stability with therapist facilitation for proper form, technique and safety.  Added UE flexion for core strengthening in quadruped and tall kneeling for balance training.  Reports of pain reduced Bil knees at EOS.     Rehab Potential Good   PT Frequency 2x / week   PT Duration 8 weeks   PT Treatment/Interventions ADLs/Self Care Home Management;Gait training;Functional mobility training;Therapeutic activities;Therapeutic exercise;Balance training;Neuromuscular re-education;Patient/family education   PT Next Visit Plan Progress closed chain strengthening and balance activities standing hip abduction/extension.        Patient will benefit from skilled therapeutic intervention in order to improve the following deficits and impairments:  Abnormal gait, Decreased activity tolerance, Decreased balance, Decreased strength, Difficulty walking, Postural dysfunction  Visit Diagnosis: History of falling  Unsteadiness on feet  Difficulty in walking, not elsewhere classified  Muscle weakness (generalized)     Problem List Patient Active Problem  List   Diagnosis  Date Noted  . Celiac disease 04/02/2015  . Diarrhea 03/05/2015  . Loss of weight 03/05/2015  . Hyperlipidemia 12/13/2014  . Vitamin D deficiency 12/13/2014  . Type 2 diabetes mellitus with stage 2 chronic kidney disease, with long-term current use of insulin ([place]) 12/06/2014  . Essential hypertension, benign 12/06/2014  . Primary hypothyroidism 12/06/2014  . OSA on CPAP 03/10/2013  . Severe obesity (BMI >= 40) ([place]) 10/11/2012  . Back pain 07/28/2012  . History of falling 01/29/2012  . Bilateral leg weakness 01/29/2012  . Difficulty walking 01/29/2012  . Abnormality of gait 01/09/2012  . Multiple sclerosis ([place]) 01/09/2012   Ihor Austin, [place]; [place]  Aldona Lento 08/22/2015, 2:36 PM  [facility] [address] [place], [place], [address] Phone: [phone]   Fax:  [phone]  Name: [name] MRN: [id number] Date of Birth: August 22, 1955

## 2015-08-26 ENCOUNTER — Other Ambulatory Visit: Payer: Self-pay | Admitting: Neurology

## 2015-08-27 ENCOUNTER — Ambulatory Visit (HOSPITAL_COMMUNITY): Payer: Medicare Other | Attending: Neurology | Admitting: Physical Therapy

## 2015-08-27 DIAGNOSIS — M6281 Muscle weakness (generalized): Secondary | ICD-10-CM | POA: Insufficient documentation

## 2015-08-27 DIAGNOSIS — R2681 Unsteadiness on feet: Secondary | ICD-10-CM | POA: Insufficient documentation

## 2015-08-27 DIAGNOSIS — Z9181 History of falling: Secondary | ICD-10-CM | POA: Insufficient documentation

## 2015-08-27 DIAGNOSIS — R262 Difficulty in walking, not elsewhere classified: Secondary | ICD-10-CM | POA: Insufficient documentation

## 2015-08-27 NOTE — Patient Instructions (Addendum)
Opposite Arm / Leg Lift (Prone)    Abdomen and head supported, left knee locked, raise leg and opposite arm _2___ inches from floor. Repeat 10____ times per set. Do _1___ sets per session. Do _2___ sessions per day.  http://orth.exer.us/1114   Copyright  VHI. All rights reserved.  Forward Lean (All-Fours)    Slowly lean forward, keeping stomach tight. Keep trunk rigid. Repeat __10__ times per set. Do __1__ sets per session. Do __1__ sessions per day.  http://orth.exer.us/1120   Copyright  VHI. All rights reserved.  Upper / Lower Extremity Extension (All-Fours)    Tighten stomach and raise right leg and opposite arm. Keep trunk rigid. Repeat _5___ times per set. Do _2__ sets per session. Do __1__ sessions per day.  http://orth.exer.us/1118   Copyright  VHI. All rights reserved.  Combination (Hook-Lying)    Tighten stomach and slowly raise left leg and lower opposite arm over head. Keep trunk rigid. Repeat 10____ times per set. Do __1__ sets per session. Do 2____ sessions per day.  http://orth.exer.us/1092   Copyright  VHI. All rights reserved.

## 2015-08-27 NOTE — Therapy (Signed)
[facility] [address] [place], [place], [address] Phone: [phone]   Fax:  [phone]  Physical Therapy Treatment  Patient Details  Name: [name] MRN: [id number] Date of Birth: 03/07/1955 Referring Provider: Asencion Partridge Dohmeier   Encounter Date: 08/27/2015      PT End of Session - 08/27/15 0937    Visit Number 13   Number of Visits 16   Authorization Type medicare    Authorization - Visit Number 13   Authorization - Number of Visits 16   PT Start Time 0903   PT Stop Time 0945   PT Time Calculation (min) 42 min   Equipment Utilized During Treatment Gait belt   Activity Tolerance Patient limited by fatigue;Patient tolerated treatment well   Behavior During Therapy [facility] for tasks assessed/performed      Past Medical History:  Diagnosis Date  . Diabetes mellitus without complication ([place])   . High cholesterol   . Hypertension   . Hypothyroidism   . Morbid obesity ([place])   . MS (multiple sclerosis) (HCC) relapsing remitting  . Severe obesity (BMI >= 40) ([place]) 10/11/2012    Past Surgical History:  Procedure Laterality Date  . BIOPSY  03/19/2015   Procedure: BIOPSY;  Surgeon: Danie Binder, MD;  [facility];  Service: Endoscopy;;  random colon biopsy  . CHOLECYSTECTOMY  8/09  . COLONOSCOPY     about 7 years  . COLONOSCOPY WITH PROPOFOL N/A 03/19/2015   FUX:NATFTD ileum and colon/small internal hemorrhoids  . LEFT OOPHORECTOMY Left 8/11    There were no vitals filed for this visit.      Subjective Assessment - 08/27/15 0924    Subjective Pt stated she was sore Lt knee following last session.  Reports she feels a catch in her hips both sides sometimes while walking   Pertinent History MS diagnosed in 1989, DM,    Currently in Pain? No/denies   Pain Onset 1 to 4 weeks ago                         [facility] Adult PT Treatment/Exercise - 08/27/15 0001      Lumbar Exercises: Supine   Other  Supine Lumbar Exercises dead bug x 10      Lumbar Exercises: Prone   Opposite Arm/Leg Raise Right arm/Left leg;Left arm/Right leg;10 reps     Lumbar Exercises: Quadruped   Madcat/Old Horse 10 reps   Single Arm Raise 10 reps   Straight Leg Raise 10 reps   Opposite Arm/Leg Raise Limitations tall kneeling walking x 2 sRT    Plank weight shifting from arms to legs; crawling x 3 reps      Knee/Hip Exercises: Aerobic   Nustep L2 hill with UE/LE x 10 min at EOS unsupervised no charge                PT Education - 08/27/15 0936    Education provided Yes   Education Details HEP   Person(s) Educated Patient   Methods Explanation;Verbal cues;Tactile cues;Handout   Comprehension Verbalized understanding;Returned demonstration          PT Short Term Goals - 08/27/15 0939      PT SHORT TERM GOAL #1   Title Pt core to be increased one grade to allow pt to be able to roll in bed without difficulty    Time 4   Period Weeks   Status Achieved  PT SHORT TERM GOAL #2   Title Pt strength to be increased one grade in bilateral LE to allow pt to ambulate without having to think about advancement of her leg    Time 4   Period Weeks   Status Partially Met  good days and bad days      PT SHORT TERM GOAL #3   Title Pt to b be able to stand for five minutes to be able to wash dishes at the sink    Time 4   Period Weeks   Status Achieved     PT SHORT TERM GOAL #4   Title Pt to be able to single leg stance for 8 seconds on both LE to decrease risk for falls.    Time 4   Period Weeks   Status Not Met           PT Long Term Goals - 08/27/15 0939      PT LONG TERM GOAL #1   Title Pt core strength to be incresed to allow pt to ambulate in an upright position    Time 8   Period Weeks   Status On-going     PT LONG TERM GOAL #2   Title Pt bilateral lower extremity strength to be increased one grade to allow patient to walk with her rollator for 15 mintues to allow her to  walk her sister dog, go to the shed    Time 8   Period Weeks   Status Not Met     PT LONG TERM GOAL #3   Title Pt to be able to stand for 10 minutes in order to make a small meal; ie sandwich    Time 8   Period Weeks   Status Achieved     PT LONG TERM GOAL #4   Title Pt to be able to single leg stance on both legs for 12 seconds or greater to allow pt to feel confident walking on uneven terrain    Time 8   Period Weeks   Status Not Met               Plan - 08/27/15 0937    Clinical Impression Statement Added opposite arm leg and mad cat in quadriped; opposite arm leg in prone and given as a HEP to work on core and LE strength as well as coordination    Rehab Potential Good   PT Frequency 2x / week   PT Duration 8 weeks   PT Treatment/Interventions ADLs/Self Care Home Management;Gait training;Functional mobility training;Therapeutic activities;Therapeutic exercise;Balance training;Neuromuscular re-education;Patient/family education   PT Next Visit Plan Progress closed chain strengthening and balance activities standing hip abduction/extension.        Patient will benefit from skilled therapeutic intervention in order to improve the following deficits and impairments:  Abnormal gait, Decreased activity tolerance, Decreased balance, Decreased strength, Difficulty walking, Postural dysfunction  Visit Diagnosis: History of falling  Unsteadiness on feet  Difficulty in walking, not elsewhere classified  Muscle weakness (generalized)     Problem List Patient Active Problem List   Diagnosis Date Noted  . Celiac disease 04/02/2015  . Diarrhea 03/05/2015  . Loss of weight 03/05/2015  . Hyperlipidemia 12/13/2014  . Vitamin D deficiency 12/13/2014  . Type 2 diabetes mellitus with stage 2 chronic kidney disease, with long-term current use of insulin ([place]) 12/06/2014  . Essential hypertension, benign 12/06/2014  . Primary hypothyroidism 12/06/2014  . OSA on CPAP  03/10/2013  . Severe obesity (  BMI >= 40) ([place]) 10/11/2012  . Back pain 07/28/2012  . History of falling 01/29/2012  . Bilateral leg weakness 01/29/2012  . Difficulty walking 01/29/2012  . Abnormality of gait 01/09/2012  . Multiple sclerosis [facility]) 01/09/2012    [name], PT CLT [phone] 08/27/2015, 9:47 AM  [facility] [address] [place], [place], [address] Phone: [phone]   Fax:  [phone]  Name: TANESHIA LORENCE MRN: [id number] Date of Birth: [birth year]

## 2015-08-28 ENCOUNTER — Ambulatory Visit (INDEPENDENT_AMBULATORY_CARE_PROVIDER_SITE_OTHER): Payer: Medicare Other | Admitting: Urology

## 2015-08-28 ENCOUNTER — Other Ambulatory Visit (HOSPITAL_COMMUNITY)
Admission: RE | Admit: 2015-08-28 | Discharge: 2015-08-28 | Disposition: A | Discharge status: Home / Self Care | Payer: Medicare Other | Source: Ambulatory Visit | Attending: Urology | Admitting: Urology

## 2015-08-28 DIAGNOSIS — N3020 Other chronic cystitis without hematuria: Secondary | ICD-10-CM | POA: Diagnosis not present

## 2015-08-28 DIAGNOSIS — R339 Retention of urine, unspecified: Secondary | ICD-10-CM | POA: Diagnosis not present

## 2015-08-28 DIAGNOSIS — R338 Other retention of urine: Secondary | ICD-10-CM | POA: Diagnosis not present

## 2015-08-28 DIAGNOSIS — N312 Flaccid neuropathic bladder, not elsewhere classified: Secondary | ICD-10-CM

## 2015-08-29 ENCOUNTER — Ambulatory Visit (HOSPITAL_COMMUNITY): Payer: Medicare Other | Admitting: Physical Therapy

## 2015-08-29 DIAGNOSIS — M6281 Muscle weakness (generalized): Secondary | ICD-10-CM

## 2015-08-29 DIAGNOSIS — R2681 Unsteadiness on feet: Secondary | ICD-10-CM

## 2015-08-29 DIAGNOSIS — Z9181 History of falling: Secondary | ICD-10-CM

## 2015-08-29 DIAGNOSIS — R262 Difficulty in walking, not elsewhere classified: Secondary | ICD-10-CM

## 2015-08-29 NOTE — Therapy (Signed)
[name] [address] [place], [place], [address] Phone: [phone]   Fax:  [phone]  Physical Therapy Treatment  Patient Details  Name: [name] MRN: [id number] Date of Birth: Aug 18, 1955 Referring Provider: Asencion Partridge Dohmeier   Encounter Date: 08/29/2015      PT End of Session - 08/29/15 0930    Visit Number 14   Number of Visits 16   Authorization Type medicare    Authorization - Visit Number 14   Authorization - Number of Visits 16   PT Start Time 0845   PT Stop Time 0925   PT Time Calculation (min) 40 min   Equipment Utilized During Treatment Gait belt   Activity Tolerance Patient limited by fatigue;Patient tolerated treatment well   Behavior During Therapy [name] for tasks assessed/performed      Past Medical History:  Diagnosis Date  . Diabetes mellitus without complication ([place])   . High cholesterol   . Hypertension   . Hypothyroidism   . Morbid obesity ([place])   . MS (multiple sclerosis) (HCC) relapsing remitting  . Severe obesity (BMI >= 40) ([place]) 10/11/2012    Past Surgical History:  Procedure Laterality Date  . BIOPSY  03/19/2015   Procedure: BIOPSY;  Surgeon: Margaret Binder, MD;  [facility];  Service: Endoscopy;;  random colon biopsy  . CHOLECYSTECTOMY  8/09  . COLONOSCOPY     about 7 years  . COLONOSCOPY WITH PROPOFOL N/A 03/19/2015   ONG:EXBMWU ileum and colon/small internal hemorrhoids  . LEFT OOPHORECTOMY Left 8/11    There were no vitals filed for this visit.      Subjective Assessment - 08/29/15 0902    Subjective Pt states she feels that she is walking better.  She feels she will be ready for discharge next week.    Pertinent History MS diagnosed in 1989, DM,    Currently in Pain? No/denies   Pain Onset 1 to 4 weeks ago                         [name] Adult PT Treatment/Exercise - 08/29/15 0001      Lumbar Exercises: Stretches   Quadruped Mid Back Stretch 5  reps     Lumbar Exercises: Standing   Other Standing Lumbar Exercises (P)  marching x 10      Lumbar Exercises: Seated   Sit to Stand (P)  10 reps     Lumbar Exercises: Supine   Other Supine Lumbar Exercises dead bug x 10      Lumbar Exercises: Prone   Opposite Arm/Leg Raise (P)  Right arm/Left leg;Left arm/Right leg;20 reps     Lumbar Exercises: Quadruped   Madcat/Old Horse 10 reps   Single Arm Raise 10 reps   Straight Leg Raise 10 reps   Opposite Arm/Leg Raise Limitations tall kneeling balance    Plank fire hydrant, weight shifting from arms to legs; crawling x 5 reps      Knee/Hip Exercises: Aerobic                     PT Short Term Goals - 08/29/15 1324      PT SHORT TERM GOAL #1   Title Pt core to be increased one grade to allow pt to be able to roll in bed without difficulty    Time 4   Period Weeks   Status Achieved     PT SHORT TERM  GOAL #2   Title Pt strength to be increased one grade in bilateral LE to allow pt to ambulate without having to think about advancement of her leg    Time 4   Period Weeks   Status Partially Met  good days and bad days      PT SHORT TERM GOAL #3   Title Pt to b be able to stand for five minutes to be able to wash dishes at the sink    Time 4   Period Weeks   Status Achieved     PT SHORT TERM GOAL #4   Title Pt to be able to single leg stance for 8 seconds on both LE to decrease risk for falls.    Time 4   Period Weeks   Status Not Met           PT Long Term Goals - 08/29/15 0929      PT LONG TERM GOAL #1   Title Pt core strength to be incresed to allow pt to ambulate in an upright position    Time 8   Period Weeks   Status On-going     PT LONG TERM GOAL #2   Title Pt bilateral lower extremity strength to be increased one grade to allow patient to walk with her rollator for 15 mintues to allow her to walk her sister dog, go to the shed    Time 8   Period Weeks   Status Not Met     PT LONG TERM GOAL  #3   Title Pt to be able to stand for 10 minutes in order to make a small meal; ie sandwich    Time 8   Period Weeks   Status Achieved     PT LONG TERM GOAL #4   Title Pt to be able to single leg stance on both legs for 12 seconds or greater to allow pt to feel confident walking on uneven terrain    Time 8   Period Weeks   Status Not Met               Plan - 08/29/15 0926    Clinical Impression Statement Added hip abdcution while in quadriped and tall kneeling balance to progress both balance and hip stability.  Pt has two more visits and then will be ready for discharge.    Rehab Potential Good   PT Frequency 2x / week   PT Duration 8 weeks   PT Treatment/Interventions ADLs/Self Care Home Management;Gait training;Functional mobility training;Therapeutic activities;Therapeutic exercise;Balance training;Neuromuscular re-education;Patient/family education   PT Next Visit Plan Progress closed chain strengthening and balance activities standing hip abduction/extension.  Reassess next Wednesday; 09/04/2015      Patient will benefit from skilled therapeutic intervention in order to improve the following deficits and impairments:  Abnormal gait, Decreased activity tolerance, Decreased balance, Decreased strength, Difficulty walking, Postural dysfunction  Visit Diagnosis: History of falling  Unsteadiness on feet  Difficulty in walking, not elsewhere classified  Muscle weakness (generalized)     Problem List Patient Active Problem List   Diagnosis Date Noted  . Celiac disease 04/02/2015  . Diarrhea 03/05/2015  . Loss of weight 03/05/2015  . Hyperlipidemia 12/13/2014  . Vitamin D deficiency 12/13/2014  . Type 2 diabetes mellitus with stage 2 chronic kidney disease, with long-term current use of insulin ([place]) 12/06/2014  . Essential hypertension, benign 12/06/2014  . Primary hypothyroidism 12/06/2014  . OSA on CPAP 03/10/2013  . Severe obesity (BMI >=  40) ([place]) 10/11/2012   . Back pain 07/28/2012  . History of falling 01/29/2012  . Bilateral leg weakness 01/29/2012  . Difficulty walking 01/29/2012  . Abnormality of gait 01/09/2012  . Multiple sclerosis [facility]) 01/09/2012    [name], PT CLT [phone] 08/29/2015, 9:42 AM  [facility] [address] [place], [place], [address] Phone: [phone]   Fax:  [phone]  Name: DIANIA CO MRN: [id number] Date of Birth: August 28, 1955

## 2015-08-30 LAB — URINE CULTURE: Culture: >=100000 — AB

## 2015-09-02 ENCOUNTER — Ambulatory Visit (HOSPITAL_COMMUNITY): Payer: Medicare Other | Admitting: Physical Therapy

## 2015-09-02 DIAGNOSIS — Z9181 History of falling: Secondary | ICD-10-CM

## 2015-09-02 DIAGNOSIS — R262 Difficulty in walking, not elsewhere classified: Secondary | ICD-10-CM | POA: Diagnosis not present

## 2015-09-02 DIAGNOSIS — R2681 Unsteadiness on feet: Secondary | ICD-10-CM

## 2015-09-02 DIAGNOSIS — M6281 Muscle weakness (generalized): Secondary | ICD-10-CM

## 2015-09-02 NOTE — Therapy (Signed)
[facility] [address] [place], [place], [address] Phone: [phone]   Fax:  [phone]  Physical Therapy Treatment  Patient Details  Name: [name] MRN: [id number] Date of Birth: 1955-10-06 Referring Provider: Asencion Partridge Dohmeier   Encounter Date: 09/02/2015      PT End of Session - 09/02/15 0830    Visit Number 15   Number of Visits 16   Authorization Type medicare :  cert until 3/22   Authorization - Visit Number 15   Authorization - Number of Visits 16   PT Start Time 0815   PT Stop Time 0855   PT Time Calculation (min) 40 min   Activity Tolerance Patient limited by fatigue;Patient tolerated treatment well   Behavior During Therapy [facility] for tasks assessed/performed      Past Medical History:  Diagnosis Date  . Diabetes mellitus without complication ([place])   . High cholesterol   . Hypertension   . Hypothyroidism   . Morbid obesity ([place])   . MS (multiple sclerosis) (HCC) relapsing remitting  . Severe obesity (BMI >= 40) ([place]) 10/11/2012    Past Surgical History:  Procedure Laterality Date  . BIOPSY  03/19/2015   Procedure: BIOPSY;  Surgeon: Danie Binder, MD;  [facility];  Service: Endoscopy;;  random colon biopsy  . CHOLECYSTECTOMY  8/09  . COLONOSCOPY     about 7 years  . COLONOSCOPY WITH PROPOFOL N/A 03/19/2015   GUR:KYHCWC ileum and colon/small internal hemorrhoids  . LEFT OOPHORECTOMY Left 8/11    There were no vitals filed for this visit.      Subjective Assessment - 09/02/15 0823    Subjective Pt is doing her home exercise program.  She feels comfortable being discharged next visit.     Pertinent History MS diagnosed in 1989, DM,    How long can you sit comfortably? no problem    How long can you stand comfortably? able to stand long enough to do her dishes now was having to sit down.    How long can you walk comfortably? Pt walks with a rollator to walk the dog and is able to go for 5-10  mintues without having to sit down    Currently in Pain? No/denies   Pain Onset 1 to 4 weeks ago                         [facility] Adult PT Treatment/Exercise - 09/02/15 0001      Lumbar Exercises: Stretches   Quadruped Mid Back Stretch 5 reps     Lumbar Exercises: Standing   Other Standing Lumbar Exercises marching x 10      Lumbar Exercises: Supine   Large Ball Abdominal Isometric 15 reps   Large Ball Oblique Isometric 10 reps   Other Supine Lumbar Exercises dead bug x 10      Lumbar Exercises: Prone   Opposite Arm/Leg Raise Right arm/Left leg;Left arm/Right leg;20 reps     Lumbar Exercises: Quadruped   Madcat/Old Horse 10 reps   Single Arm Raise 10 reps   Straight Leg Raise 10 reps   Opposite Arm/Leg Raise Limitations tall kneeling balance    Plank fire hydrant, weight shifting from arms to legs; crawling x 5 reps      Knee/Hip Exercises: Aerobic   Nustep L2 hill with UE/LE x 10 min at EOS unsupervised no charge  PT Education - 09/02/15 0829    Education provided Yes   Education Details abdominal ball isometric exercises    Person(s) Educated Patient   Methods Explanation   Comprehension Verbalized understanding;Returned demonstration          PT Short Term Goals - 09/02/15 0831      PT SHORT TERM GOAL #1   Title Pt core to be increased one grade to allow pt to be able to roll in bed without difficulty    Time 4   Period Weeks   Status Achieved     PT SHORT TERM GOAL #2   Title Pt strength to be increased one grade in bilateral LE to allow pt to ambulate without having to think about advancement of her leg    Time 4   Period Weeks   Status Partially Met  good days and bad days      PT SHORT TERM GOAL #3   Title Pt to b be able to stand for five minutes to be able to wash dishes at the sink    Time 4   Period Weeks   Status Achieved     PT SHORT TERM GOAL #4   Title Pt to be able to single leg stance for 8 seconds  on both LE to decrease risk for falls.    Time 4   Period Weeks   Status Not Met           PT Long Term Goals - 09/02/15 6333      PT LONG TERM GOAL #1   Title Pt core strength to be incresed to allow pt to ambulate in an upright position    Time 8   Period Weeks   Status On-going     PT LONG TERM GOAL #2   Title Pt bilateral lower extremity strength to be increased one grade to allow patient to walk with her rollator for 15 mintues to allow her to walk her sister dog, go to the shed    Time 8   Period Weeks   Status Not Met     PT LONG TERM GOAL #3   Title Pt to be able to stand for 10 minutes in order to make a small meal; ie sandwich    Time 8   Period Weeks   Status Achieved     PT LONG TERM GOAL #4   Title Pt to be able to single leg stance on both legs for 12 seconds or greater to allow pt to feel confident walking on uneven terrain    Time 8   Period Weeks   Status Not Met               Plan - 09/02/15 0830    Clinical Impression Statement Add ball for isometric abdominal for rectus and oblique mm.     Rehab Potential Good   PT Frequency 2x / week   PT Duration 8 weeks   PT Treatment/Interventions ADLs/Self Care Home Management;Gait training;Functional mobility training;Therapeutic activities;Therapeutic exercise;Balance training;Neuromuscular re-education;Patient/family education   PT Next Visit Plan Reassess next treatment for discharge.       Patient will benefit from skilled therapeutic intervention in order to improve the following deficits and impairments:  Abnormal gait, Decreased activity tolerance, Decreased balance, Decreased strength, Difficulty walking, Postural dysfunction  Visit Diagnosis: History of falling  Unsteadiness on feet  Difficulty in walking, not elsewhere classified  Muscle weakness (generalized)     Problem List Patient  Active Problem List   Diagnosis Date Noted  . Celiac disease 04/02/2015  . Diarrhea  03/05/2015  . Loss of weight 03/05/2015  . Hyperlipidemia 12/13/2014  . Vitamin D deficiency 12/13/2014  . Type 2 diabetes mellitus with stage 2 chronic kidney disease, with long-term current use of insulin ([place]) 12/06/2014  . Essential hypertension, benign 12/06/2014  . Primary hypothyroidism 12/06/2014  . OSA on CPAP 03/10/2013  . Severe obesity (BMI >= 40) ([place]) 10/11/2012  . Back pain 07/28/2012  . History of falling 01/29/2012  . Bilateral leg weakness 01/29/2012  . Difficulty walking 01/29/2012  . Abnormality of gait 01/09/2012  . Multiple sclerosis [facility]) 01/09/2012    [name], PT CLT [phone] 09/02/2015, 8:56 AM  [facility] [address] [place], [place], [address] Phone: [phone]   Fax:  [phone]  Name: JENALYN GIRDNER MRN: [id number] Date of Birth: Dec 22, 1955

## 2015-09-04 ENCOUNTER — Ambulatory Visit (HOSPITAL_COMMUNITY): Payer: Medicare Other | Admitting: Physical Therapy

## 2015-09-04 DIAGNOSIS — R2681 Unsteadiness on feet: Secondary | ICD-10-CM | POA: Diagnosis not present

## 2015-09-04 DIAGNOSIS — R262 Difficulty in walking, not elsewhere classified: Secondary | ICD-10-CM | POA: Diagnosis not present

## 2015-09-04 DIAGNOSIS — Z9181 History of falling: Secondary | ICD-10-CM | POA: Diagnosis not present

## 2015-09-04 DIAGNOSIS — M6281 Muscle weakness (generalized): Secondary | ICD-10-CM | POA: Diagnosis not present

## 2015-09-04 NOTE — Progress Notes (Signed)
I agree with the assessment and plan as directed by PT.The patient is known to me . Send copy to Dr Sherril Croon in [place] please.    Blessed Girdner, MD

## 2015-09-04 NOTE — Therapy (Signed)
[facility] [address] [place], [place], [address] Phone: [phone]   Fax:  [phone]  Physical Therapy Treatment (Discharge)  Patient Details  Name: [name] MRN: [id number] Date of Birth: [birth year] Referring Provider: Asencion Partridge Dohmeier   Encounter Date: 09/04/2015      PT End of Session - 09/04/15 0849    Visit Number 16   Number of Visits 16   Authorization Type medicare :  cert until 6/83   Authorization - Visit Number 16   Authorization - Number of Visits 16   PT Start Time 0816   PT Stop Time 0847   PT Time Calculation (min) 31 min   Activity Tolerance Patient tolerated treatment well   Behavior During Therapy [facility] for tasks assessed/performed      Past Medical History:  Diagnosis Date  . Diabetes mellitus without complication ([place])   . High cholesterol   . Hypertension   . Hypothyroidism   . Morbid obesity ([place])   . MS (multiple sclerosis) (HCC) relapsing remitting  . Severe obesity (BMI >= 40) ([place]) 10/11/2012    Past Surgical History:  Procedure Laterality Date  . BIOPSY  03/19/2015   Procedure: BIOPSY;  Surgeon: Danie Binder, MD;  [facility];  Service: Endoscopy;;  random colon biopsy  . CHOLECYSTECTOMY  8/09  . COLONOSCOPY     about 7 years  . COLONOSCOPY WITH PROPOFOL N/A 03/19/2015   MHD:QQIWLN ileum and colon/small internal hemorrhoids  . LEFT OOPHORECTOMY Left 8/11    There were no vitals filed for this visit.      Subjective Assessment - 09/04/15 0818    Subjective Patient arrives stating that she is doing well, she is focusing on her balance but feels like she still needs to strengthen her hip area as well. She has also started a probiotic. She would put herself as a 40/100 on a subjective scale, with balance and strength being her biggest complaints. She has kept up with her HEP well, and states that she has also tried to work on some crawling at home, as well as high kneeling  tasks which she has been doing on her bed. No falls or close calls recently.    Pertinent History MS diagnosed in 1989, DM,    How long can you sit comfortably? no problem    How long can you stand comfortably? able to stand long enough to do her dishes now was having to sit down.    How long can you walk comfortably? Pt walks with a rollator to walk the dog and is able to go for 5-10 mintues without having to sit down    Currently in Pain? No/denies            [facility] PT Assessment - 09/04/15 0001      Strength   Right Hip Flexion 2/5   Right Hip Extension 3-/5   Right Hip ABduction 2/5   Left Hip Flexion 3-/5   Left Hip Extension 3/5   Left Hip ABduction 2/5   Right Knee Flexion 3/5   Right Knee Extension 4/5   Left Knee Flexion 3/5   Left Knee Extension 4+/5   Right Ankle Dorsiflexion 5/5   Left Ankle Dorsiflexion 5/5     6 minute walk test results    Aerobic Endurance Distance Walked 251   Endurance additional comments 3MWT, rollator     High Level Balance   High Level Balance Comments TUG 26.18  with rollator; SLS 2 second max                              PT Education - 09/04/15 0849    Education provided Yes   Education Details progress with skilled PT services, referrals to [facility] for water exercise and local walking club, importance of monitoring and appropriately responding to fatigue in MS especially with exercise    Person(s) Educated Patient   Methods Explanation   Comprehension Verbalized understanding          PT Short Term Goals - 09/04/15 0836      PT SHORT TERM GOAL #1   Title Pt core to be increased one grade to allow pt to be able to roll in bed without difficulty    Time 4   Period Weeks   Status Achieved     PT SHORT TERM GOAL #2   Title Pt strength to be increased one grade in bilateral LE to allow pt to ambulate without having to think about advancement of her leg    Baseline 8/9- this is easier in the morning but harder  as she fatigues during the day    Time 4   Period Weeks   Status Partially Met     PT SHORT TERM GOAL #3   Title Pt to b be able to stand for five minutes to be able to wash dishes at the sink    Time 4   Period Weeks   Status Achieved     PT SHORT TERM GOAL #4   Title Pt to be able to single leg stance for 8 seconds on both LE to decrease risk for falls.    Baseline 8/9- 2 seconds best    Time 4   Period Weeks   Status Not Met           PT Long Term Goals - 09/04/15 0981      PT LONG TERM GOAL #1   Title Pt core strength to be incresed to allow pt to ambulate in an upright position    Baseline 8/9- continues to ambulate with flexed trunk    Time 8   Period Weeks   Status Not Met     PT LONG TERM GOAL #2   Title Pt bilateral lower extremity strength to be increased one grade to allow patient to walk with her rollator for 15 mintues to allow her to walk her sister dog, go to the shed    Baseline 8/9- able to walk at least 20 minutes outside with dog    Time 8   Period Weeks   Status Achieved     PT LONG TERM GOAL #3   Title Pt to be able to stand for 10 minutes in order to make a small meal; ie sandwich    Time 8   Period Weeks   Status Achieved     PT LONG TERM GOAL #4   Title Pt to be able to single leg stance on both legs for 12 seconds or greater to allow pt to feel confident walking on uneven terrain    Baseline 8/9- 2 second max    Period Weeks   Status Not Met               Plan - 09/04/15 1914    Clinical Impression Statement Discharge assessment performed today. Patient appears to be doing quite well overall, with  many self-reported functional gains and improvements in QOL at home; she has been keeping up with her HEP regularly and has also been working on a seated exercise bike. Reviewed patient's goals and HEP, which appears to be very comprehensive and appropriate given patient's MS-related fatigue at this time, however also recommended that  patient try water exercise and encouraged her to continue regular walking program. Provided patient with information regarding [employer] and local walking club. DC today due to patient being satisfied with current level of function.    Rehab Potential Good   PT Treatment/Interventions ADLs/Self Care Home Management;Gait training;Functional mobility training;Therapeutic activities;Therapeutic exercise;Balance training;Neuromuscular re-education;Patient/family education   PT Next Visit Plan DC today    PT Home Exercise Plan water exercise and walking club, no other updates    Consulted and Agree with Plan of Care Patient      Patient will benefit from skilled therapeutic intervention in order to improve the following deficits and impairments:  Abnormal gait, Decreased activity tolerance, Decreased balance, Decreased strength, Difficulty walking, Postural dysfunction  Visit Diagnosis: History of falling  Unsteadiness on feet  Difficulty in walking, not elsewhere classified  Muscle weakness (generalized)       G-Codes - 2015-09-06 0853    Functional Assessment Tool Used Pt subjective on bed mobility and walking, objective measures    Functional Limitation Mobility: Walking and moving around   Mobility: Walking and Moving Around Goal Status [phone]) At least 40 percent but less than 60 percent impaired, limited or restricted   Mobility: Walking and Moving Around Discharge Status [phone]) At least 40 percent but less than 60 percent impaired, limited or restricted      Problem List Patient Active Problem List   Diagnosis Date Noted  . Celiac disease 04/02/2015  . Diarrhea 03/05/2015  . Loss of weight 03/05/2015  . Hyperlipidemia 12/13/2014  . Vitamin D deficiency 12/13/2014  . Type 2 diabetes mellitus with stage 2 chronic kidney disease, with long-term current use of insulin ([place]) 12/06/2014  . Essential hypertension, benign 12/06/2014  . Primary hypothyroidism 12/06/2014  . OSA on CPAP  03/10/2013  . Severe obesity (BMI >= 40) ([place]) 10/11/2012  . Back pain 07/28/2012  . History of falling 01/29/2012  . Bilateral leg weakness 01/29/2012  . Difficulty walking 01/29/2012  . Abnormality of gait 01/09/2012  . Multiple sclerosis ([place]) 01/09/2012   PHYSICAL THERAPY DISCHARGE SUMMARY  Visits from Start of Care: 16  Current functional level related to goals / functional outcomes: Patient doing quite well and is satisfied with her current level of function at this time; DC today    Remaining deficits: Muscle weakness, unsteadiness, reduced functional activity tolerance, postural/gait impairment    Education / Equipment: Water exercise, walking club, management of fatigue in [place]: Patient agrees to discharge.  Patient goals were partially met. Patient is being discharged due to being pleased with the current functional level.  ?????     Deniece Ree PT, DPT [facility] [address] [place], [place], [address] Phone: [phone]   Fax:  [phone]  Name: [name] MRN: [id number] Date of Birth: 06-16-1955

## 2015-09-05 DIAGNOSIS — Z7189 Other specified counseling: Secondary | ICD-10-CM | POA: Diagnosis not present

## 2015-09-05 DIAGNOSIS — Z299 Encounter for prophylactic measures, unspecified: Secondary | ICD-10-CM | POA: Diagnosis not present

## 2015-09-05 DIAGNOSIS — Z1389 Encounter for screening for other disorder: Secondary | ICD-10-CM | POA: Diagnosis not present

## 2015-09-05 DIAGNOSIS — Z6841 Body Mass Index (BMI) 40.0 and over, adult: Secondary | ICD-10-CM | POA: Diagnosis not present

## 2015-09-05 DIAGNOSIS — Z0000 Encounter for general adult medical examination without abnormal findings: Secondary | ICD-10-CM | POA: Diagnosis not present

## 2015-09-05 DIAGNOSIS — Z1211 Encounter for screening for malignant neoplasm of colon: Secondary | ICD-10-CM | POA: Diagnosis not present

## 2015-09-06 DIAGNOSIS — E039 Hypothyroidism, unspecified: Secondary | ICD-10-CM | POA: Diagnosis not present

## 2015-09-06 DIAGNOSIS — E7800 Pure hypercholesterolemia, unspecified: Secondary | ICD-10-CM | POA: Diagnosis not present

## 2015-09-06 DIAGNOSIS — E559 Vitamin D deficiency, unspecified: Secondary | ICD-10-CM | POA: Diagnosis not present

## 2015-09-06 DIAGNOSIS — Z79899 Other long term (current) drug therapy: Secondary | ICD-10-CM | POA: Diagnosis not present

## 2015-09-17 ENCOUNTER — Encounter: Payer: Self-pay | Admitting: Neurology

## 2015-09-17 ENCOUNTER — Ambulatory Visit (INDEPENDENT_AMBULATORY_CARE_PROVIDER_SITE_OTHER): Payer: Medicare Other | Admitting: Neurology

## 2015-09-17 VITALS — BP 110/78 | HR 84 | Resp 20 | Ht 64.0 in | Wt 231.0 lb

## 2015-09-17 DIAGNOSIS — Z79899 Other long term (current) drug therapy: Secondary | ICD-10-CM | POA: Diagnosis not present

## 2015-09-17 DIAGNOSIS — G35 Multiple sclerosis: Secondary | ICD-10-CM | POA: Diagnosis not present

## 2015-09-17 NOTE — Addendum Note (Signed)
Addended by: Geronimo Running A on: 09/17/2015 09:13 AM   Modules accepted: Orders

## 2015-09-17 NOTE — Progress Notes (Signed)
[facility]  Provider:  Melvyn Novasarmen  [name], M D  Referring Provider: Shaune PollackGates, Donna, MD Primary Care Physician:  Ignatius Speckinghruv B Vyas, MD  Chief Complaint  Patient presents with  . Follow-up    MS, going fine, on betaseron    HPI:  [name] is a 60 y.o. female  Is seen here as a revisit  from Dr. Kevan NyGates for multiple sclerosis follow up.   The patient had followed Dr. Orlin HildingWeymann for over 20 years , diagnosed since 1989 ,, and is now mainly seen by Darrol Angelarolyn Martin, NP. She has never been on another medication, but Betaseron.   In my last visit , 2013 , I suggested Ampyra to her , which we than initiated but had to D/C after her kidney function changed.  The patient is diabetic, diagnosed in 2008. She just changed in 2014 to insulin.  She remains on Betaseron. Has been gaining weight and has had a fall last December. Uses a Rollator when she is outdoors but indoors a walking stick or cane, but more and more frequently the walker. .   She believes her sleep is poor due to spasms and nocturia, she may snore. Still wakes with a dry mouth, has been told she snores. Sleeps prone. The patient underwent a sleep study I believe in 2014, but was unable to tolerate the CPAP due to her sleeping habit. She has a chief complaint today of cramping pain in the left leg, spasm, at the groin and right above the hip. No radiation , no burning and no deep ache - just spams. Onset 3 weeks ago.  Although, she has taken baclofen po now tid  for a long time , for the " MS hug" - it had an effect only on abdominal spasms. On the same dose, it has not affected the hip spasms.  She believes her sleep is poor due to spasms and nocturia, she may snore. Wakes with a dry mouth, has been told she snores. Sleeps prone.  The spasms in the left hip are preceded by sitting in certain low chairs in [employer] [facility] her volunteer work place.  Dr. Sherril CroonVyas  recently did some routine Labs, that are not available on EPIC.    Interval  history from 09/17/2015, [name] is seen here today after her May 2017 first visit with me. She had an excellent comprehensive metabolic panel result no abnormalities of liver or kidney function. She tends to have higher potassium levels but she was also not diluting properly, meaning the patient was probably dehydrated to some degree at the time of the blood draw. I also checked her for JC virus and it was -0.21 JC antibody is considered negative or indeterminate. Theoretically, this patient could be using an oral medication for the treatment of MS and many of these are stronger than the injectable interferons. However her MRI from recent has shown no acute lesions and her history doesn't really speak of recent relapses. 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 I will add the necessary labs today which are also including a hepatitis panel and a gold blood test for tuberculosis. If these are negative I will really prefer her to have an infusion therapy.   Review of Systems: Out of a complete 14 system review, the patient complains of only the following symptoms, and all other reviewed systems are negative.  Hip spasm,  pain , non radiating , weight gain, fatigue,  Gait disorder, falls , urinary incontinence, nocturia.    Social History   Social History  . Marital status: Single    Spouse name: N/A  . Number of children: 0  . Years of education: college   Occupational History  . disabled Not Employed   Social History Main Topics  . Smoking status: Never Smoker  . Smokeless tobacco: Never Used  . Alcohol use No     Comment: quit in 1975  . Drug use: No  . Sexual activity: No   Other Topics Concern  . Not on file   Social History Narrative   Patient is single and lives alone.   Patient is right-handed.   Patient has a college education.   Patient does not drink any  caffeine.   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    Family History  Problem Relation Age of Onset  . Heart attack Mother   . Diabetes Mother   . Aneurysm Father   . Heart disease Father   . Cancer Maternal Grandmother   . Colon cancer Neg Hx     Past Medical History:  Diagnosis Date  . Diabetes mellitus without complication (HCC)   . High cholesterol   . Hypertension   . Hypothyroidism   . Morbid obesity (HCC)   . MS (multiple sclerosis) (HCC) relapsing remitting  . Severe obesity (BMI >= 40) (HCC) 10/11/2012    Past Surgical History:  Procedure Laterality Date  . BIOPSY  03/19/2015   Procedure: BIOPSY;  Surgeon: West Bali, MD;  [facility];  Service: Endoscopy;;  random colon biopsy  . CHOLECYSTECTOMY  8/09  . COLONOSCOPY     about 7 years  . COLONOSCOPY WITH PROPOFOL N/A 03/19/2015   [id number] ileum and colon/small internal hemorrhoids  . LEFT OOPHORECTOMY Left 8/11    Current Outpatient Prescriptions  Medication Sig Dispense Refill  . aspirin 81 MG tablet Take 81 mg by mouth daily.    Marland Kitchen atorvastatin (LIPITOR) 40 MG tablet Take 40 mg by mouth daily.     . baclofen (LIORESAL) 10 MG tablet Take 1 tablet (10 mg total) by mouth 3 (three) times daily. 270 tablet 1  . ergocalciferol (VITAMIN D2) 50000 UNITS capsule Take 50,000 Units by mouth once a week. Takes on Thursdays.    Marland Kitchen imipramine (TOFRANIL) 25 MG tablet TAKE 1 TABLET AT BEDTIME 90 tablet 0  . interferon beta-1b (BETASERON) 0.3 MG injection Inject 0.3 mg into the skin every other day.    Marland Kitchen LANTUS SOLOSTAR 100 UNIT/ML SOPN Inject 44 Units into the skin at bedtime.    Marland Kitchen levothyroxine (SYNTHROID, LEVOTHROID) 50 MCG tablet Take 50 mcg by mouth daily before breakfast.     . metFORMIN (GLUCOPHAGE-XR) 500 MG 24 hr tablet Take 500 mg by mouth 2 (two) times daily.     Marland Kitchen RELION PEN NEEDLES 32G X 4 MM MISC     . Tamsulosin HCl (FLOMAX) 0.4 MG CAPS Take 0.4 mg by mouth daily after breakfast.      . telmisartan (MICARDIS) 20 MG tablet Take 20 mg by mouth daily.     Marland Kitchen tolterodine (DETROL LA) 4 MG 24 hr capsule TAKE 1 CAPSULE DAILY 90 capsule 3  . TOPROL XL 25 MG 24 hr tablet Take 25 mg by mouth daily.     . TRULICITY 1.5 MG/0.5ML SOPN INJECT 1.5 MG INTO THE SKIN ONCE A WEEK 2  mL 2   No current facility-administered medications for this visit.     Allergies as of 09/17/2015 - Review Complete 09/17/2015  Allergen Reaction Noted  . Penicillins Anaphylaxis 10/07/2011  . Lotensin [benazepril hcl] Cough 03/11/2015   patient lost 50 pounds since 2016 .  Vitals: BP 110/78   Pulse 84   Resp 20   Ht 5\' 4"  (1.626 m)   Wt 231 lb (104.8 kg)   BMI 39.65 kg/m  Last Weight:  Wt Readings from Last 1 Encounters:  09/17/15 231 lb (104.8 kg)   Last Height:   Ht Readings from Last 1 Encounters:  09/17/15 5\' 4"  (1.626 m)    Physical exam:  General: The patient is awake, alert and appears not in acute distress. The patient is well groomed. Head: Normocephalic, atraumatic.  Neck is supple. Mallampati 3 , neck circumference: 15 inches , no retrognathia, no retainers, no TMJ, no nasal deviation  Cardiovascular:  Regular rate and rhythm, without  murmurs or carotid bruit, and without distended neck veins. Respiratory: Lungs are clear to auscultation. Skin:  Without evidence of edema, or rash Trunk: BMI is elevated .  Neurologic exam : The patient is awake and alert, oriented to place and time.   Memory subjective described as impaired- slower , some word finding .  There is a normal attention span & concentration ability.  Speech is fluent without  dysarthria, dysphonia- Mood and affect are appropriate.  Cranial nerves:  Smell and taste is preserved. Pupils are equal and briskly reactive to light. Funduscopic exam without  evidence of pallor or edema. No history of optic neuritis. Extraocular movements  in vertical and horizontal planes intact and without nystagmus. Visual fields by  finger perimetry are intact. Hearing to finger rub intact.  Facial sensation intact to fine touch. Facial motor strength is symmetric and tongue and uvula move midline. Motor exam: Normal tone and normal muscle bulk and symmetric normal strength in all extremities. Sensory:  Fine touch, pinprick and vibration were tested in all extremities. Proprioception in upper extremities is normal. Coordination: Rapid alternating movements in the fingers/hands is tested and normal. Finger-to-nose maneuver tested and normal without evidence of ataxia, dysmetria or tremor. The patient has trouble lifting the right foot and adducting/ flexing the right hip.  She places her left leg first on the stool before she tries to sit up at the exam table, She is unable to turn, unable to rise without bracing herself,  Patient walks with assistive device, a walker with built in seat  She reports not using the seat much.  Deep tendon reflexes: in the  upper and lower extremities are symmetric, brisk  and intact.  Babinski maneuver response is up going right, and down going left .  09-17-2015  35 minute re-Assessment with ore than 50% of the time dedicated to face to face discussion and coordination of care. :   Originally MS relapsing - remitting , but now  without relapse over 10 years . May enter the  progressive phase of the disease.  She will remain on Betaseron, not oral medication. 20 years .  She was not doing well on AMPYRA, her renal function declined..  Fatigue due to MS or sleepiness from nocturia and apnea?  Sleep study revealed mild AHI, tried CPAP but couldn't tolerate the sleep position. She sleeps usually prone/  Baclofen now 10 mg tid. Spasm are most bother some at night. She has refills.   PT re - evaluation , fall  prevention to be re- discussed.   She uses a walker  .   Her urologist at [facility] - she sees Dr. Patsi Sears.  CMET was normal, JCV indeterminate, MRI without acute brain lesions.   [facility]  [facility] [address], [address] [place], [place] [address] [phone]  NEUROIMAGING REPORT    STUDY DATE: 09/03/2014 PATIENT NAME: [name] DOB: [birth year] MRN: [id number]  EXAM: MRI Brain without contrast  ORDERING CLINICIAN: Nilda Riggs, NP CLINICAL HISTORY: 60 year old woman with multiple sclerosis COMPARISON FILMS: None available  TECHNIQUE: MRI of the brain without contrast was obtained utilizing 5 mm axial slices with T1, T2, T2 flair, SWI and diffusion weighted views.  T1 sagittal and T2 coronal views were obtained. CONTRAST: none IMAGING SITE: [facility], [address] [place], [place]  FINDINGS: On sagittal images, the spinal cord is imaged caudally to C3-C4 and is normal in caliber.   The contents of the posterior fossa are of normal size and position.   The pituitary gland and optic chiasm appear normal.    Brain volume appears normal.   The ventricles are normal in size and without distortion.  There are no abnormal extra-axial collections of fluid.    The brainstem appears normal.  There is a small T2/FLAIR hyperintense focus in the right cerebellar hemisphere. The deep gray matter appears normal.  In the hemispheres, there are multiple T2/FLAIR hyperintense foci in the periventricular, deep and juxtacortical white matter. Many of the periventricular foci are radially oriented to the ventricles. Some of the foci are hypointense on T1-weighted images. The orbits appear normal.   The VIIth/VIIIth nerve complex appears normal.  The mastoid air cells appear normal.  The paranasal sinuses appear normal.  Flow voids are identified within the major intracerebral arteries.     Diffusion weighted images are normal.  Susceptibility weighted images are normal.     IMPRESSION:  This is an abnormal MRI of the brain without contrast showing T2/FLAIR hyperintense foci in both hemispheres in the right cerebellar hemisphere in a pattern  consistent with the diagnosis of multiple sclerosis. The study was performed without contrast and prior films are not available so the acuity of the foci are difficult to judge. However, the diffusion-weighted images did not show any obviously acute lesions.       INTERPRETING PHYSICIAN:  Richard A. Epimenio Foot, MD, PhD Certified in  Neuroimaging by [employer]

## 2015-09-18 ENCOUNTER — Telehealth: Payer: Self-pay | Admitting: Neurology

## 2015-09-18 LAB — CBC WITH DIFFERENTIAL/PLATELET
BASOS: 1 %
Basophils Absolute: 0 10*3/uL (ref 0.0–0.2)
EOS (ABSOLUTE): 0.2 10*3/uL (ref 0.0–0.4)
EOS: 3 %
HEMATOCRIT: 37.2 % (ref 34.0–46.6)
Hemoglobin: 12 g/dL (ref 11.1–15.9)
Immature Grans (Abs): 0 10*3/uL (ref 0.0–0.1)
Immature Granulocytes: 0 %
LYMPHS ABS: 0.8 10*3/uL (ref 0.7–3.1)
Lymphs: 13 %
MCH: 26.9 pg (ref 26.6–33.0)
MCHC: 32.3 g/dL (ref 31.5–35.7)
MCV: 83 fL (ref 79–97)
MONOS ABS: 0.7 10*3/uL (ref 0.1–0.9)
Monocytes: 11 %
Neutrophils Absolute: 4.4 10*3/uL (ref 1.4–7.0)
Neutrophils: 72 %
PLATELETS: 329 10*3/uL (ref 150–379)
RBC: 4.46 x10E6/uL (ref 3.77–5.28)
RDW: 14.6 % (ref 12.3–15.4)
WBC: 6.1 10*3/uL (ref 3.4–10.8)

## 2015-09-18 LAB — COMPREHENSIVE METABOLIC PANEL
ALBUMIN: 4 g/dL (ref 3.5–5.5)
ALK PHOS: 103 IU/L (ref 39–117)
ALT: 22 IU/L (ref 0–32)
AST: 19 IU/L (ref 0–40)
Albumin/Globulin Ratio: 1.3 (ref 1.2–2.2)
BUN / CREAT RATIO: 24 — AB (ref 9–23)
BUN: 26 mg/dL — ABNORMAL HIGH (ref 6–24)
Bilirubin Total: 0.4 mg/dL (ref 0.0–1.2)
CALCIUM: 9.4 mg/dL (ref 8.7–10.2)
CO2: 21 mmol/L (ref 18–29)
CREATININE: 1.09 mg/dL — AB (ref 0.57–1.00)
Chloride: 103 mmol/L (ref 96–106)
GFR calc Af Amer: 65 mL/min/{1.73_m2} (ref >59)
GFR, EST NON AFRICAN AMERICAN: 56 mL/min/{1.73_m2} — AB (ref >59)
GLOBULIN, TOTAL: 3.2 g/dL (ref 1.5–4.5)
Glucose: 116 mg/dL — ABNORMAL HIGH (ref 65–99)
POTASSIUM: 5.3 mmol/L — AB (ref 3.5–5.2)
SODIUM: 143 mmol/L (ref 134–144)
Total Protein: 7.2 g/dL (ref 6.0–8.5)

## 2015-09-18 LAB — HEPATITIS B CORE ANTIBODY, TOTAL: Hep B Core Total Ab: NEGATIVE

## 2015-09-18 LAB — HEPATITIS B SURFACE ANTIGEN: Hepatitis B Surface Ag: NEGATIVE

## 2015-09-18 LAB — HEPATITIS B SURFACE ANTIBODY,QUALITATIVE: Hep B Surface Ab, Qual: NONREACTIVE

## 2015-09-18 NOTE — Telephone Encounter (Signed)
Patient called, states when she was here for visit with Dr. Vickey Hugerohmeier yesterday, Dr. Vickey Hugerohmeier mentioned a new drug to try her on, based on MRI, patient states the MRI was from 09/03/14 and wonders if Dr. Vickey Hugerohmeier was aware of this. Please call to advise.

## 2015-09-18 NOTE — Telephone Encounter (Signed)
I spoke to pt and advised her that Dr. Vickey Huger needs a recent MRI, one within the past 6 months. Pt verbalized understanding.

## 2015-09-18 NOTE — Telephone Encounter (Signed)
Yes, we need a fresh MRI , not older than 6 month. CD

## 2015-09-18 NOTE — Telephone Encounter (Signed)
Pt's last MRI Brain was on 09/03/2014. She wants to know if she should still have another MRI Brain since she just had one a year ago to start ocrevus.

## 2015-09-19 ENCOUNTER — Telehealth: Payer: Self-pay

## 2015-09-19 LAB — QUANTIFERON IN TUBE
QFT TB AG MINUS NIL VALUE: 0.01 IU/mL
QUANTIFERON MITOGEN VALUE: 1.03 IU/mL
QUANTIFERON NIL VALUE: 0.03 [IU]/mL
QUANTIFERON TB AG VALUE: 0.04 [IU]/mL
QUANTIFERON TB GOLD: NEGATIVE

## 2015-09-19 LAB — QUANTIFERON TB GOLD ASSAY (BLOOD)

## 2015-09-19 NOTE — Telephone Encounter (Signed)
I spoke to [name]. I advised her that her labs for hepatitis were negative. I advised her that Dr. Vickey Hugerohmeier advised her to drink more water, at least 6 large glasses of water daily. TB test still pending. [name] verbalized understanding of results. [name] had no questions at this time but was encouraged to call back if questions arise.

## 2015-09-19 NOTE — Telephone Encounter (Signed)
-----   Message from Melvyn Novasarmen Dohmeier, MD sent at 09/18/2015  5:07 PM EDT ----- No TB or hep B positive tests, ready for OCREVUS ? You need to hydrate more, please drink 6 large glasses of water a day

## 2015-09-23 ENCOUNTER — Telehealth: Payer: Self-pay

## 2015-09-23 NOTE — Telephone Encounter (Signed)
-----   Message from Melvyn Novas, MD sent at 09/20/2015 12:26 PM EDT ----- TB negative - you can consider  OCREVUS therapy.

## 2015-09-23 NOTE — Telephone Encounter (Signed)
I left a detailed message on pt's home phone (per DPR) advising her that her TB test was negative and that she may consider the new MS medication and to please call me back if she has any further questions.

## 2015-10-03 ENCOUNTER — Ambulatory Visit (INDEPENDENT_AMBULATORY_CARE_PROVIDER_SITE_OTHER): Payer: Medicare Other | Admitting: Nurse Practitioner

## 2015-10-03 ENCOUNTER — Encounter: Payer: Self-pay | Admitting: Nurse Practitioner

## 2015-10-03 VITALS — BP 104/76 | HR 96 | Temp 97.6°F | Ht 64.0 in | Wt 232.4 lb

## 2015-10-03 DIAGNOSIS — K900 Celiac disease: Secondary | ICD-10-CM | POA: Diagnosis not present

## 2015-10-03 NOTE — Progress Notes (Signed)
cc'ed to pcp °

## 2015-10-03 NOTE — Progress Notes (Signed)
Referring Provider: Shaune Pollack, MD Primary Care Physician:  [name], MD Primary GI:  Dr. Darrick Penna  Chief Complaint  Patient presents with  . Follow-up    HPI:   [name] is a 60 y.o. female who presents For follow-up on weight loss and diarrhea with positive celiac disease based on serum testing. The patient was last seen in our office 04/02/2015 at which point he was doing well on dysphagia diet and Tums with meals. She admitted to having consistent formed stools for the first time in months, no further weight loss. No red flags or warning signs or symptoms. 4 pound weight gain since visit prior to. Recommended continue gluten-free diet and follow-up in 6 months.  Today she states she's doing well overall. Has been having a little easier time with the gluten-free diet. Has not had diarrhea in about 3 weeks. Did have a single episode of diarrhea 3 weeks ago because she ate chocolate, which causes her problems. Has put more weight back on (2 pounds compared to last visit here). She [facility] [employer] specifically [facility] classes which helps her balance issues related to MS. Denies abdominal pain, N/V, hematochezia, melena, fever, chills. Denies chest pain, dyspnea, dizziness, lightheadedness, syncope, near syncope. Denies any other upper or lower GI symptoms.  Past Medical History:  Diagnosis Date  . Diabetes mellitus without complication (HCC)   . High cholesterol   . Hypertension   . Hypothyroidism   . Morbid obesity (HCC)   . MS (multiple sclerosis) (HCC) relapsing remitting  . Severe obesity (BMI >= 40) (HCC) 10/11/2012    Past Surgical History:  Procedure Laterality Date  . BIOPSY  03/19/2015   Procedure: BIOPSY;  Surgeon: West Bali, MD;  [facility]: [facility];  Service: Endoscopy;;  random colon biopsy  . CHOLECYSTECTOMY  8/09  . COLONOSCOPY     about 7 years  . COLONOSCOPY WITH PROPOFOL N/A 03/19/2015   [id number] ileum and  colon/small internal hemorrhoids  . LEFT OOPHORECTOMY Left 8/11    Current Outpatient Prescriptions  Medication Sig Dispense Refill  . aspirin 81 MG tablet Take 81 mg by mouth daily.    Marland Kitchen atorvastatin (LIPITOR) 40 MG tablet Take 40 mg by mouth daily.     . baclofen (LIORESAL) 10 MG tablet Take 1 tablet (10 mg total) by mouth 3 (three) times daily. 270 tablet 1  . ergocalciferol (VITAMIN D2) 50000 UNITS capsule Take 50,000 Units by mouth once a week. Takes on Thursdays.    Marland Kitchen imipramine (TOFRANIL) 25 MG tablet TAKE 1 TABLET AT BEDTIME 90 tablet 0  . interferon beta-1b (BETASERON) 0.3 MG injection Inject 0.3 mg into the skin every other day.    Marland Kitchen LANTUS SOLOSTAR 100 UNIT/ML SOPN Inject 44 Units into the skin at bedtime.    Marland Kitchen levothyroxine (SYNTHROID, LEVOTHROID) 50 MCG tablet Take 50 mcg by mouth daily before breakfast.     . metFORMIN (GLUCOPHAGE-XR) 500 MG 24 hr tablet Take 500 mg by mouth 2 (two) times daily.     Marland Kitchen RELION PEN NEEDLES 32G X 4 MM MISC     . Tamsulosin HCl (FLOMAX) 0.4 MG CAPS Take 0.4 mg by mouth daily after breakfast.     . telmisartan (MICARDIS) 20 MG tablet Take 20 mg by mouth daily.     Marland Kitchen tolterodine (DETROL LA) 4 MG 24 hr capsule TAKE 1 CAPSULE DAILY 90 capsule 3  . TOPROL XL  25 MG 24 hr tablet Take 25 mg by mouth daily.     . TRULICITY 1.5 MG/0.5ML SOPN INJECT 1.5 MG INTO THE SKIN ONCE A WEEK 2 mL 2   No current facility-administered medications for this visit.     Allergies as of 10/03/2015 - Review Complete 10/03/2015  Allergen Reaction Noted  . Penicillins Anaphylaxis 10/07/2011  . Lotensin [benazepril hcl] Cough 03/11/2015    Family History  Problem Relation Age of Onset  . Heart attack Mother   . Diabetes Mother   . Aneurysm Father   . Heart disease Father   . Cancer Maternal Grandmother   . Colon cancer Neg Hx     Social History   Social History  . Marital status: Single    Spouse name: N/A  . Number of children: 0  . Years of education:  college   Occupational History  . disabled Not Employed   Social History Main Topics  . Smoking status: Never Smoker  . Smokeless tobacco: Never Used  . Alcohol use No     Comment: quit in 1975  . Drug use: No  . Sexual activity: No   Other Topics Concern  . Not on file   Social History Narrative   Patient is single and lives alone.   Patient is right-handed.   Patient has a college education.   Patient does not drink any caffeine.   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    Review of Systems: 10-point ROS negative except as per HPI.   Physical Exam: BP 104/76   Pulse 96   Temp 97.6 F (36.4 C) (Oral)   Ht 5\' 4"  (1.626 m)   Wt 232 lb 6.4 oz (105.4 kg)   BMI 39.89 kg/m  General:   Obese female. Alert and oriented. Pleasant and cooperative. Well-nourished and well-developed.  Ears:  Normal auditory acuity. Cardiovascular:  S1, S2 present without murmurs appreciated. Extremities without clubbing or edema. Respiratory:  Clear to auscultation bilaterally. No wheezes, rales, or rhonchi. No distress.  Gastrointestinal:  +BS, rounded but soft, non-tender and non-distended. No guarding or rebound.  Rectal:  Deferred  Musculoskalatal:  Symmetrical without gross deformities.  Neurologic:  Alert and oriented x4;  grossly normal neurologically. Psych:  Alert and cooperative. Normal mood and affect. Heme/Lymph/Immune: No excessive bruising noted.    10/03/2015 8:18 AM   Disclaimer: This note was dictated with voice recognition software. Similar sounding words can inadvertently be transcribed and may not be corrected upon review.

## 2015-10-03 NOTE — Assessment & Plan Note (Signed)
Symptoms continue to be resolved essentially. Had 1 episode of diarrhea 3 weeks ago when she ate chocolate which tends to bother her system. Otherwise no diarrhea and a while. She is finding it easier to follow a gluten-free diet. [facility] [employer] [facility] to help with balance issues related to MS. No abdominal pain, nausea, vomiting, hematochezia, melena, or other red flag/warning signs or symptoms. Recommend she continue gluten-free diet, return for follow-up in one year. She can call us before then she has any questions or problems.

## 2015-10-03 NOTE — Patient Instructions (Signed)
1. Continue gluten-free diet 2. Return for follow-up in 1 year 3. Call if you have any questions or problems before then 4. [facility] [employer]!

## 2015-11-04 ENCOUNTER — Other Ambulatory Visit: Payer: Self-pay | Admitting: Neurology

## 2015-12-02 ENCOUNTER — Telehealth: Payer: Self-pay | Admitting: "Endocrinology

## 2015-12-02 DIAGNOSIS — E039 Hypothyroidism, unspecified: Secondary | ICD-10-CM

## 2015-12-02 DIAGNOSIS — E118 Type 2 diabetes mellitus with unspecified complications: Principal | ICD-10-CM

## 2015-12-02 DIAGNOSIS — E1165 Type 2 diabetes mellitus with hyperglycemia: Secondary | ICD-10-CM

## 2015-12-02 NOTE — Telephone Encounter (Signed)
Orders placed to [facility]

## 2015-12-02 NOTE — Telephone Encounter (Signed)
Pt hasn't been in for a year - needs new labs put in for [facility]. She is going this week to get them done.

## 2015-12-03 DIAGNOSIS — E1165 Type 2 diabetes mellitus with hyperglycemia: Secondary | ICD-10-CM | POA: Diagnosis not present

## 2015-12-03 DIAGNOSIS — Z299 Encounter for prophylactic measures, unspecified: Secondary | ICD-10-CM | POA: Diagnosis not present

## 2015-12-03 DIAGNOSIS — Z6841 Body Mass Index (BMI) 40.0 and over, adult: Secondary | ICD-10-CM | POA: Diagnosis not present

## 2015-12-03 DIAGNOSIS — G35 Multiple sclerosis: Secondary | ICD-10-CM | POA: Diagnosis not present

## 2015-12-03 DIAGNOSIS — I10 Essential (primary) hypertension: Secondary | ICD-10-CM | POA: Diagnosis not present

## 2015-12-10 DIAGNOSIS — E118 Type 2 diabetes mellitus with unspecified complications: Secondary | ICD-10-CM | POA: Diagnosis not present

## 2015-12-10 DIAGNOSIS — E039 Hypothyroidism, unspecified: Secondary | ICD-10-CM | POA: Diagnosis not present

## 2015-12-10 DIAGNOSIS — E1165 Type 2 diabetes mellitus with hyperglycemia: Secondary | ICD-10-CM | POA: Diagnosis not present

## 2015-12-11 LAB — BASIC METABOLIC PANEL
BUN: 38 mg/dL — ABNORMAL HIGH (ref 7–25)
CHLORIDE: 105 mmol/L (ref 98–110)
CO2: 27 mmol/L (ref 20–31)
CREATININE: 1.08 mg/dL — AB (ref 0.50–1.05)
Calcium: 9.2 mg/dL (ref 8.6–10.4)
Glucose, Bld: 127 mg/dL — ABNORMAL HIGH (ref 65–99)
POTASSIUM: 5 mmol/L (ref 3.5–5.3)
Sodium: 139 mmol/L (ref 135–146)

## 2015-12-11 LAB — TSH: TSH: 1.95 m[IU]/L

## 2015-12-11 LAB — T4, FREE: FREE T4: 1.1 ng/dL (ref 0.8–1.8)

## 2015-12-12 DIAGNOSIS — I10 Essential (primary) hypertension: Secondary | ICD-10-CM | POA: Diagnosis not present

## 2015-12-12 DIAGNOSIS — E7800 Pure hypercholesterolemia, unspecified: Secondary | ICD-10-CM | POA: Diagnosis not present

## 2015-12-12 DIAGNOSIS — E119 Type 2 diabetes mellitus without complications: Secondary | ICD-10-CM | POA: Diagnosis not present

## 2015-12-12 LAB — HEMOGLOBIN A1C
Hgb A1c MFr Bld: 8.2 % — ABNORMAL HIGH (ref <5.7)
MEAN PLASMA GLUCOSE: 189 mg/dL

## 2015-12-13 ENCOUNTER — Ambulatory Visit (INDEPENDENT_AMBULATORY_CARE_PROVIDER_SITE_OTHER): Payer: Medicare Other | Admitting: "Endocrinology

## 2015-12-13 ENCOUNTER — Encounter: Payer: Self-pay | Admitting: "Endocrinology

## 2015-12-13 VITALS — BP 90/60 | HR 102 | Ht 64.0 in | Wt 241.0 lb

## 2015-12-13 DIAGNOSIS — E1122 Type 2 diabetes mellitus with diabetic chronic kidney disease: Secondary | ICD-10-CM

## 2015-12-13 DIAGNOSIS — E782 Mixed hyperlipidemia: Secondary | ICD-10-CM | POA: Diagnosis not present

## 2015-12-13 DIAGNOSIS — Z794 Long term (current) use of insulin: Secondary | ICD-10-CM | POA: Diagnosis not present

## 2015-12-13 DIAGNOSIS — I10 Essential (primary) hypertension: Secondary | ICD-10-CM

## 2015-12-13 DIAGNOSIS — E039 Hypothyroidism, unspecified: Secondary | ICD-10-CM | POA: Diagnosis not present

## 2015-12-13 DIAGNOSIS — N182 Chronic kidney disease, stage 2 (mild): Secondary | ICD-10-CM

## 2015-12-13 MED ORDER — LEVOTHYROXINE SODIUM 75 MCG PO TABS: 75.0000 ug | ORAL_TABLET | Freq: Every day | ORAL | 3 refills | Status: DC

## 2015-12-13 NOTE — Patient Instructions (Signed)

## 2015-12-13 NOTE — Progress Notes (Signed)
Subjective:    Patient ID: [name], female    DOB: Oct 12, 1955,  [name], MD   Past Medical History:  Diagnosis Date  . Diabetes mellitus without complication (HCC)   . High cholesterol   . Hypertension   . Hypothyroidism   . Morbid obesity (HCC)   . MS (multiple sclerosis) (HCC) relapsing remitting  . Severe obesity (BMI >= 40) (HCC) 10/11/2012   Past Surgical History:  Procedure Laterality Date  . BIOPSY  03/19/2015   Procedure: BIOPSY;  Surgeon: West Bali, MD;  Location: [facility];  Service: Endoscopy;;  random colon biopsy  . CHOLECYSTECTOMY  8/09  . COLONOSCOPY     about 7 years  . COLONOSCOPY WITH PROPOFOL N/A 03/19/2015   GEX:BMWUXL ileum and colon/small internal hemorrhoids  . LEFT OOPHORECTOMY Left 8/11   Social History   Social History  . Marital status: Single    Spouse name: N/A  . Number of children: 0  . Years of education: college   Occupational History  . disabled Not Employed   Social History Main Topics  . Smoking status: Never Smoker  . Smokeless tobacco: Never Used  . Alcohol use No     Comment: quit in 1975  . Drug use: No  . Sexual activity: No   Other Topics Concern  . None   Social History Narrative   Patient is single and lives alone.   Patient is right-handed.   Patient has a college education.   Patient does not drink any caffeine.   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   Outpatient Encounter Prescriptions as of 12/13/2015  Medication Sig  . aspirin 81 MG tablet Take 81 mg by mouth daily.  Marland Kitchen atorvastatin (LIPITOR) 40 MG tablet Take 40 mg by mouth daily.   . baclofen (LIORESAL) 10 MG tablet TAKE 1 TABLET THREE TIMES A DAY  . ergocalciferol (VITAMIN D2) 50000 UNITS capsule Take 50,000 Units by mouth once a week. Takes on Thursdays.  Marland Kitchen imipramine (TOFRANIL) 25 MG tablet TAKE 1 TABLET AT BEDTIME  . interferon beta-1b (BETASERON) 0.3 MG injection Inject 0.3 mg into the skin every other day.  Marland Kitchen  LANTUS SOLOSTAR 100 UNIT/ML SOPN Inject 50 Units into the skin at bedtime.  Marland Kitchen levothyroxine (SYNTHROID, LEVOTHROID) 75 MCG tablet Take 1 tablet (75 mcg total) by mouth daily before breakfast.  . metFORMIN (GLUCOPHAGE-XR) 500 MG 24 hr tablet Take 500 mg by mouth 2 (two) times daily.   Marland Kitchen RELION PEN NEEDLES 32G X 4 MM MISC   . Tamsulosin HCl (FLOMAX) 0.4 MG CAPS Take 0.4 mg by mouth daily after breakfast.   . telmisartan (MICARDIS) 20 MG tablet Take 20 mg by mouth daily.   Marland Kitchen tolterodine (DETROL LA) 4 MG 24 hr capsule TAKE 1 CAPSULE DAILY  . TOPROL XL 25 MG 24 hr tablet Take 25 mg by mouth daily.   . TRULICITY 1.5 MG/0.5ML SOPN INJECT 1.5 MG INTO THE SKIN ONCE A WEEK  . [DISCONTINUED] levothyroxine (SYNTHROID, LEVOTHROID) 50 MCG tablet Take 50 mcg by mouth daily before breakfast.    No facility-administered encounter medications on file as of 12/13/2015.    ALLERGIES: Allergies  Allergen Reactions  . Penicillins Anaphylaxis    Has patient had a PCN reaction causing immediate rash, facial/tongue/throat swelling, SOB or lightheadedness with hypotension: Yes Has patient had a PCN reaction causing severe rash involving mucus membranes or skin necrosis: No Has patient had a  PCN reaction that required hospitalization No Has patient had a PCN reaction occurring within the last 10 years: No If all of the above answers are "NO", then may proceed with Cephalosporin use.   Marland Kitchen. Lotensin [Benazepril Hcl] Cough   VACCINATION STATUS:  There is no immunization history on file for this patient.  Diabetes  She presents for her follow-up diabetic visit. She has type 2 diabetes mellitus. Onset time: She was diagnosed at approximate age of 52 years. Her disease course has been improving. There are no hypoglycemic associated symptoms. Pertinent negatives for hypoglycemia include no confusion, headaches, pallor or seizures. Associated symptoms include fatigue. Pertinent negatives for diabetes include no chest pain,  no polydipsia, no polyphagia and no polyuria. There are no hypoglycemic complications. Symptoms are improving. Diabetic complications include nephropathy and peripheral neuropathy. Risk factors for coronary artery disease include diabetes mellitus, dyslipidemia, hypertension and sedentary lifestyle. Current diabetic treatment includes insulin injections and oral agent (monotherapy). She is compliant with treatment most of the time. Her weight is stable. She is following a generally unhealthy diet. She has had a previous visit with a dietitian. She never participates in exercise. Her home blood glucose trend is decreasing steadily. (She did not bring any meter nor logs to review today.) An ACE inhibitor/angiotensin II receptor blocker is being taken.  Thyroid Problem  Presents for follow-up visit. Symptoms include fatigue. Patient reports no cold intolerance, diarrhea, heat intolerance or palpitations. The symptoms have been improving. Past treatments include levothyroxine. Her past medical history is significant for hyperlipidemia.  Hyperlipidemia  This is a chronic problem. The current episode started more than 1 year ago. Pertinent negatives include no chest pain, myalgias or shortness of breath. Current antihyperlipidemic treatment includes statins.  Hypertension  This is a chronic problem. The current episode started more than 1 year ago. Pertinent negatives include no chest pain, headaches, palpitations or shortness of breath. Past treatments include angiotensin blockers. Hypertensive end-organ damage includes a thyroid problem.    Review of Systems  Constitutional: Positive for fatigue. Negative for unexpected weight change.  HENT: Negative for trouble swallowing and voice change.   Eyes: Negative for visual disturbance.  Respiratory: Negative for cough, shortness of breath and wheezing.   Cardiovascular: Negative for chest pain, palpitations and leg swelling.  Gastrointestinal: Negative for  diarrhea, nausea and vomiting.  Endocrine: Negative for cold intolerance, heat intolerance, polydipsia, polyphagia and polyuria.  Musculoskeletal: Negative for arthralgias and myalgias.  Skin: Negative for color change, pallor, rash and wound.  Neurological: Negative for seizures and headaches.  Psychiatric/Behavioral: Negative for confusion and suicidal ideas.    Objective:    BP 90/60   Pulse (!) 102   Ht 5\' 4"  (1.626 m)   Wt 241 lb (109.3 kg)   BMI 41.37 kg/m   Wt Readings from Last 3 Encounters:  12/13/15 241 lb (109.3 kg)  10/03/15 232 lb 6.4 oz (105.4 kg)  09/17/15 231 lb (104.8 kg)    Physical Exam  Constitutional: She is oriented to person, place, and time. She appears well-developed.  HENT:  Head: Normocephalic and atraumatic.  Eyes: EOM are normal.  Neck: Normal range of motion. Neck supple. No tracheal deviation present. No thyromegaly present.  Cardiovascular: Normal rate and regular rhythm.   Pulmonary/Chest: Effort normal and breath sounds normal.  Abdominal: Soft. Bowel sounds are normal. There is no tenderness. There is no guarding.  Musculoskeletal: She exhibits no edema.  She walks with her walker due to multiple sclerosis.  Neurological: She  is alert and oriented to person, place, and time. She has normal reflexes. No cranial nerve deficit. Coordination normal.  Skin: Skin is warm and dry. No rash noted. No erythema. No pallor.  Psychiatric: She has a normal mood and affect. Judgment normal.    Results for orders placed or performed in visit on 12/02/15  Hemoglobin A1c  Result Value Ref Range   Hgb A1c MFr Bld 8.2 (H) <5.7 %   Mean Plasma Glucose 189 mg/dL  TSH  Result Value Ref Range   TSH 1.95 mIU/L  T4, free  Result Value Ref Range   Free T4 1.1 0.8 - 1.8 ng/dL  Basic Metabolic Panel  Result Value Ref Range   Sodium 139 135 - 146 mmol/L   Potassium 5.0 3.5 - 5.3 mmol/L   Chloride 105 98 - 110 mmol/L   CO2 27 20 - 31 mmol/L   Glucose, Bld 127  (H) 65 - 99 mg/dL   BUN 38 (H) 7 - 25 mg/dL   Creat 3.78 (H) 5.88 - 1.05 mg/dL   Calcium 9.2 8.6 - 50.2 mg/dL   Complete Blood Count (Most recent): Lab Results  Component Value Date   WBC 6.1 09/17/2015   HGB 11.4 (L) 03/05/2015   HCT 37.2 09/17/2015   MCV 83 09/17/2015   PLT 329 09/17/2015   Chemistry (most recent): Lab Results  Component Value Date   NA 139 12/10/2015   K 5.0 12/10/2015   CL 105 12/10/2015   CO2 27 12/10/2015   BUN 38 (H) 12/10/2015   CREATININE 1.08 (H) 12/10/2015   Diabetic Labs (most recent): Lab Results  Component Value Date   HGBA1C 8.2 (H) 12/10/2015   HGBA1C 8.6 (A) 11/28/2014   Lipid profile (most recent): Lab Results  Component Value Date   TRIG 77 09/08/2007         Assessment & Plan:   1. Type 2 diabetes mellitus with stage 2 chronic kidney disease, with long-term current use of insulin (HCC)  Her diabetes is  complicated by CK D and patient remains at a high risk for more acute and chronic complications of diabetes which include CAD, CVA, CKD, retinopathy, and neuropathy. These are all discussed in detail with the patient.  Patient came with controlled glucose profile, however  recent A1c is Improved to 8.2% from 8.6 %. She did not bring her meter and logs for visit today. Recent labs reviewed.   - I have re-counseled the patient on diet management and weight loss  by adopting a carbohydrate restricted / protein rich  Diet.  - Suggestion is made for patient to avoid simple carbohydrates   from their diet including Cakes , Desserts, Ice Cream,  Soda (  diet and regular) , Sweet Tea , Candies,  Chips, Cookies, Artificial Sweeteners,   and "Sugar-free" Products .  This will help patient to have stable blood glucose profile and potentially avoid unintended  Weight gain.  - Patient is advised to stick to a routine mealtimes to eat 3 meals  a day and avoid unnecessary snacks ( to snack only to correct hypoglycemia).  - The patient  Has  been  scheduled with Norm Salt, RDN, CDE for individualized DM education.  - I have approached patient with the following individualized plan to manage diabetes and patient agrees.  -I will increase Lantus 50 units QHS, associated with monitoring of glucose QAM, and when necessary. -She will not need prandial insulin for now .  -Patient is encouraged to  call clinic for blood glucose levels less than 70 or above 300 mg /dl. -For better insulin sensitivity I will continue MTF at lower dose of 500mg  po BID. -Due to CKD patient is not a candidate for SGLT2 inhibitors. -Patient will continue Trulicity 1.5mg  weekly. -Target numbers for A1c, LDL, HDL, Triglycerides, Waist Circumference were discussed in detail.   - Patient specific target  for A1c; LDL, HDL, Triglycerides, and  Waist Circumference were discussed in detail.  2) BP/HTN: Controlled. Continue current medications including ACEI/ARB. 3) Lipids/HPL:  continue statins. 4)  Weight/Diet: CDE consult in progress, exercise, and carbohydrates information provided. 5)  Primary hypothyroidism - She would benefit from slight increase in her levothyroxine. I will prescribe levothyroxine 75 g by mouth every morning to replace her existing prescription for 50 g.  - We discussed about correct intake of levothyroxine, at fasting, with water, separated by at least 30 minutes from breakfast, and separated by more than 4 hours from calcium, iron, multivitamins, acid reflux medications (PPIs). -Patient is made aware of the fact that thyroid hormone replacement is needed for life, dose to be adjusted by periodic monitoring of thyroid function tests.  6) Chronic Care/Health Maintenance:  -Patient is on ACEI/ARB and Statin medications and encouraged to continue to follow up with Ophthalmology, Podiatrist at least yearly or according to recommendations, and advised to  stay away from smoking. I have recommended yearly flu vaccine and pneumonia  vaccination at least every 5 years; moderate intensity exercise for up to 150 minutes weekly; and  sleep for at least 7 hours a day.  - 25 minutes of time was spent on the care of this patient , 50% of which was applied for counseling on diabetes complications and their preventions.  I advised patient to maintain close follow up with their PCP for primary care needs.  Patient is asked to bring meter and  blood glucose logs during their next visit.   Follow up plan: Return in about 3 months (around 03/14/2016) for follow up with pre-visit labs, meter, and logs.  Marquis Lunch, MD Phone: [phone]  Fax: [phone]   12/13/2015, 9:09 AM

## 2015-12-16 ENCOUNTER — Ambulatory Visit: Payer: Medicare Other | Admitting: Neurology

## 2016-01-13 ENCOUNTER — Encounter: Payer: Self-pay | Admitting: Neurology

## 2016-01-13 ENCOUNTER — Ambulatory Visit (INDEPENDENT_AMBULATORY_CARE_PROVIDER_SITE_OTHER): Payer: Medicare Other | Admitting: Neurology

## 2016-01-13 VITALS — BP 108/62 | HR 86 | Resp 16 | Ht 64.0 in | Wt 236.0 lb

## 2016-01-13 DIAGNOSIS — G35 Multiple sclerosis: Secondary | ICD-10-CM

## 2016-01-13 DIAGNOSIS — I10 Essential (primary) hypertension: Secondary | ICD-10-CM | POA: Diagnosis not present

## 2016-01-13 DIAGNOSIS — E7800 Pure hypercholesterolemia, unspecified: Secondary | ICD-10-CM | POA: Diagnosis not present

## 2016-01-13 DIAGNOSIS — R269 Unspecified abnormalities of gait and mobility: Secondary | ICD-10-CM

## 2016-01-13 DIAGNOSIS — E119 Type 2 diabetes mellitus without complications: Secondary | ICD-10-CM | POA: Diagnosis not present

## 2016-01-13 MED ORDER — OCRELIZUMAB 300 MG/10ML IV SOLN: 300.0000 mg | Freq: Once | INTRAVENOUS | 1 refills | Status: AC

## 2016-01-13 NOTE — Patient Instructions (Signed)
OCREVUS material given, hep , TB and JCV checked and not positive.

## 2016-01-13 NOTE — Progress Notes (Signed)
[facility]  Provider:  Melvyn Novas, M D  Referring Provider: [name], MD Primary Care Physician:  [name], MD  Chief Complaint  Patient presents with  . Follow-up    Rm 10. No new concerns today     HPI:  [name] is a 60 y.o. female  Is seen here as a revisit  from Dr. Sherril Croon for multiple sclerosis follow up.   The patient had followed [name] for over 20 years , diagnosed since 1989 ,, and is now mainly seen by [name], NP. She has never been on another medication, but Betaseron.   In my last visit , 2013 , I suggested Ampyra to her , which we than initiated but had to D/C after her kidney function changed.  The patient is diabetic, diagnosed in 2008. She just changed in 2014 to insulin.  She remains on Betaseron. Has been gaining weight and has had a fall last December. Uses a Rollator when she is outdoors but indoors a walking stick or cane, but more and more frequently the walker. .   She believes her sleep is poor due to spasms and nocturia, she may snore. Still wakes with a dry mouth, has been told she snores. Sleeps prone. The patient underwent a sleep study I believe in 2014, but was unable to tolerate the CPAP due to her sleeping habit. She has a chief complaint today of cramping pain in the left leg, spasm, at the groin and right above the hip. No radiation , no burning and no deep ache - just spams. Onset 3 weeks ago.  Although, she has taken baclofen po now tid  for a long time , for the " MS hug" - it had an effect only on abdominal spasms. On the same dose, it has not affected the hip spasms.  She believes her sleep is poor due to spasms and nocturia, she may snore. Wakes with a dry mouth, has been told she snores. Sleeps prone.  The spasms in the left hip are preceded by sitting in certain low chairs in [employer] , her volunteer work place.  Dr. Sherril Croon  recently did some routine Labs, that are not available on EPIC.    Interval  history from 09/17/2015, [name] is seen here today after her May 2017 first visit with me. She had an excellent comprehensive metabolic panel result no abnormalities of liver or kidney function. She tends to have higher potassium levels but she was also not diluting properly, meaning the patient was probably dehydrated to some degree at the time of the blood draw. I also checked her for JC virus and it was -0.21 JC antibody is considered negative or indeterminate. Theoretically, this patient could be using an oral medication for the treatment of MS and many of these are stronger than the injectable interferons. However her MRI from recent has shown no acute lesions and her history doesn't really speak of recent relapses. 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 I will add the necessary labs today which are also including a hepatitis panel and a gold blood test for tuberculosis. If these are negative I will really prefer her to have an infusion therapy. 09-17-2015  35 minute re-Assessment with ore than 50% of the time dedicated to face to face discussion and coordination of care. :   Originally MS  relapsing - remitting , but now  without relapse over 10 years . May enter the  progressive phase of the disease.  She will remain on Betaseron, not oral medication. 20 years .  She was not doing well on AMPYRA, her renal function declined..  Fatigue due to MS or sleepiness from nocturia and apnea?  Sleep study revealed mild AHI, tried CPAP but couldn't tolerate the sleep position. She sleeps usually prone/  Baclofen now 10 mg tid. Spasm are most bother some at night. She has refills.   PT re - evaluation , fall prevention to be re- discussed.   She uses a walker  .   Her urologist at [facility] - she sees Dr. Patsi Searsannenbaum.  CMET was normal, JCV indeterminate, MRI without acute brain lesions.   I have  the pleasure of seeing [name] today on 01/13/2016, who has not had any clinical symptoms of relapse since last seen in August 2017. She reports memory deficits however. Dr. Epimenio FootSater had read her MRI and advised me in regards to possible starting OCREVUS. Her viral panel, hepatitis panel were all in normal limits. She would be a candidate. The question is if her disease will progress further or if percreta was could stop it. She may have ended secondary progressive MS at this point. She is currently on Betaseron , for about 20 years.  She will continue BETASERON, until OCREVUS will be approved. She is very interested.      Review of Systems: Out of a complete 14 system review, the patient complains of only the following symptoms, and all other reviewed systems are negative.  Hip spasm, pain , non radiating , weight gain, fatigue,  Gait disorder, falls , urinary incontinence, nocturia.    Social History   Social History  . Marital status: Single    Spouse name: N/A  . Number of children: 0  . Years of education: college   Occupational History  . disabled Not Employed   Social History Main Topics  . Smoking status: Never Smoker  . Smokeless tobacco: Never Used  . Alcohol use No     Comment: quit in 1975  . Drug use: No  . Sexual activity: No   Other Topics Concern  . Not on file   Social History Narrative   Patient is single and lives alone.   Patient is right-handed.   Patient has a college education.   Patient does not drink any caffeine.   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    Family History  Problem Relation Age of Onset  . Heart attack Mother   . Diabetes Mother   . Aneurysm Father   . Heart disease Father   . Cancer Maternal Grandmother   . Colon cancer Neg Hx     Past Medical History:  Diagnosis Date  . Diabetes mellitus without complication (HCC)   . High cholesterol   . Hypertension   . Hypothyroidism   . Morbid obesity (HCC)   . MS  (multiple sclerosis) (HCC) relapsing remitting  . Severe obesity (BMI >= 40) (HCC) 10/11/2012    Past Surgical History:  Procedure Laterality Date  . BIOPSY  03/19/2015   Procedure: BIOPSY;  Surgeon: West BaliSandi L Fields, MD;  Location: [facility];  Service: Endoscopy;;  random colon biopsy  . CHOLECYSTECTOMY  8/09  . COLONOSCOPY     about 7 years  . COLONOSCOPY WITH PROPOFOL N/A 03/19/2015   [id number]:normal ileum and colon/small internal hemorrhoids  .  LEFT OOPHORECTOMY Left 8/11    Current Outpatient Prescriptions  Medication Sig Dispense Refill  . aspirin 81 MG tablet Take 81 mg by mouth daily.    Marland Kitchen atorvastatin (LIPITOR) 40 MG tablet Take 40 mg by mouth daily.     . baclofen (LIORESAL) 10 MG tablet TAKE 1 TABLET THREE TIMES A DAY 270 tablet 1  . ergocalciferol (VITAMIN D2) 50000 UNITS capsule Take 50,000 Units by mouth once a week. Takes on Thursdays.    Marland Kitchen imipramine (TOFRANIL) 25 MG tablet TAKE 1 TABLET AT BEDTIME 90 tablet 0  . interferon beta-1b (BETASERON) 0.3 MG injection Inject 0.3 mg into the skin every other day.    Marland Kitchen LANTUS SOLOSTAR 100 UNIT/ML SOPN Inject 50 Units into the skin at bedtime.    Marland Kitchen levothyroxine (SYNTHROID, LEVOTHROID) 75 MCG tablet Take 1 tablet (75 mcg total) by mouth daily before breakfast. 30 tablet 3  . metFORMIN (GLUCOPHAGE-XR) 500 MG 24 hr tablet Take 500 mg by mouth 2 (two) times daily.     Marland Kitchen RELION PEN NEEDLES 32G X 4 MM MISC     . Tamsulosin HCl (FLOMAX) 0.4 MG CAPS Take 0.4 mg by mouth daily after breakfast.     . telmisartan (MICARDIS) 20 MG tablet Take 20 mg by mouth daily.     Marland Kitchen tolterodine (DETROL LA) 4 MG 24 hr capsule TAKE 1 CAPSULE DAILY 90 capsule 3  . TOPROL XL 25 MG 24 hr tablet Take 25 mg by mouth daily.     . TRULICITY 1.5 MG/0.5ML SOPN INJECT 1.5 MG INTO THE SKIN ONCE A WEEK 2 mL 2   No current facility-administered medications for this visit.     Allergies as of 01/13/2016 - Review Complete 01/13/2016  Allergen Reaction Noted  .  Penicillins Anaphylaxis 10/07/2011  . Lotensin [benazepril hcl] Cough 03/11/2015   patient lost 50 pounds since 2016 .  Vitals: BP 108/62   Pulse 86   Resp 16   Ht 5\' 4"  (1.626 m)   Wt 236 lb (107 kg)   BMI 40.51 kg/m  Last Weight:  Wt Readings from Last 1 Encounters:  01/13/16 236 lb (107 kg)   Last Height:   Ht Readings from Last 1 Encounters:  01/13/16 5\' 4"  (1.626 m)    Physical exam:  General: The patient is awake, alert and appears not in acute distress. The patient is well groomed. She is right handed. Head: Normocephalic, atraumatic.  Neck is supple. Mallampati 3 , neck circumference: 15 inches , no retrognathia, no retainers, no TMJ, no nasal deviation  Cardiovascular:  Regular rate and rhythm, without  murmurs or carotid bruit, and without distended neck veins. Respiratory: Lungs are clear to auscultation. Skin:  Without evidence of edema, or rash Trunk: BMI is elevated .  Neurologic exam : The patient is awake and alert, oriented to place and time.   Memory subjective described as impaired- slower , some word finding . Her speech is mildly dysarthric.     There is a normal attention span & concentration ability. Speech is fluent without  dysarthria, dysphonia- Mood and affect are appropriate.  No flowsheet data found. MOCA   Cranial nerves:  Smell and taste is preserved. Pupils are equal and briskly reactive to light.  Funduscopic exam without  evidence of pallor or edema. No history of optic neuritis. Extraocular movements  in vertical and horizontal planes intact and without nystagmus. Visual fields by finger perimetry are intact. Hearing to finger rub intact.  Facial sensation intact to fine touch. Facial motor strength is symmetric and tongue and uvula move midline. Motor exam: Normal tone and normal muscle bulk and symmetric normal strength in all extremities. Sensory:  Fine touch, pinprick and vibration were tested in all extremities. Proprioception in  upper extremities is normal. Coordination: Finger-to-nose maneuver tested and normal without evidence of ataxia, dysmetria or tremor.  Reports rght leg coordination difficulties, but not handwriting changes.  The patient has trouble lifting the right foot and adducting/ flexing the right hip.  She places her left leg first on the stool before she tries to sit up at the exam table, She is unable to turn, unable to rise without bracing herself,  Patient walks with assistive device, a walker with built in seat  She reports not using the seat much.  Deep tendon reflexes: in the  upper and lower extremities are  Brisk .   Right handed , with brisker reflexes on the right.  Babinski maneuver response is up going right, and down going left .   She will read up on OCREVUS and is interested in starting in the new year.  She is interested in a less frequent dosing of medication. HEPATITIS, TB test and JCV test were negative.   Memory was tested.  MOCA 23/ 30      PS MRI report 2017 .  [facility] [address], [address] [place], [place] [address] [phone]  NEUROIMAGING REPORT EXAM: MRI Brain without contrast STUDY DATE: 09/03/2014 PATIENT NAME: [name] DOB: 12/26/1955 MRN: [id number]   ORDERING CLINICIAN: Nilda Riggs, NP CLINICAL HISTORY: 60 year old woman with multiple sclerosis COMPARISON FILMS: None available  IMAGING SITE: [facility], [address] [place], [place] FINDINGS: On sagittal images, the spinal cord is imaged caudally to C3-C4 and is normal in caliber.   The contents of the posterior fossa are of normal size and position.   The pituitary gland and optic chiasm appear normal.    Brain volume appears normal.   The ventricles are normal in size and without distortion.  There are no abnormal extra-axial collections of fluid.   The brainstem appears normal.  There is a small T2/FLAIR hyperintense focus in the right cerebellar  hemisphere. The deep gray matter appears normal.  In the hemispheres, there are multiple T2/FLAIR hyperintense foci in the periventricular, deep and juxtacortical white matter. Many of the periventricular foci are radially oriented to the ventricles. Some of the foci are hypointense on T1-weighted images. The orbits appear normal.   The VIIth/VIIIth nerve complex appears normal.  The mastoid air cells appear normal.  The paranasal sinuses appear normal.  Flow voids are identified within the major intracerebral arteries.    Diffusion weighted images are normal.  Susceptibility weighted images are normal.    IMPRESSION:  This is an abnormal MRI of the brain without contrast showing T2/FLAIR hyperintense foci in both hemispheres in the right cerebellar hemisphere in a pattern consistent with the diagnosis of multiple sclerosis. The study was performed without contrast and prior films are not available so the acuity of the foci are difficult to judge. However, the diffusion-weighted images did not show any obviously acute lesions.   INTERPRETING PHYSICIAN: Richard A. Epimenio Foot, MD, PhD Certified in  Neuroimaging by American Society of Neuroimaging     PLAN : RV in 4-6 weeks with NP or me / starting OCREVUS.   [name] Mccaffrey, MD 01-13-2016

## 2016-01-14 LAB — COMPREHENSIVE METABOLIC PANEL
ALBUMIN: 4.1 g/dL (ref 3.5–5.5)
ALK PHOS: 96 IU/L (ref 39–117)
ALT: 23 IU/L (ref 0–32)
AST: 21 IU/L (ref 0–40)
Albumin/Globulin Ratio: 1.4 (ref 1.2–2.2)
BILIRUBIN TOTAL: 0.3 mg/dL (ref 0.0–1.2)
BUN / CREAT RATIO: 29 — AB (ref 9–23)
BUN: 36 mg/dL — AB (ref 6–24)
CHLORIDE: 100 mmol/L (ref 96–106)
CO2: 21 mmol/L (ref 18–29)
CREATININE: 1.24 mg/dL — AB (ref 0.57–1.00)
Calcium: 9.4 mg/dL (ref 8.7–10.2)
GFR calc non Af Amer: 48 mL/min/{1.73_m2} — ABNORMAL LOW (ref >59)
GFR, EST AFRICAN AMERICAN: 55 mL/min/{1.73_m2} — AB (ref >59)
GLOBULIN, TOTAL: 2.9 g/dL (ref 1.5–4.5)
Glucose: 164 mg/dL — ABNORMAL HIGH (ref 65–99)
Potassium: 5.2 mmol/L (ref 3.5–5.2)
SODIUM: 138 mmol/L (ref 134–144)
TOTAL PROTEIN: 7 g/dL (ref 6.0–8.5)

## 2016-01-14 LAB — CBC WITH DIFFERENTIAL/PLATELET
BASOS: 0 %
Basophils Absolute: 0 10*3/uL (ref 0.0–0.2)
EOS (ABSOLUTE): 0.2 10*3/uL (ref 0.0–0.4)
EOS: 3 %
HEMATOCRIT: 37.3 % (ref 34.0–46.6)
HEMOGLOBIN: 12.1 g/dL (ref 11.1–15.9)
IMMATURE GRANS (ABS): 0 10*3/uL (ref 0.0–0.1)
Immature Granulocytes: 0 %
LYMPHS ABS: 1 10*3/uL (ref 0.7–3.1)
LYMPHS: 15 %
MCH: 26.6 pg (ref 26.6–33.0)
MCHC: 32.4 g/dL (ref 31.5–35.7)
MCV: 82 fL (ref 79–97)
MONOCYTES: 9 %
Monocytes Absolute: 0.7 10*3/uL (ref 0.1–0.9)
NEUTROS ABS: 5.2 10*3/uL (ref 1.4–7.0)
Neutrophils: 73 %
Platelets: 289 10*3/uL (ref 150–379)
RBC: 4.55 x10E6/uL (ref 3.77–5.28)
RDW: 15.1 % (ref 12.3–15.4)
WBC: 7 10*3/uL (ref 3.4–10.8)

## 2016-01-15 ENCOUNTER — Telehealth: Payer: Self-pay

## 2016-01-15 ENCOUNTER — Ambulatory Visit (INDEPENDENT_AMBULATORY_CARE_PROVIDER_SITE_OTHER): Payer: Medicare Other | Admitting: Urology

## 2016-01-15 DIAGNOSIS — N3020 Other chronic cystitis without hematuria: Secondary | ICD-10-CM | POA: Diagnosis not present

## 2016-01-15 DIAGNOSIS — R351 Nocturia: Secondary | ICD-10-CM

## 2016-01-15 NOTE — Telephone Encounter (Signed)
-----   Message from Melvyn Novas, MD sent at 01/14/2016  4:59 PM EST ----- CBC is All normal !!! GOOD . Patient can go to ocrevus. She has a rising creatinine and BUN, encourage hydration and we will be careful with contrast studies

## 2016-01-15 NOTE — Telephone Encounter (Signed)
LM with results and recommendations below. Left call back number for any further questions.

## 2016-01-30 ENCOUNTER — Telehealth: Payer: Self-pay

## 2016-01-30 NOTE — Telephone Encounter (Signed)
Received notice from [employer] [place] [employer] that pt has been enrolled in the Betaseron program at no cost until 01/25/2017. ID: [id number]

## 2016-01-31 ENCOUNTER — Other Ambulatory Visit: Payer: Self-pay | Admitting: Neurology

## 2016-02-03 DIAGNOSIS — I10 Essential (primary) hypertension: Secondary | ICD-10-CM | POA: Diagnosis not present

## 2016-02-03 DIAGNOSIS — E7800 Pure hypercholesterolemia, unspecified: Secondary | ICD-10-CM | POA: Diagnosis not present

## 2016-02-03 DIAGNOSIS — E119 Type 2 diabetes mellitus without complications: Secondary | ICD-10-CM | POA: Diagnosis not present

## 2016-02-06 ENCOUNTER — Other Ambulatory Visit: Payer: Self-pay | Admitting: "Endocrinology

## 2016-02-17 ENCOUNTER — Telehealth: Payer: Self-pay | Admitting: *Deleted

## 2016-02-17 ENCOUNTER — Ambulatory Visit: Payer: Medicare Other | Admitting: Nurse Practitioner

## 2016-02-17 NOTE — Telephone Encounter (Signed)
No show - canceled morning of follow up appt.  Stated she did not want to try a new medication for her MS.

## 2016-02-18 ENCOUNTER — Encounter: Payer: Self-pay | Admitting: Nurse Practitioner

## 2016-02-19 ENCOUNTER — Telehealth: Payer: Self-pay

## 2016-02-19 NOTE — Telephone Encounter (Signed)
Received a message from [place], [place] in [employer]. When attempting authorization on ocrevus, pt told [facility] that she wanted to hold off on ocrevus.

## 2016-03-11 DIAGNOSIS — Z299 Encounter for prophylactic measures, unspecified: Secondary | ICD-10-CM | POA: Diagnosis not present

## 2016-03-11 DIAGNOSIS — I10 Essential (primary) hypertension: Secondary | ICD-10-CM | POA: Diagnosis not present

## 2016-03-11 DIAGNOSIS — E1165 Type 2 diabetes mellitus with hyperglycemia: Secondary | ICD-10-CM | POA: Diagnosis not present

## 2016-03-11 DIAGNOSIS — G35 Multiple sclerosis: Secondary | ICD-10-CM | POA: Diagnosis not present

## 2016-03-11 DIAGNOSIS — Z789 Other specified health status: Secondary | ICD-10-CM | POA: Diagnosis not present

## 2016-03-11 DIAGNOSIS — Z713 Dietary counseling and surveillance: Secondary | ICD-10-CM | POA: Diagnosis not present

## 2016-03-17 ENCOUNTER — Other Ambulatory Visit: Payer: Self-pay | Admitting: "Endocrinology

## 2016-03-17 DIAGNOSIS — E782 Mixed hyperlipidemia: Secondary | ICD-10-CM | POA: Diagnosis not present

## 2016-03-17 DIAGNOSIS — E1122 Type 2 diabetes mellitus with diabetic chronic kidney disease: Secondary | ICD-10-CM | POA: Diagnosis not present

## 2016-03-17 DIAGNOSIS — Z794 Long term (current) use of insulin: Secondary | ICD-10-CM | POA: Diagnosis not present

## 2016-03-17 DIAGNOSIS — N182 Chronic kidney disease, stage 2 (mild): Secondary | ICD-10-CM | POA: Diagnosis not present

## 2016-03-17 DIAGNOSIS — E039 Hypothyroidism, unspecified: Secondary | ICD-10-CM | POA: Diagnosis not present

## 2016-03-17 LAB — LIPID PANEL
CHOL/HDL RATIO: 2.7 ratio (ref <5.0)
Cholesterol: 148 mg/dL (ref <200)
HDL: 55 mg/dL (ref >50)
LDL CALC: 70 mg/dL (ref <100)
Triglycerides: 117 mg/dL (ref <150)
VLDL: 23 mg/dL (ref <30)

## 2016-03-17 LAB — COMPREHENSIVE METABOLIC PANEL
ALT: 19 U/L (ref 6–29)
AST: 17 U/L (ref 10–35)
Albumin: 3.5 g/dL — ABNORMAL LOW (ref 3.6–5.1)
Alkaline Phosphatase: 87 U/L (ref 33–130)
BUN: 24 mg/dL (ref 7–25)
CHLORIDE: 107 mmol/L (ref 98–110)
CO2: 25 mmol/L (ref 20–31)
CREATININE: 0.98 mg/dL (ref 0.50–1.05)
Calcium: 8.8 mg/dL (ref 8.6–10.4)
Glucose, Bld: 119 mg/dL — ABNORMAL HIGH (ref 65–99)
POTASSIUM: 5.1 mmol/L (ref 3.5–5.3)
SODIUM: 141 mmol/L (ref 135–146)
Total Bilirubin: 0.3 mg/dL (ref 0.2–1.2)
Total Protein: 6.5 g/dL (ref 6.1–8.1)

## 2016-03-17 LAB — T4, FREE: FREE T4: 1.3 ng/dL (ref 0.8–1.8)

## 2016-03-17 LAB — TSH: TSH: 1.55 mIU/L

## 2016-03-18 ENCOUNTER — Other Ambulatory Visit: Payer: Self-pay | Admitting: Neurology

## 2016-03-18 DIAGNOSIS — H5213 Myopia, bilateral: Secondary | ICD-10-CM | POA: Diagnosis not present

## 2016-03-18 DIAGNOSIS — H524 Presbyopia: Secondary | ICD-10-CM | POA: Diagnosis not present

## 2016-03-18 DIAGNOSIS — E119 Type 2 diabetes mellitus without complications: Secondary | ICD-10-CM | POA: Diagnosis not present

## 2016-03-18 DIAGNOSIS — Z794 Long term (current) use of insulin: Secondary | ICD-10-CM | POA: Diagnosis not present

## 2016-03-18 DIAGNOSIS — H52203 Unspecified astigmatism, bilateral: Secondary | ICD-10-CM | POA: Diagnosis not present

## 2016-03-18 LAB — HEMOGLOBIN A1C
HEMOGLOBIN A1C: 7.6 % — AB (ref <5.7)
Mean Plasma Glucose: 171 mg/dL

## 2016-03-20 ENCOUNTER — Ambulatory Visit (INDEPENDENT_AMBULATORY_CARE_PROVIDER_SITE_OTHER): Payer: Medicare Other | Admitting: "Endocrinology

## 2016-03-20 ENCOUNTER — Encounter: Payer: Self-pay | Admitting: "Endocrinology

## 2016-03-20 VITALS — BP 125/81 | HR 87 | Ht 64.0 in | Wt 245.0 lb

## 2016-03-20 DIAGNOSIS — E039 Hypothyroidism, unspecified: Secondary | ICD-10-CM | POA: Diagnosis not present

## 2016-03-20 DIAGNOSIS — N182 Chronic kidney disease, stage 2 (mild): Secondary | ICD-10-CM | POA: Diagnosis not present

## 2016-03-20 DIAGNOSIS — I10 Essential (primary) hypertension: Secondary | ICD-10-CM | POA: Diagnosis not present

## 2016-03-20 DIAGNOSIS — E1122 Type 2 diabetes mellitus with diabetic chronic kidney disease: Secondary | ICD-10-CM | POA: Diagnosis not present

## 2016-03-20 DIAGNOSIS — Z794 Long term (current) use of insulin: Secondary | ICD-10-CM | POA: Diagnosis not present

## 2016-03-20 DIAGNOSIS — E782 Mixed hyperlipidemia: Secondary | ICD-10-CM

## 2016-03-20 MED ORDER — LEVOTHYROXINE SODIUM 75 MCG PO TABS: 75.0000 ug | ORAL_TABLET | Freq: Every day | ORAL | 6 refills | Status: DC

## 2016-03-20 NOTE — Progress Notes (Signed)
Subjective:    Patient ID: [name], female    DOB: 1955/09/11,  [name], MD   Past Medical History:  Diagnosis Date  . Diabetes mellitus without complication (HCC)   . High cholesterol   . Hypertension   . Hypothyroidism   . Morbid obesity (HCC)   . MS (multiple sclerosis) (HCC) relapsing remitting  . Severe obesity (BMI >= 40) (HCC) 10/11/2012   Past Surgical History:  Procedure Laterality Date  . BIOPSY  03/19/2015   Procedure: BIOPSY;  Surgeon: West Bali, MD;  Location: [facility];  Service: Endoscopy;;  random colon biopsy  . CHOLECYSTECTOMY  8/09  . COLONOSCOPY     about 7 years  . COLONOSCOPY WITH PROPOFOL N/A 03/19/2015   [id number] ileum and colon/small internal hemorrhoids  . LEFT OOPHORECTOMY Left 8/11   Social History   Social History  . Marital status: Single    Spouse name: N/A  . Number of children: 0  . Years of education: college   Occupational History  . disabled Not Employed   Social History Main Topics  . Smoking status: Never Smoker  . Smokeless tobacco: Never Used  . Alcohol use No     Comment: quit in 1975  . Drug use: No  . Sexual activity: No   Other Topics Concern  . None   Social History Narrative   Patient is single and lives alone.   Patient is right-handed.   Patient has a college education.   Patient does not drink any caffeine.   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   Outpatient Encounter Prescriptions as of 03/20/2016  Medication Sig  . aspirin 81 MG tablet Take 81 mg by mouth daily.  Marland Kitchen atorvastatin (LIPITOR) 40 MG tablet Take 40 mg by mouth daily.   . baclofen (LIORESAL) 10 MG tablet TAKE 1 TABLET THREE TIMES A DAY  . ergocalciferol (VITAMIN D2) 50000 UNITS capsule Take 50,000 Units by mouth once a week. Takes on Thursdays.  Marland Kitchen imipramine (TOFRANIL) 25 MG tablet TAKE 1 TABLET AT BEDTIME  . interferon beta-1b (BETASERON) 0.3 MG injection Inject 0.3 mg into the skin every other day.  Marland Kitchen  LANTUS SOLOSTAR 100 UNIT/ML SOPN Inject 50 Units into the skin at bedtime.  Marland Kitchen levothyroxine (SYNTHROID, LEVOTHROID) 75 MCG tablet Take 1 tablet (75 mcg total) by mouth daily before breakfast.  . metFORMIN (GLUCOPHAGE-XR) 500 MG 24 hr tablet Take 500 mg by mouth 2 (two) times daily.   Marland Kitchen RELION PEN NEEDLES 32G X 4 MM MISC   . Tamsulosin HCl (FLOMAX) 0.4 MG CAPS Take 0.4 mg by mouth daily after breakfast.   . telmisartan (MICARDIS) 20 MG tablet Take 20 mg by mouth daily.   Marland Kitchen tolterodine (DETROL LA) 4 MG 24 hr capsule TAKE 1 CAPSULE DAILY  . TOPROL XL 25 MG 24 hr tablet Take 25 mg by mouth daily.   . TRULICITY 1.5 MG/0.5ML SOPN INJECT 1.5 MG INTO THE SKIN ONCE A WEEK  . [DISCONTINUED] levothyroxine (SYNTHROID, LEVOTHROID) 50 MCG tablet TAKE 1 TABLET EVERY MORNING  . [DISCONTINUED] levothyroxine (SYNTHROID, LEVOTHROID) 75 MCG tablet Take 1 tablet (75 mcg total) by mouth daily before breakfast.   No facility-administered encounter medications on file as of 03/20/2016.    ALLERGIES: Allergies  Allergen Reactions  . Penicillins Anaphylaxis    Has patient had a PCN reaction causing immediate rash, facial/tongue/throat swelling, SOB or lightheadedness with hypotension: Yes Has patient had a  PCN reaction causing severe rash involving mucus membranes or skin necrosis: No Has patient had a PCN reaction that required hospitalization No Has patient had a PCN reaction occurring within the last 10 years: No If all of the above answers are "NO", then may proceed with Cephalosporin use.   Marland Kitchen Lotensin [Benazepril Hcl] Cough   VACCINATION STATUS:  There is no immunization history on file for this patient.  Diabetes  She presents for her follow-up diabetic visit. She has type 2 diabetes mellitus. Onset time: She was diagnosed at approximate age of 52 years. Her disease course has been improving. There are no hypoglycemic associated symptoms. Pertinent negatives for hypoglycemia include no confusion,  headaches, pallor or seizures. Associated symptoms include fatigue. Pertinent negatives for diabetes include no chest pain, no polydipsia, no polyphagia and no polyuria. There are no hypoglycemic complications. Symptoms are improving. Diabetic complications include nephropathy and peripheral neuropathy. Risk factors for coronary artery disease include diabetes mellitus, dyslipidemia, hypertension and sedentary lifestyle. Current diabetic treatment includes insulin injections and oral agent (monotherapy). She is compliant with treatment most of the time. Her weight is stable. She is following a generally unhealthy diet. She has had a previous visit with a dietitian. She never participates in exercise. Her home blood glucose trend is decreasing steadily. Her breakfast blood glucose range is generally 130-140 mg/dl. Her overall blood glucose range is 130-140 mg/dl. An ACE inhibitor/angiotensin II receptor blocker is being taken.  Thyroid Problem  Presents for follow-up visit. Symptoms include fatigue. Patient reports no cold intolerance, diarrhea, heat intolerance or palpitations. The symptoms have been improving. Past treatments include levothyroxine. Her past medical history is significant for hyperlipidemia.  Hyperlipidemia  This is a chronic problem. The current episode started more than 1 year ago. Pertinent negatives include no chest pain, myalgias or shortness of breath. Current antihyperlipidemic treatment includes statins.  Hypertension  This is a chronic problem. The current episode started more than 1 year ago. Pertinent negatives include no chest pain, headaches, palpitations or shortness of breath. Past treatments include angiotensin blockers. Identifiable causes of hypertension include a thyroid problem.    Review of Systems  Constitutional: Positive for fatigue. Negative for unexpected weight change.  HENT: Negative for trouble swallowing and voice change.   Eyes: Negative for visual  disturbance.  Respiratory: Negative for cough, shortness of breath and wheezing.   Cardiovascular: Negative for chest pain, palpitations and leg swelling.  Gastrointestinal: Negative for diarrhea, nausea and vomiting.  Endocrine: Negative for cold intolerance, heat intolerance, polydipsia, polyphagia and polyuria.  Musculoskeletal: Negative for arthralgias and myalgias.  Skin: Negative for color change, pallor, rash and wound.  Neurological: Negative for seizures and headaches.  Psychiatric/Behavioral: Negative for confusion and suicidal ideas.    Objective:    BP 125/81   Pulse 87   Ht 5\' 4"  (1.626 m)   Wt 245 lb (111.1 kg)   BMI 42.05 kg/m   Wt Readings from Last 3 Encounters:  03/20/16 245 lb (111.1 kg)  01/13/16 236 lb (107 kg)  12/13/15 241 lb (109.3 kg)    Physical Exam  Constitutional: She is oriented to person, place, and time. She appears well-developed.  HENT:  Head: Normocephalic and atraumatic.  Eyes: EOM are normal.  Neck: Normal range of motion. Neck supple. No tracheal deviation present. No thyromegaly present.  Cardiovascular: Normal rate and regular rhythm.   Pulmonary/Chest: Effort normal and breath sounds normal.  Abdominal: Soft. Bowel sounds are normal. There is no tenderness. There is no  guarding.  Musculoskeletal: She exhibits no edema.  She walks with her walker due to multiple sclerosis.  Neurological: She is alert and oriented to person, place, and time. She has normal reflexes. No cranial nerve deficit. Coordination normal.  Skin: Skin is warm and dry. No rash noted. No erythema. No pallor.  Psychiatric: She has a normal mood and affect. Judgment normal.    Results for orders placed or performed in visit on 03/17/16  Comprehensive metabolic panel  Result Value Ref Range   Sodium 141 135 - 146 mmol/L   Potassium 5.1 3.5 - 5.3 mmol/L   Chloride 107 98 - 110 mmol/L   CO2 25 20 - 31 mmol/L   Glucose, Bld 119 (H) 65 - 99 mg/dL   BUN 24 7 - 25  mg/dL   Creat 1.47 8.29 - 5.62 mg/dL   Total Bilirubin 0.3 0.2 - 1.2 mg/dL   Alkaline Phosphatase 87 33 - 130 U/L   AST 17 10 - 35 U/L   ALT 19 6 - 29 U/L   Total Protein 6.5 6.1 - 8.1 g/dL   Albumin 3.5 (L) 3.6 - 5.1 g/dL   Calcium 8.8 8.6 - 13.0 mg/dL  Lipid panel  Result Value Ref Range   Cholesterol 148 <200 mg/dL   Triglycerides 865 <784 mg/dL   HDL 55 >69 mg/dL   Total CHOL/HDL Ratio 2.7 <5.0 Ratio   VLDL 23 <30 mg/dL   LDL Cholesterol 70 <629 mg/dL  TSH  Result Value Ref Range   TSH 1.55 mIU/L  T4, free  Result Value Ref Range   Free T4 1.3 0.8 - 1.8 ng/dL  Hemoglobin B2W  Result Value Ref Range   Hgb A1c MFr Bld 7.6 (H) <5.7 %   Mean Plasma Glucose 171 mg/dL   Complete Blood Count (Most recent): Lab Results  Component Value Date   WBC 7.0 01/13/2016   HGB 11.4 (L) 03/05/2015   HCT 37.3 01/13/2016   MCV 82 01/13/2016   PLT 289 01/13/2016   Chemistry (most recent): Lab Results  Component Value Date   NA 141 03/17/2016   K 5.1 03/17/2016   CL 107 03/17/2016   CO2 25 03/17/2016   BUN 24 03/17/2016   CREATININE 0.98 03/17/2016   Diabetic Labs (most recent): Lab Results  Component Value Date   HGBA1C 7.6 (H) 03/17/2016   HGBA1C 8.2 (H) 12/10/2015   HGBA1C 8.6 (A) 11/28/2014   Lipid profile (most recent): Lab Results  Component Value Date   TRIG 117 03/17/2016   CHOL 148 03/17/2016         Assessment & Plan:   1. Type 2 diabetes mellitus with stage 2 chronic kidney disease, with long-term current use of insulin (HCC)  Her diabetes is  complicated by CK D and patient remains at a high risk for more acute and chronic complications of diabetes which include CAD, CVA, CKD, retinopathy, and neuropathy. These are all discussed in detail with the patient.  Patient came with controlled glucose profile, recent A1c is  7.6% improved  from 8.6 %.  She did not bring her meter and logs for visit today. Recent labs reviewed.   - I have re-counseled the  patient on diet management and weight loss  by adopting a carbohydrate restricted / protein rich  Diet.  - Suggestion is made for patient to avoid simple carbohydrates   from their diet including Cakes , Desserts, Ice Cream,  Soda (  diet and regular) , Sweet Tea ,  Candies,  Chips, Cookies, Artificial Sweeteners,   and "Sugar-free" Products .  This will help patient to have stable blood glucose profile and potentially avoid unintended  Weight gain.  - Patient is advised to stick to a routine mealtimes to eat 3 meals  a day and avoid unnecessary snacks ( to snack only to correct hypoglycemia).  - The patient  Has been  scheduled with Norm Salt, RDN, CDE for individualized DM education.  - I have approached patient with the following individualized plan to manage diabetes and patient agrees.  -I will continue Lantus 50 units QHS, associated with monitoring of glucose QAM, and when necessary. -She will not need prandial insulin for now .  -Patient is encouraged to call clinic for blood glucose levels less than 70 or above 300 mg /dl. -For better insulin sensitivity I will continue MTF at lower dose of 500mg  po BID. -Due to CKD patient is not a candidate for SGLT2 inhibitors. -Patient will continue Trulicity 1.5mg  weekly. -Target numbers for A1c, LDL, HDL, Triglycerides, Waist Circumference were discussed in detail.   - Patient specific target  for A1c; LDL, HDL, Triglycerides, and  Waist Circumference were discussed in detail.  2) BP/HTN: Controlled. Continue current medications including ACEI/ARB. 3) Lipids/HPL:  continue statins. 4)  Weight/Diet: CDE consult in progress, exercise, and carbohydrates information provided. 5)  Primary hypothyroidism - Her thyroid function tests are consistent with appropriate replacement . I will refill her levothyroxine 75 g by mouth every morning to replace her existing prescription for 50 g.  - We discussed about correct intake of levothyroxine, at  fasting, with water, separated by at least 30 minutes from breakfast, and separated by more than 4 hours from calcium, iron, multivitamins, acid reflux medications (PPIs). -Patient is made aware of the fact that thyroid hormone replacement is needed for life, dose to be adjusted by periodic monitoring of thyroid function tests.  6) Chronic Care/Health Maintenance:  -Patient is on ACEI/ARB and Statin medications and encouraged to continue to follow up with Ophthalmology, Podiatrist at least yearly or according to recommendations, and advised to  stay away from smoking. I have recommended yearly flu vaccine and pneumonia vaccination at least every 5 years; and  sleep for at least 7 hours a day.  - 25 minutes of time was spent on the care of this patient , 50% of which was applied for counseling on diabetes complications and their preventions.  I advised patient to maintain close follow up with their PCP for primary care needs.  Patient is asked to bring meter and  blood glucose logs during their next visit.   Follow up plan: Return in about 4 months (around 07/18/2016) for follow up with pre-visit labs, meter, and logs.  Marquis Lunch, MD Phone: [phone]  Fax: [phone]   03/20/2016, 9:07 AM

## 2016-03-20 NOTE — Patient Instructions (Signed)

## 2016-04-02 ENCOUNTER — Other Ambulatory Visit: Payer: Self-pay | Admitting: "Endocrinology

## 2016-04-15 ENCOUNTER — Ambulatory Visit: Payer: Medicare Other | Admitting: Urology

## 2016-06-13 ENCOUNTER — Other Ambulatory Visit: Payer: Self-pay | Admitting: Neurology

## 2016-06-23 DIAGNOSIS — I10 Essential (primary) hypertension: Secondary | ICD-10-CM | POA: Diagnosis not present

## 2016-06-23 DIAGNOSIS — E119 Type 2 diabetes mellitus without complications: Secondary | ICD-10-CM | POA: Diagnosis not present

## 2016-06-23 DIAGNOSIS — E7800 Pure hypercholesterolemia, unspecified: Secondary | ICD-10-CM | POA: Diagnosis not present

## 2016-06-29 ENCOUNTER — Telehealth: Payer: Self-pay | Admitting: *Deleted

## 2016-06-29 MED ORDER — IMIPRAMINE HCL 25 MG PO TABS: 25.0000 mg | ORAL_TABLET | Freq: Every day | ORAL | 3 refills | Status: DC

## 2016-06-29 NOTE — Telephone Encounter (Signed)
[name], she can see Np in next visit , schedule for July/ august. I will refill her medication.

## 2016-06-29 NOTE — Telephone Encounter (Signed)
Received fax request to refill impramine. Patient cx same day appt on 02/17/16. She did not want to start ocrevus. She has no f/u scheduled. Last received rx 02/04/16 qty 90.

## 2016-06-29 NOTE — Telephone Encounter (Signed)
Called and LVM for pt to call and schedule f/u. Relayed message per CD,MD. Advised in order to continue to receive med refills from our office, she has to come for f/u.  *If MM,NP has no availability, can schedule with CM,NP

## 2016-07-01 NOTE — Telephone Encounter (Signed)
Called and scheduled appt with CM,NP on 07/27/16 at 11:15am. MM,NP had no openings in July or August.  Advised patient to check in at 11am.   Advised CD,MD [facility].

## 2016-07-05 ENCOUNTER — Other Ambulatory Visit: Payer: Self-pay | Admitting: Neurology

## 2016-07-08 DIAGNOSIS — E7800 Pure hypercholesterolemia, unspecified: Secondary | ICD-10-CM | POA: Diagnosis not present

## 2016-07-08 DIAGNOSIS — I10 Essential (primary) hypertension: Secondary | ICD-10-CM | POA: Diagnosis not present

## 2016-07-08 DIAGNOSIS — E119 Type 2 diabetes mellitus without complications: Secondary | ICD-10-CM | POA: Diagnosis not present

## 2016-07-14 ENCOUNTER — Ambulatory Visit: Payer: Medicare Other | Admitting: "Endocrinology

## 2016-07-27 ENCOUNTER — Ambulatory Visit: Payer: Self-pay | Admitting: Nurse Practitioner

## 2016-08-11 ENCOUNTER — Telehealth: Payer: Self-pay | Admitting: Neurology

## 2016-08-11 ENCOUNTER — Ambulatory Visit: Payer: Medicare Other | Admitting: Nurse Practitioner

## 2016-08-11 ENCOUNTER — Telehealth: Payer: Self-pay | Admitting: Nurse Practitioner

## 2016-08-11 NOTE — Telephone Encounter (Signed)
Pt calling to inform that as of today she is out of interferon beta-1b (BETASERON) 0.3 MG injection.  She said that she generally gets it in a 3 month increment but she is completely out, please call

## 2016-08-11 NOTE — Telephone Encounter (Signed)
Discharged as No show 3 times.

## 2016-08-11 NOTE — Telephone Encounter (Signed)
Pt called c/a appt for today. I advised her this was the 3rd no show this year and went over the no show policy. I advised her that another appt could not be scheduled at this time, the provider would make a decision if she could continue to be seen. Pt said ok with no hesitation.

## 2016-08-12 ENCOUNTER — Encounter: Payer: Self-pay | Admitting: Nurse Practitioner

## 2016-08-13 ENCOUNTER — Other Ambulatory Visit: Payer: Self-pay | Admitting: "Endocrinology

## 2016-08-13 ENCOUNTER — Telehealth: Payer: Self-pay | Admitting: "Endocrinology

## 2016-08-13 DIAGNOSIS — Z794 Long term (current) use of insulin: Secondary | ICD-10-CM | POA: Diagnosis not present

## 2016-08-13 DIAGNOSIS — E1122 Type 2 diabetes mellitus with diabetic chronic kidney disease: Secondary | ICD-10-CM | POA: Diagnosis not present

## 2016-08-13 DIAGNOSIS — N182 Chronic kidney disease, stage 2 (mild): Secondary | ICD-10-CM | POA: Diagnosis not present

## 2016-08-13 NOTE — Telephone Encounter (Signed)
Pt notified that she will need to have PCP order this

## 2016-08-13 NOTE — Telephone Encounter (Signed)
[name] is calling stating that she is having UTI symptoms and she is wondering if Dr. Fransico Him would add a Urine Culture to her labs to check it, please advise?

## 2016-08-14 DIAGNOSIS — N390 Urinary tract infection, site not specified: Secondary | ICD-10-CM | POA: Diagnosis not present

## 2016-08-14 DIAGNOSIS — Z6841 Body Mass Index (BMI) 40.0 and over, adult: Secondary | ICD-10-CM | POA: Diagnosis not present

## 2016-08-14 DIAGNOSIS — Z299 Encounter for prophylactic measures, unspecified: Secondary | ICD-10-CM | POA: Diagnosis not present

## 2016-08-14 DIAGNOSIS — Z713 Dietary counseling and surveillance: Secondary | ICD-10-CM | POA: Diagnosis not present

## 2016-08-14 DIAGNOSIS — I10 Essential (primary) hypertension: Secondary | ICD-10-CM | POA: Diagnosis not present

## 2016-08-14 DIAGNOSIS — R350 Frequency of micturition: Secondary | ICD-10-CM | POA: Diagnosis not present

## 2016-08-14 DIAGNOSIS — E039 Hypothyroidism, unspecified: Secondary | ICD-10-CM | POA: Diagnosis not present

## 2016-08-14 DIAGNOSIS — E7800 Pure hypercholesterolemia, unspecified: Secondary | ICD-10-CM | POA: Diagnosis not present

## 2016-08-14 DIAGNOSIS — E1165 Type 2 diabetes mellitus with hyperglycemia: Secondary | ICD-10-CM | POA: Diagnosis not present

## 2016-08-14 DIAGNOSIS — G35 Multiple sclerosis: Secondary | ICD-10-CM | POA: Diagnosis not present

## 2016-08-14 LAB — COMPREHENSIVE METABOLIC PANEL
ALBUMIN: 3.4 g/dL — AB (ref 3.6–5.1)
ALT: 15 U/L (ref 6–29)
AST: 10 U/L (ref 10–35)
Alkaline Phosphatase: 96 U/L (ref 33–130)
BILIRUBIN TOTAL: 0.4 mg/dL (ref 0.2–1.2)
BUN: 24 mg/dL (ref 7–25)
CHLORIDE: 105 mmol/L (ref 98–110)
CO2: 17 mmol/L — AB (ref 20–31)
CREATININE: 1.06 mg/dL — AB (ref 0.50–1.05)
Calcium: 8.9 mg/dL (ref 8.6–10.4)
Glucose, Bld: 243 mg/dL — ABNORMAL HIGH (ref 65–99)
Potassium: 4.9 mmol/L (ref 3.5–5.3)
SODIUM: 138 mmol/L (ref 135–146)
TOTAL PROTEIN: 6.6 g/dL (ref 6.1–8.1)

## 2016-08-14 LAB — HEMOGLOBIN A1C
HEMOGLOBIN A1C: 9.3 % — AB (ref <5.7)
MEAN PLASMA GLUCOSE: 220 mg/dL

## 2016-08-18 ENCOUNTER — Encounter: Payer: Self-pay | Admitting: Neurology

## 2016-08-19 ENCOUNTER — Encounter: Payer: Self-pay | Admitting: "Endocrinology

## 2016-08-19 ENCOUNTER — Other Ambulatory Visit: Payer: Self-pay | Admitting: Neurology

## 2016-08-19 ENCOUNTER — Ambulatory Visit (INDEPENDENT_AMBULATORY_CARE_PROVIDER_SITE_OTHER): Payer: Medicare Other | Admitting: "Endocrinology

## 2016-08-19 VITALS — BP 119/73 | HR 87 | Ht 64.0 in | Wt 238.0 lb

## 2016-08-19 DIAGNOSIS — N182 Chronic kidney disease, stage 2 (mild): Secondary | ICD-10-CM | POA: Diagnosis not present

## 2016-08-19 DIAGNOSIS — E782 Mixed hyperlipidemia: Secondary | ICD-10-CM | POA: Diagnosis not present

## 2016-08-19 DIAGNOSIS — I10 Essential (primary) hypertension: Secondary | ICD-10-CM | POA: Diagnosis not present

## 2016-08-19 DIAGNOSIS — Z794 Long term (current) use of insulin: Secondary | ICD-10-CM

## 2016-08-19 DIAGNOSIS — E1122 Type 2 diabetes mellitus with diabetic chronic kidney disease: Secondary | ICD-10-CM

## 2016-08-19 NOTE — Telephone Encounter (Signed)
I think this has to do with the dismissal and can not be overturned. She will need to make an appointment with a different office ASAP. Marland Kitchen

## 2016-08-19 NOTE — Telephone Encounter (Signed)
Pt called today inquiring about refill for betaseron. I read the letter that was dated yesterday, she understands but wants to know if this can be refilled. Pharmacy: [facility].  She can be reached at [phone]

## 2016-08-19 NOTE — Telephone Encounter (Signed)
patient stated she ran out just 8 days ago- we have to refill for 30 days.

## 2016-08-19 NOTE — Progress Notes (Signed)
Subjective:    Patient ID: [name], female    DOB: April 23, 1955,  [name], MD   Past Medical History:  Diagnosis Date  . Diabetes mellitus without complication (HCC)   . High cholesterol   . Hypertension   . Hypothyroidism   . Morbid obesity (HCC)   . MS (multiple sclerosis) (HCC) relapsing remitting  . Severe obesity (BMI >= 40) (HCC) 10/11/2012   Past Surgical History:  Procedure Laterality Date  . BIOPSY  03/19/2015   Procedure: BIOPSY;  Surgeon: West Bali, MD;  Location: [facility];  Service: Endoscopy;;  random colon biopsy  . CHOLECYSTECTOMY  8/09  . COLONOSCOPY     about 7 years  . COLONOSCOPY WITH PROPOFOL N/A 03/19/2015   [id number] ileum and colon/small internal hemorrhoids  . LEFT OOPHORECTOMY Left 8/11   Social History   Social History  . Marital status: Single    Spouse name: N/A  . Number of children: 0  . Years of education: college   Occupational History  . disabled Not Employed   Social History Main Topics  . Smoking status: Never Smoker  . Smokeless tobacco: Never Used  . Alcohol use No     Comment: quit in 1975  . Drug use: No  . Sexual activity: No   Other Topics Concern  . None   Social History Narrative   Patient is single and lives alone.   Patient is right-handed.   Patient has a college education.   Patient does not drink any caffeine.   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   Outpatient Encounter Prescriptions as of 08/19/2016  Medication Sig  . aspirin 81 MG tablet Take 81 mg by mouth daily.  Marland Kitchen atorvastatin (LIPITOR) 40 MG tablet Take 40 mg by mouth daily.   . baclofen (LIORESAL) 10 MG tablet TAKE 1 TABLET THREE TIMES A DAY  . ergocalciferol (VITAMIN D2) 50000 UNITS capsule Take 50,000 Units by mouth once a week. Takes on Thursdays.  Marland Kitchen imipramine (TOFRANIL) 25 MG tablet Take 1 tablet (25 mg total) by mouth at bedtime.  . interferon beta-1b (BETASERON) 0.3 MG injection Inject 0.3 mg into the  skin every other day.  Marland Kitchen LANTUS SOLOSTAR 100 UNIT/ML SOPN Inject 50 Units into the skin at bedtime.  Marland Kitchen levothyroxine (SYNTHROID, LEVOTHROID) 75 MCG tablet Take 1 tablet (75 mcg total) by mouth daily before breakfast.  . metFORMIN (GLUCOPHAGE-XR) 500 MG 24 hr tablet Take 500 mg by mouth 2 (two) times daily.   Marland Kitchen RELION PEN NEEDLES 32G X 4 MM MISC   . Tamsulosin HCl (FLOMAX) 0.4 MG CAPS Take 0.4 mg by mouth daily after breakfast.   . telmisartan (MICARDIS) 20 MG tablet Take 20 mg by mouth daily.   Marland Kitchen tolterodine (DETROL LA) 4 MG 24 hr capsule TAKE 1 CAPSULE DAILY  . TOPROL XL 25 MG 24 hr tablet Take 25 mg by mouth daily.   . TRULICITY 1.5 MG/0.5ML SOPN INJECT 1.5 MG INTO THE SKIN ONCE A WEEK  . [DISCONTINUED] levothyroxine (SYNTHROID, LEVOTHROID) 75 MCG tablet TAKE 1 TABLET(75 MCG) BY MOUTH DAILY BEFORE BREAKFAST   No facility-administered encounter medications on file as of 08/19/2016.    ALLERGIES: Allergies  Allergen Reactions  . Penicillins Anaphylaxis    Has patient had a PCN reaction causing immediate rash, facial/tongue/throat swelling, SOB or lightheadedness with hypotension: Yes Has patient had a PCN reaction causing severe rash involving mucus membranes or skin necrosis:  No Has patient had a PCN reaction that required hospitalization No Has patient had a PCN reaction occurring within the last 10 years: No If all of the above answers are "NO", then may proceed with Cephalosporin use.   Marland Kitchen Lotensin [Benazepril Hcl] Cough   VACCINATION STATUS:  There is no immunization history on file for this patient.  Diabetes  She presents for her follow-up diabetic visit. She has type 2 diabetes mellitus. Onset time: She was diagnosed at approximate age of 52 years. Her disease course has been worsening. There are no hypoglycemic associated symptoms. Pertinent negatives for hypoglycemia include no confusion, headaches, pallor or seizures. Associated symptoms include fatigue. Pertinent negatives  for diabetes include no chest pain, no polydipsia, no polyphagia and no polyuria. There are no hypoglycemic complications. Symptoms are worsening. Diabetic complications include nephropathy and peripheral neuropathy. Risk factors for coronary artery disease include diabetes mellitus, dyslipidemia, hypertension and sedentary lifestyle. Current diabetic treatment includes insulin injections and oral agent (monotherapy). She is compliant with treatment most of the time. Her weight is stable. She is following a generally unhealthy diet. She has had a previous visit with a dietitian. She never participates in exercise. (Once again, she did not bring any meter nor logs to review a day.) An ACE inhibitor/angiotensin II receptor blocker is being taken.  Thyroid Problem  Presents for follow-up visit. Symptoms include fatigue. Patient reports no cold intolerance, diarrhea, heat intolerance or palpitations. The symptoms have been improving. Past treatments include levothyroxine. Her past medical history is significant for hyperlipidemia.  Hyperlipidemia  This is a chronic problem. The current episode started more than 1 year ago. Pertinent negatives include no chest pain, myalgias or shortness of breath. Current antihyperlipidemic treatment includes statins.  Hypertension  This is a chronic problem. The current episode started more than 1 year ago. Pertinent negatives include no chest pain, headaches, palpitations or shortness of breath. Past treatments include angiotensin blockers. Identifiable causes of hypertension include a thyroid problem.    Review of Systems  Constitutional: Positive for fatigue. Negative for unexpected weight change.  HENT: Negative for trouble swallowing and voice change.   Eyes: Negative for visual disturbance.  Respiratory: Negative for cough, shortness of breath and wheezing.   Cardiovascular: Negative for chest pain, palpitations and leg swelling.  Gastrointestinal: Negative for  diarrhea, nausea and vomiting.  Endocrine: Negative for cold intolerance, heat intolerance, polydipsia, polyphagia and polyuria.  Musculoskeletal: Negative for arthralgias and myalgias.  Skin: Negative for color change, pallor, rash and wound.  Neurological: Negative for seizures and headaches.  Psychiatric/Behavioral: Negative for confusion and suicidal ideas.    Objective:    BP 119/73   Pulse 87   Ht 5\' 4"  (1.626 m)   Wt 238 lb (108 kg)   BMI 40.85 kg/m   Wt Readings from Last 3 Encounters:  08/19/16 238 lb (108 kg)  03/20/16 245 lb (111.1 kg)  01/13/16 236 lb (107 kg)    Physical Exam  Constitutional: She is oriented to person, place, and time. She appears well-developed.  HENT:  Head: Normocephalic and atraumatic.  Eyes: EOM are normal.  Neck: Normal range of motion. Neck supple. No tracheal deviation present. No thyromegaly present.  Cardiovascular: Normal rate and regular rhythm.   Pulmonary/Chest: Effort normal and breath sounds normal.  Abdominal: Soft. Bowel sounds are normal. There is no tenderness. There is no guarding.  Musculoskeletal: She exhibits no edema.  She walks with her walker due to multiple sclerosis.  Neurological: She is alert  and oriented to person, place, and time. She has normal reflexes. No cranial nerve deficit. Coordination normal.  Skin: Skin is warm and dry. No rash noted. No erythema. No pallor.  Psychiatric: She has a normal mood and affect. Judgment normal.    Results for orders placed or performed in visit on 08/13/16  Comprehensive metabolic panel  Result Value Ref Range   Sodium 138 135 - 146 mmol/L   Potassium 4.9 3.5 - 5.3 mmol/L   Chloride 105 98 - 110 mmol/L   CO2 17 (L) 20 - 31 mmol/L   Glucose, Bld 243 (H) 65 - 99 mg/dL   BUN 24 7 - 25 mg/dL   Creat 1.61 (H) 0.96 - 1.05 mg/dL   Total Bilirubin 0.4 0.2 - 1.2 mg/dL   Alkaline Phosphatase 96 33 - 130 U/L   AST 10 10 - 35 U/L   ALT 15 6 - 29 U/L   Total Protein 6.6 6.1 - 8.1  g/dL   Albumin 3.4 (L) 3.6 - 5.1 g/dL   Calcium 8.9 8.6 - 04.5 mg/dL  Hemoglobin W0J  Result Value Ref Range   Hgb A1c MFr Bld 9.3 (H) <5.7 %   Mean Plasma Glucose 220 mg/dL   Complete Blood Count (Most recent): Lab Results  Component Value Date   WBC 7.0 01/13/2016   HGB 12.1 01/13/2016   HCT 37.3 01/13/2016   MCV 82 01/13/2016   PLT 289 01/13/2016   Chemistry (most recent): Lab Results  Component Value Date   NA 138 08/13/2016   K 4.9 08/13/2016   CL 105 08/13/2016   CO2 17 (L) 08/13/2016   BUN 24 08/13/2016   CREATININE 1.06 (H) 08/13/2016   Diabetic Labs (most recent): Lab Results  Component Value Date   HGBA1C 9.3 (H) 08/13/2016   HGBA1C 7.6 (H) 03/17/2016   HGBA1C 8.2 (H) 12/10/2015   Lipid profile (most recent): Lab Results  Component Value Date   TRIG 117 03/17/2016   CHOL 148 03/17/2016         Assessment & Plan:   1. Type 2 diabetes mellitus with stage 2 chronic kidney disease, with long-term current use of insulin (HCC)  Her diabetes is  complicated by CK D and patient remains at a high risk for more acute and chronic complications of diabetes which include CAD, CVA, CKD, retinopathy, and neuropathy. These are all discussed in detail with the patient.  Once again, patient came without any meter nor logs to review. Her A1c is higher at 9.3% from 7.6%.    She did not bring her meter and logs for visit today. Recent labs reviewed.   - I have re-counseled the patient on diet management and weight loss  by adopting a carbohydrate restricted / protein rich  Diet.  - Suggestion is made for patient to avoid simple carbohydrates   from her diet including Cakes , Desserts, Ice Cream,  Soda (  diet and regular) , Sweet Tea , Candies,  Chips, Cookies, Artificial Sweeteners,   and "Sugar-free" Products .  This will help patient to have stable blood glucose profile and potentially avoid unintended  Weight gain.  - Patient is advised to stick to a routine  mealtimes to eat 3 meals  a day and avoid unnecessary snacks ( to snack only to correct hypoglycemia).  - The patient  Has been  scheduled with Norm Salt, RDN, CDE for individualized DM education.  - I have approached patient with the following individualized plan to  manage diabetes and patient agrees.  - She may need addition of bolus insulin on top of her basal insulin. -However, he is essential to assure her commitment for monitoring for safe use of insulin.  - I have advised her to start strict monitoring of blood glucose 4 times a day-before meals and at bedtime and return in 2 weeks with her meter and logs to review and readjust her therapy.  - In the meantime, I advised her to continue  Lantus 50 units daily at bedtime. -Patient is encouraged to call clinic for blood glucose levels less than 70 or above 300 mg /dl. -For better insulin sensitivity I will continue metformin ER  at lower dose of 500mg  po BID. -Due to CKD patient is not a candidate for SGLT2 inhibitors. -Patient will continue Trulicity 1.5mg  weekly. -Target numbers for A1c, LDL, HDL, Triglycerides, Waist Circumference were discussed in detail.   - Patient specific target  for A1c; LDL, HDL, Triglycerides, and  Waist Circumference were discussed in detail.  2) BP/HTN: Controlled. Continue current medications including ACEI/ARB. 3) Lipids/HPL:  continue statins. 4)  Weight/Diet: CDE consult in progress, exercise, and carbohydrates information provided. 5)  Primary hypothyroidism - Her thyroid function tests are consistent with appropriate replacement . I will refill her levothyroxine 75 g by mouth every morning to replace her existing prescription for 50 g.  - We discussed about correct intake of levothyroxine, at fasting, with water, separated by at least 30 minutes from breakfast, and separated by more than 4 hours from calcium, iron, multivitamins, acid reflux medications (PPIs). -Patient is made aware of the fact  that thyroid hormone replacement is needed for life, dose to be adjusted by periodic monitoring of thyroid function tests.  6) Chronic Care/Health Maintenance:  -Patient is on ACEI/ARB and Statin medications and encouraged to continue to follow up with Ophthalmology, Podiatrist at least yearly or according to recommendations, and advised to  stay away from smoking. I have recommended yearly flu vaccine and pneumonia vaccination at least every 5 years; and  sleep for at least 7 hours a day.  - 20 minutes of time was spent on the care of this patient , 50% of which was applied for counseling on diabetes complications and their preventions.  I advised patient to maintain close follow up with their PCP for primary care needs.  Patient is asked to bring meter and  blood glucose logs during her next visit.   Follow up plan: Return in about 2 weeks (around 09/02/2016) for follow up with meter and logs- no labs.  Marquis Lunch, MD Phone: [phone]  Fax: [phone]   08/19/2016, 11:41 AM

## 2016-08-19 NOTE — Telephone Encounter (Signed)
This pt has been dismissed from the practice and so therefore we can no longer refill any medications moving forward.

## 2016-08-19 NOTE — Patient Instructions (Signed)

## 2016-09-02 ENCOUNTER — Encounter: Payer: Self-pay | Admitting: Gastroenterology

## 2016-09-03 ENCOUNTER — Telehealth: Payer: Self-pay | Admitting: Neurology

## 2016-09-03 ENCOUNTER — Ambulatory Visit (INDEPENDENT_AMBULATORY_CARE_PROVIDER_SITE_OTHER): Payer: Medicare Other | Admitting: "Endocrinology

## 2016-09-03 ENCOUNTER — Encounter: Payer: Self-pay | Admitting: "Endocrinology

## 2016-09-03 VITALS — BP 143/85 | HR 76 | Wt 237.0 lb

## 2016-09-03 DIAGNOSIS — E039 Hypothyroidism, unspecified: Secondary | ICD-10-CM | POA: Diagnosis not present

## 2016-09-03 DIAGNOSIS — Z794 Long term (current) use of insulin: Secondary | ICD-10-CM | POA: Diagnosis not present

## 2016-09-03 DIAGNOSIS — E1122 Type 2 diabetes mellitus with diabetic chronic kidney disease: Secondary | ICD-10-CM

## 2016-09-03 DIAGNOSIS — I10 Essential (primary) hypertension: Secondary | ICD-10-CM | POA: Diagnosis not present

## 2016-09-03 DIAGNOSIS — E782 Mixed hyperlipidemia: Secondary | ICD-10-CM

## 2016-09-03 DIAGNOSIS — N182 Chronic kidney disease, stage 2 (mild): Secondary | ICD-10-CM

## 2016-09-03 NOTE — Patient Instructions (Signed)

## 2016-09-03 NOTE — Progress Notes (Signed)
Subjective:    Patient ID: [name], female    DOB: Jun 04, 1955,  [name], MD   Past Medical History:  Diagnosis Date  . Diabetes mellitus without complication (HCC)   . High cholesterol   . Hypertension   . Hypothyroidism   . Morbid obesity (HCC)   . MS (multiple sclerosis) (HCC) relapsing remitting  . Severe obesity (BMI >= 40) (HCC) 10/11/2012   Past Surgical History:  Procedure Laterality Date  . BIOPSY  03/19/2015   Procedure: BIOPSY;  Surgeon: West Bali, MD;  Location: [facility];  Service: Endoscopy;;  random colon biopsy  . CHOLECYSTECTOMY  8/09  . COLONOSCOPY     about 7 years  . COLONOSCOPY WITH PROPOFOL N/A 03/19/2015   ZOX:[id number] ileum and colon/small internal hemorrhoids  . LEFT OOPHORECTOMY Left 8/11   Social History   Social History  . Marital status: Single    Spouse name: N/A  . Number of children: 0  . Years of education: college   Occupational History  . disabled Not Employed   Social History Main Topics  . Smoking status: Never Smoker  . Smokeless tobacco: Never Used  . Alcohol use No     Comment: quit in 1975  . Drug use: No  . Sexual activity: No   Other Topics Concern  . None   Social History Narrative   Patient is single and lives alone.   Patient is right-handed.   Patient has a college education.   Patient does not drink any caffeine.   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   Outpatient Encounter Prescriptions as of 09/03/2016  Medication Sig  . aspirin 81 MG tablet Take 81 mg by mouth daily.  Marland Kitchen atorvastatin (LIPITOR) 40 MG tablet Take 40 mg by mouth daily.   . baclofen (LIORESAL) 10 MG tablet TAKE 1 TABLET THREE TIMES A DAY  . ergocalciferol (VITAMIN D2) 50000 UNITS capsule Take 50,000 Units by mouth once a week. Takes on Thursdays.  Marland Kitchen imipramine (TOFRANIL) 25 MG tablet Take 1 tablet (25 mg total) by mouth at bedtime.  . interferon beta-1b (BETASERON) 0.3 MG injection Inject 0.3 mg into the skin  every other day.  Marland Kitchen LANTUS SOLOSTAR 100 UNIT/ML SOPN Inject 50 Units into the skin at bedtime.  Marland Kitchen levothyroxine (SYNTHROID, LEVOTHROID) 75 MCG tablet Take 1 tablet (75 mcg total) by mouth daily before breakfast.  . metFORMIN (GLUCOPHAGE-XR) 500 MG 24 hr tablet Take 500 mg by mouth 2 (two) times daily.   Marland Kitchen RELION PEN NEEDLES 32G X 4 MM MISC   . Tamsulosin HCl (FLOMAX) 0.4 MG CAPS Take 0.4 mg by mouth daily after breakfast.   . telmisartan (MICARDIS) 20 MG tablet Take 20 mg by mouth daily.   Marland Kitchen tolterodine (DETROL LA) 4 MG 24 hr capsule TAKE 1 CAPSULE DAILY  . TOPROL XL 25 MG 24 hr tablet Take 25 mg by mouth daily.   . TRULICITY 1.5 MG/0.5ML SOPN INJECT 1.5 MG INTO THE SKIN ONCE A WEEK   No facility-administered encounter medications on file as of 09/03/2016.    ALLERGIES: Allergies  Allergen Reactions  . Penicillins Anaphylaxis    Has patient had a PCN reaction causing immediate rash, facial/tongue/throat swelling, SOB or lightheadedness with hypotension: Yes Has patient had a PCN reaction causing severe rash involving mucus membranes or skin necrosis: No Has patient had a PCN reaction that required hospitalization No Has patient had a PCN reaction occurring  within the last 10 years: No If all of the above answers are "NO", then may proceed with Cephalosporin use.   Marland Kitchen Lotensin [Benazepril Hcl] Cough   VACCINATION STATUS:  There is no immunization history on file for this patient.  Diabetes  She presents for her follow-up diabetic visit. She has type 2 diabetes mellitus. Onset time: She was diagnosed at approximate age of 52 years. Her disease course has been worsening. There are no hypoglycemic associated symptoms. Pertinent negatives for hypoglycemia include no confusion, headaches, pallor or seizures. Associated symptoms include fatigue. Pertinent negatives for diabetes include no chest pain, no polydipsia, no polyphagia and no polyuria. There are no hypoglycemic complications. Symptoms  are worsening. Diabetic complications include nephropathy and peripheral neuropathy. Risk factors for coronary artery disease include diabetes mellitus, dyslipidemia, hypertension and sedentary lifestyle. Current diabetic treatment includes insulin injections and oral agent (monotherapy). She is compliant with treatment most of the time. Her weight is stable. She is following a generally unhealthy diet. She has had a previous visit with a dietitian. She never participates in exercise. (Once again, she did not bring any meter nor logs to review a day.) An ACE inhibitor/angiotensin II receptor blocker is being taken.  Thyroid Problem  Presents for follow-up visit. Symptoms include fatigue. Patient reports no cold intolerance, diarrhea, heat intolerance or palpitations. The symptoms have been improving. Past treatments include levothyroxine. Her past medical history is significant for hyperlipidemia.  Hyperlipidemia  This is a chronic problem. The current episode started more than 1 year ago. Pertinent negatives include no chest pain, myalgias or shortness of breath. Current antihyperlipidemic treatment includes statins.  Hypertension  This is a chronic problem. The current episode started more than 1 year ago. Pertinent negatives include no chest pain, headaches, palpitations or shortness of breath. Past treatments include angiotensin blockers. Identifiable causes of hypertension include a thyroid problem.    Review of Systems  Constitutional: Positive for fatigue. Negative for unexpected weight change.  HENT: Negative for trouble swallowing and voice change.   Eyes: Negative for visual disturbance.  Respiratory: Negative for cough, shortness of breath and wheezing.   Cardiovascular: Negative for chest pain, palpitations and leg swelling.  Gastrointestinal: Negative for diarrhea, nausea and vomiting.  Endocrine: Negative for cold intolerance, heat intolerance, polydipsia, polyphagia and polyuria.   Musculoskeletal: Negative for arthralgias and myalgias.  Skin: Negative for color change, pallor, rash and wound.  Neurological: Negative for seizures and headaches.  Psychiatric/Behavioral: Negative for confusion and suicidal ideas.    Objective:    BP (!) 143/85   Pulse 76   Wt 237 lb (107.5 kg)   SpO2 97%   BMI 40.68 kg/m   Wt Readings from Last 3 Encounters:  09/03/16 237 lb (107.5 kg)  08/19/16 238 lb (108 kg)  03/20/16 245 lb (111.1 kg)    Physical Exam  Constitutional: She is oriented to person, place, and time. She appears well-developed.  HENT:  Head: Normocephalic and atraumatic.  Eyes: EOM are normal.  Neck: Normal range of motion. Neck supple. No tracheal deviation present. No thyromegaly present.  Cardiovascular: Normal rate and regular rhythm.   Pulmonary/Chest: Effort normal and breath sounds normal.  Abdominal: Soft. Bowel sounds are normal. There is no tenderness. There is no guarding.  Musculoskeletal: She exhibits no edema.  She walks with her walker due to multiple sclerosis.  Neurological: She is alert and oriented to person, place, and time. She has normal reflexes. No cranial nerve deficit. Coordination normal.  Skin: Skin  is warm and dry. No rash noted. No erythema. No pallor.  Psychiatric: She has a normal mood and affect. Judgment normal.    Results for orders placed or performed in visit on 08/13/16  Comprehensive metabolic panel  Result Value Ref Range   Sodium 138 135 - 146 mmol/L   Potassium 4.9 3.5 - 5.3 mmol/L   Chloride 105 98 - 110 mmol/L   CO2 17 (L) 20 - 31 mmol/L   Glucose, Bld 243 (H) 65 - 99 mg/dL   BUN 24 7 - 25 mg/dL   Creat 1.61 (H) 0.96 - 1.05 mg/dL   Total Bilirubin 0.4 0.2 - 1.2 mg/dL   Alkaline Phosphatase 96 33 - 130 U/L   AST 10 10 - 35 U/L   ALT 15 6 - 29 U/L   Total Protein 6.6 6.1 - 8.1 g/dL   Albumin 3.4 (L) 3.6 - 5.1 g/dL   Calcium 8.9 8.6 - 04.5 mg/dL  Hemoglobin W0J  Result Value Ref Range   Hgb A1c MFr Bld  9.3 (H) <5.7 %   Mean Plasma Glucose 220 mg/dL   Complete Blood Count (Most recent): Lab Results  Component Value Date   WBC 7.0 01/13/2016   HGB 12.1 01/13/2016   HCT 37.3 01/13/2016   MCV 82 01/13/2016   PLT 289 01/13/2016   Chemistry (most recent): Lab Results  Component Value Date   NA 138 08/13/2016   K 4.9 08/13/2016   CL 105 08/13/2016   CO2 17 (L) 08/13/2016   BUN 24 08/13/2016   CREATININE 1.06 (H) 08/13/2016   Diabetic Labs (most recent): Lab Results  Component Value Date   HGBA1C 9.3 (H) 08/13/2016   HGBA1C 7.6 (H) 03/17/2016   HGBA1C 8.2 (H) 12/10/2015   Lipid profile (most recent): Lab Results  Component Value Date   TRIG 117 03/17/2016   CHOL 148 03/17/2016         Assessment & Plan:   1. Type 2 diabetes mellitus with stage 2 chronic kidney disease, with long-term current use of insulin (HCC)  Her diabetes is  complicated by CK D and patient remains at a high risk for more acute and chronic complications of diabetes which include CAD, CVA, CKD, retinopathy, and neuropathy. These are all discussed in detail with the patient.  Once again, patient came without any meter nor logs to review. Her recent A1c is higher at 9.3% from 7.6%.    She did not bring her meter and logs for visit today. Recent labs reviewed.   - I have re-counseled the patient on diet management and weight loss  by adopting a carbohydrate restricted / protein rich  Diet.  - Suggestion is made for patient to avoid simple carbohydrates   from her diet including Cakes , Desserts, Ice Cream,  Soda (  diet and regular) , Sweet Tea , Candies,  Chips, Cookies, Artificial Sweeteners,   and "Sugar-free" Products .  This will help patient to have stable blood glucose profile and potentially avoid unintended  Weight gain.  - Patient is advised to stick to a routine mealtimes to eat 3 meals  a day and avoid unnecessary snacks ( to snack only to correct hypoglycemia).  - The patient  Has been   scheduled with Norm Salt, RDN, CDE for individualized DM education.  - I have approached patient with the following individualized plan to manage diabetes and patient agrees.  - She may need addition of bolus insulin on top of her basal  insulin. -However, it is is essential to assure her commitment for proper monitoring of blood glucose   for safe use of insulin.  - I have advised her to resume strict monitoring of blood glucose 2 times a day-before breakfast  and at bedtime. - In the meantime, I advised her to continue  Lantus 50 units daily at bedtime. -Patient is encouraged to call clinic for blood glucose levels less than 70 or above 300 mg /dl. -For better insulin sensitivity I will continue metformin ER  at lower dose of 500mg  po BID. -Due to CKD patient is not a candidate for SGLT2 inhibitors. -Patient will continue Trulicity 1.5 mg weekly. -Target numbers for A1c, LDL, HDL, Triglycerides, Waist Circumference were discussed in detail.   - Patient specific target  for A1c; LDL, HDL, Triglycerides, and  Waist Circumference were discussed in detail.  2) BP/HTN: Controlled. Continue current medications including ACEI/ARB. 3) Lipids/HPL:  continue statins. 4)  Weight/Diet: CDE consult in progress, exercise, and carbohydrates information provided. 5)  Primary hypothyroidism - Her thyroid function tests are consistent with appropriate replacement . I  advised her to continue on levothyroxine 75 g by mouth every morning.   - We discussed about correct intake of levothyroxine, at fasting, with water, separated by at least 30 minutes from breakfast, and separated by more than 4 hours from calcium, iron, multivitamins, acid reflux medications (PPIs). -Patient is made aware of the fact that thyroid hormone replacement is needed for life, dose to be adjusted by periodic monitoring of thyroid function tests.  6) Chronic Care/Health Maintenance:  -Patient is on ACEI/ARB and Statin  medications and encouraged to continue to follow up with Ophthalmology, Podiatrist at least yearly or according to recommendations, and advised to  stay away from smoking. I have recommended yearly flu vaccine and pneumonia vaccination at least every 5 years; and  sleep for at least 7 hours a day.  - 20 minutes of time was spent on the care of this patient , 50% of which was applied for counseling on diabetes complications and their preventions.  I advised patient to maintain close follow up with their PCP for primary care needs.  Patient is asked to bring meter and  blood glucose logs during her next visit.   Follow up plan: Return in about 3 months (around 12/04/2016) for follow up with pre-visit labs, meter, and logs.  Marquis Lunch, MD Phone: [phone]  Fax: [phone]   09/03/2016, 12:37 PM

## 2016-09-03 NOTE — Telephone Encounter (Signed)
Pt sister (on [place]) is calling to request reconsideration for pt to be seen at [facility]. Pt sister states she was not aware of the lack of memory that pt has re: appointments and is now taking over.  Pt sister is stressing how very important and beneficial it would be if pt could stay with [facility]. She is stating that she (a PA herself) is taking over the care of pt and will gladly bring pt to all appointments. Pt sister is asking for a call back to advised either way if pt can be seen again.

## 2016-09-05 ENCOUNTER — Other Ambulatory Visit: Payer: Self-pay | Admitting: "Endocrinology

## 2016-09-06 ENCOUNTER — Other Ambulatory Visit: Payer: Self-pay | Admitting: Radiology

## 2016-09-06 NOTE — Telephone Encounter (Signed)
I will speak to Megan/ DR. Penumalli and discuss if this is possible. CD

## 2016-09-07 ENCOUNTER — Telehealth: Payer: Self-pay | Admitting: "Endocrinology

## 2016-09-07 DIAGNOSIS — Z1211 Encounter for screening for malignant neoplasm of colon: Secondary | ICD-10-CM | POA: Diagnosis not present

## 2016-09-07 DIAGNOSIS — I10 Essential (primary) hypertension: Secondary | ICD-10-CM | POA: Diagnosis not present

## 2016-09-07 DIAGNOSIS — G35 Multiple sclerosis: Secondary | ICD-10-CM | POA: Diagnosis not present

## 2016-09-07 DIAGNOSIS — Z79899 Other long term (current) drug therapy: Secondary | ICD-10-CM | POA: Diagnosis not present

## 2016-09-07 DIAGNOSIS — Z299 Encounter for prophylactic measures, unspecified: Secondary | ICD-10-CM | POA: Diagnosis not present

## 2016-09-07 DIAGNOSIS — Z7189 Other specified counseling: Secondary | ICD-10-CM | POA: Diagnosis not present

## 2016-09-07 DIAGNOSIS — E1165 Type 2 diabetes mellitus with hyperglycemia: Secondary | ICD-10-CM | POA: Diagnosis not present

## 2016-09-07 DIAGNOSIS — Z0000 Encounter for general adult medical examination without abnormal findings: Secondary | ICD-10-CM | POA: Diagnosis not present

## 2016-09-07 DIAGNOSIS — E039 Hypothyroidism, unspecified: Secondary | ICD-10-CM | POA: Diagnosis not present

## 2016-09-07 DIAGNOSIS — Z6841 Body Mass Index (BMI) 40.0 and over, adult: Secondary | ICD-10-CM | POA: Diagnosis not present

## 2016-09-07 DIAGNOSIS — E7800 Pure hypercholesterolemia, unspecified: Secondary | ICD-10-CM | POA: Diagnosis not present

## 2016-09-07 DIAGNOSIS — E559 Vitamin D deficiency, unspecified: Secondary | ICD-10-CM | POA: Diagnosis not present

## 2016-09-07 DIAGNOSIS — Z1389 Encounter for screening for other disorder: Secondary | ICD-10-CM | POA: Diagnosis not present

## 2016-09-07 NOTE — Telephone Encounter (Signed)
[name] is calling today stating that [name] blood sugar this morning was 178 with 50 Units of Lantus at bedtime last night

## 2016-09-07 NOTE — Telephone Encounter (Signed)
Noted! Thank you

## 2016-09-08 NOTE — Telephone Encounter (Signed)
Per Dr Dohmeier she is agreeable to taking the patient back and trying once more with the help of the the sister helping her. Will reach out to the patient and patient's sister to have this addressed.

## 2016-09-09 ENCOUNTER — Telehealth: Payer: Self-pay | Admitting: Neurology

## 2016-09-09 ENCOUNTER — Other Ambulatory Visit (HOSPITAL_COMMUNITY): Payer: Self-pay | Admitting: Internal Medicine

## 2016-09-09 DIAGNOSIS — Z1231 Encounter for screening mammogram for malignant neoplasm of breast: Secondary | ICD-10-CM

## 2016-09-09 NOTE — Telephone Encounter (Signed)
Called and spoke with patient's sister Elita Quick, (per DPR),  And informed her that after discussing the situation with dr Dohmeier she agreed to take [name]'s care back over with the understanding that she is definitely going to help with holding the patient accountable to making her appoinments and helping with her care. Pam stated that she truly didn't realize how much the MS had took a toll on her memory. Pam states she is fully involved in her care and to make sure that her number is the first to call because the patient is unable to remember appointments. Pt stated in reminding of appointments please make sure her number is the first contact. I will adjust this in our system. I was able to schedule Ms Diggs for an Apt on Nov 26 th at 10 am. Mrs.[name] verbalized understanding and was very appreciative to [place] taking on her care once more.

## 2016-09-11 ENCOUNTER — Telehealth: Payer: Self-pay | Admitting: "Endocrinology

## 2016-09-11 NOTE — Telephone Encounter (Signed)
Yes she can switch to a.m., however she has to skip the first a.m. if she took it the  night before.

## 2016-09-11 NOTE — Telephone Encounter (Signed)
Pt.notified

## 2016-09-11 NOTE — Telephone Encounter (Signed)
Can pt switch to am instead of pm

## 2016-09-11 NOTE — Telephone Encounter (Signed)
[name] is stating that its been a struggle to give [name] her 50 units of Lantus before bedtime, [name] is getting forgetful and [name] is with her in the mornings only and is asking if the Lantus can be given in the mornings instead of before bedtime, please advise?

## 2016-09-14 ENCOUNTER — Telehealth: Payer: Self-pay | Admitting: "Endocrinology

## 2016-09-14 ENCOUNTER — Other Ambulatory Visit: Payer: Self-pay

## 2016-09-14 MED ORDER — GLUCOSE BLOOD VI STRP: ORAL_STRIP | 5 refills | Status: DC

## 2016-09-14 NOTE — Telephone Encounter (Signed)
Sent to [facility]

## 2016-09-14 NOTE — Telephone Encounter (Signed)
[name] is calling on behalf of [name] stating that a mistake was made on her part and the ONE TOUCH ULTRA TEST test strips should have been called to [facility] instead of [facility], please advise?

## 2016-09-23 ENCOUNTER — Encounter (HOSPITAL_COMMUNITY): Payer: Self-pay

## 2016-09-23 ENCOUNTER — Ambulatory Visit: Payer: Medicare Other | Admitting: Nurse Practitioner

## 2016-09-23 ENCOUNTER — Ambulatory Visit (HOSPITAL_COMMUNITY)
Admission: RE | Admit: 2016-09-23 | Discharge: 2016-09-23 | Disposition: A | Discharge status: Home / Self Care | Payer: Medicare Other | Source: Ambulatory Visit | Attending: Internal Medicine | Admitting: Internal Medicine

## 2016-09-23 DIAGNOSIS — Z1231 Encounter for screening mammogram for malignant neoplasm of breast: Secondary | ICD-10-CM

## 2016-10-06 DIAGNOSIS — E2839 Other primary ovarian failure: Secondary | ICD-10-CM | POA: Diagnosis not present

## 2016-10-19 DIAGNOSIS — E1165 Type 2 diabetes mellitus with hyperglycemia: Secondary | ICD-10-CM | POA: Diagnosis not present

## 2016-10-19 DIAGNOSIS — Z299 Encounter for prophylactic measures, unspecified: Secondary | ICD-10-CM | POA: Diagnosis not present

## 2016-10-19 DIAGNOSIS — Z6841 Body Mass Index (BMI) 40.0 and over, adult: Secondary | ICD-10-CM | POA: Diagnosis not present

## 2016-10-19 DIAGNOSIS — E7800 Pure hypercholesterolemia, unspecified: Secondary | ICD-10-CM | POA: Diagnosis not present

## 2016-10-19 DIAGNOSIS — Z01419 Encounter for gynecological examination (general) (routine) without abnormal findings: Secondary | ICD-10-CM | POA: Diagnosis not present

## 2016-10-19 DIAGNOSIS — G35 Multiple sclerosis: Secondary | ICD-10-CM | POA: Diagnosis not present

## 2016-10-19 DIAGNOSIS — I10 Essential (primary) hypertension: Secondary | ICD-10-CM | POA: Diagnosis not present

## 2016-11-09 ENCOUNTER — Encounter: Payer: Self-pay | Admitting: Neurology

## 2016-11-09 ENCOUNTER — Ambulatory Visit (INDEPENDENT_AMBULATORY_CARE_PROVIDER_SITE_OTHER): Payer: Medicare Other | Admitting: Neurology

## 2016-11-09 VITALS — BP 139/86 | HR 90 | Ht 64.0 in | Wt 242.0 lb

## 2016-11-09 DIAGNOSIS — Z79899 Other long term (current) drug therapy: Secondary | ICD-10-CM | POA: Diagnosis not present

## 2016-11-09 DIAGNOSIS — F0280 Dementia in other diseases classified elsewhere without behavioral disturbance: Secondary | ICD-10-CM | POA: Diagnosis not present

## 2016-11-09 DIAGNOSIS — G35 Multiple sclerosis: Secondary | ICD-10-CM | POA: Diagnosis not present

## 2016-11-09 MED ORDER — DONEPEZIL HCL 5 MG PO TABS: 5.0000 mg | ORAL_TABLET | Freq: Every day | ORAL | 1 refills | Status: DC

## 2016-11-09 MED ORDER — SODIUM CHLORIDE 0.9 % IV SOLN: 600.0000 mg | Freq: Once | INTRAVENOUS | 2 refills | Status: AC

## 2016-11-09 NOTE — Patient Instructions (Signed)
Ocrelizumab injection What is this medicine? OCRELIZUMAB (ok re LIZ ue mab) treats multiple sclerosis. It helps to decrease the number of multiple sclerosis relapses. It is not a cure. This medicine may be used for other purposes; ask your health care provider or pharmacist if you have questions. COMMON BRAND NAME(S): OCREVUS What should I tell my health care provider before I take this medicine? They need to know if you have any of these conditions: -cancer -hepatitis B infection -other infection (especially a virus infection such as chickenpox, cold sores, or herpes) -an unusual or allergic reaction to ocrelizumab, other medicines, foods, dyes or preservatives -pregnant or trying to get pregnant -breast-feeding How should I use this medicine? This medicine is for infusion into a vein. It is given by a health care professional in a hospital or clinic setting. Talk to your pediatrician regarding the use of this medicine in children. Special care may be needed. Overdosage: If you think you have taken too much of this medicine contact a poison control center or emergency room at once. NOTE: This medicine is only for you. Do not share this medicine with others. What if I miss a dose? Keep appointments for follow-up doses as directed. It is important not to miss your dose. Call your doctor or health care professional if you are unable to keep an appointment. What may interact with this medicine? -alemtuzumab -daclizumab -dimethyl fumarate -fingolimod -glatiramer -interferon beta -live virus vaccines -mitoxantrone -natalizumab -peginterferon beta -rituximab -steroid medicines like prednisone or cortisone -teriflunomide This list may not describe all possible interactions. Give your health care provider a list of all the medicines, herbs, non-prescription drugs, or dietary supplements you use. Also tell them if you smoke, drink alcohol, or use illegal drugs. Some items may interact with  your medicine. What should I watch for while using this medicine? Tell your doctor or healthcare professional if your symptoms do not start to get better or if they get worse. This medicine can cause serious allergic reactions. To reduce your risk you may need to take medicine before treatment with this medicine. Take your medicine as directed. Women should inform their doctor if they wish to become pregnant or think they might be pregnant. There is a potential for serious side effects to an unborn child. Talk to your health care professional or pharmacist for more information. Female patients should use effective birth control methods while receiving this medicine and for 6 months after the last dose. Call your doctor or health care professional for advice if you get a fever, chills or sore throat, or other symptoms of a cold or flu. Do not treat yourself. This drug decreases your body's ability to fight infections. Try to avoid being around people who are sick. If you have a hepatitis B infection or a history of a hepatitis B infection, talk to your doctor. The symptoms of hepatitis B may get worse if you take this medicine. In some patients, this medicine may cause a serious brain infection that may cause death. If you have any problems seeing, thinking, speaking, walking, or standing, tell your doctor right away. If you cannot reach your doctor, urgently seek other source of medical care. This medicine can decrease the response to a vaccine. If you need to get vaccinated, tell your healthcare professional if you have received this medicine. Extra booster doses may be needed. Talk to your doctor to see if a different vaccination schedule is needed. Talk to your doctor about your risk of cancer.   You may be more at risk for certain types of cancers if you take this medicine. What side effects may I notice from receiving this medicine? Side effects that you should report to your doctor or health care  professional as soon as possible: -allergic reactions like skin rash, itching or hives, swelling of the face, lips, or tongue -breathing problems -facial flushing -fast, irregular heartbeat -lump or soreness in the breast -signs and symptoms of herpes such as cold sore, shingles, or genital sores -signs and symptoms of infection like fever or chills, cough, sore throat, pain or trouble passing urine -signs and symptoms of low blood pressure like dizziness; feeling faint or lightheaded, falls; unusually weak or tired -signs and symptoms of progressive multifocal leukoencephalopathy (PML) like changes in vision; clumsiness; confusion; personality changes; weakness on one side of the body -swelling of the ankles, feet, hands Side effects that usually do not require medical attention (report these to your doctor or health care professional if they continue or are bothersome): -back pain -depressed mood -diarrhea -pain, redness, or irritation at site where injected This list may not describe all possible side effects. Call your doctor for medical advice about side effects. You may report side effects to FDA at 1-800-FDA-1088. Where should I keep my medicine? This drug is given in a hospital or clinic and will not be stored at home. NOTE: This sheet is a summary. It may not cover all possible information. If you have questions about this medicine, talk to your doctor, pharmacist, or health care provider.  2018 Elsevier/Gold Standard (2015-04-30 09:40:25)  

## 2016-11-09 NOTE — Progress Notes (Signed)
[facility]  Provider:  Melvyn Novas, M D  Referring Provider: [name], MD Primary Care Physician:  [name], MD  Chief Complaint  Patient presents with  . Follow-up    pt here with sister to get reestablished MS care    HPI: 11-09-2016   [name] is a 61 y.o. female  Is seen here as a revisit  from Dr. Sherril Croon for multiple sclerosis follow up.  I had the pleasure of seeing Mrs. Albano today in the presence of her sister, physician assistant [name]. I know Rinaldo Cloud from the [facility] radiology department where she has worked with the interventional team. I last saw Mrs. Jeremiah in late 2017 when our goal was to change her to a medication of infusion character, namely OCREVUS. At the time she had an MRI with out contrast that confirmed that and as was her diagnosis, but could not 100% differentiate between acute and more remote lesions. Now, almost a year later when it again as Mrs. Ivie had missed some of her appointments. From now on she will bring her sister with her. My goal is still to change the patient to a therapy that doesn't need to daily or every other day compliance neither pill no injection but rather an infusion therapy here in office. Low compliance would therefore not be as impairing. I would still like to change her to OCREVUS- but will have to repeat the hepatitis panel, the gold tuberculosis test and the patient information form.  She is not driving any longer but lives alone- close to her sister. CD   The patient had followed [name] for over 20 years , diagnosed since 1989 ,, and is now mainly seen by [name], NP. She has never been on another medication, but Betaseron.  In my last visit , 2013 , I suggested Ampyra to her , which we than initiated but had to D/C after her kidney function changed.   The patient is diabetic, diagnosed in 2008. She just changed in 2014 to insulin.  She remains on Betaseron. Has been gaining  weight and has had a fall last December. Uses a Rollator when she is outdoors but indoors a walking stick or cane, but more and more frequently the walker. .   She believes her sleep is poor due to spasms and nocturia, she may snore. Still wakes with a dry mouth, has been told she snores. Sleeps prone. The patient underwent a sleep study I believe in 2014, but was unable to tolerate the CPAP due to her sleeping habit. She has a chief complaint today of cramping pain in the left leg, spasm, at the groin and right above the hip. No radiation , no burning and no deep ache - just spams. Onset 3 weeks ago.  Although, she has taken baclofen po now tid  for a long time , for the " MS hug" - it had an effect only on abdominal spasms. On the same dose, it has not affected the hip spasms.  She believes her sleep is poor due to spasms and nocturia, she may snore. Wakes with a dry mouth, has been told she snores. Sleeps prone.  The spasms in the left hip are preceded by sitting in certain low chairs in [employer] , her volunteer work place.  Dr. Sherril Croon  recently did some routine Labs, that are not available on EPIC.    Interval history from 09/17/2015, Mrs. Robicheaux is seen here  today after her May 2017 first visit with me. She had an excellent comprehensive metabolic panel result no abnormalities of liver or kidney function. She tends to have higher potassium levels but she was also not diluting properly, meaning the patient was probably dehydrated to some degree at the time of the blood draw. I also checked her for JC virus and it was -0.21 JC antibody is considered negative or indeterminate. Theoretically, this patient could be using an oral medication for the treatment of MS and many of these are stronger than the injectable interferons. However her MRI from recent has shown no acute lesions and her history doesn't really speak of recent relapses. 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 I will add the necessary labs today which are also including a hepatitis panel and a gold blood test for tuberculosis. If these are negative I will really prefer her to have an infusion therapy. 09-17-2015  35 minute re-Assessment with ore than 50% of the time dedicated to face to face discussion and coordination of care. :   Originally MS relapsing - remitting , but now  without relapse over 10 years . May enter the  progressive phase of the disease.  She will remain on Betaseron, not oral medication. 20 years .  She was not doing well on AMPYRA, her renal function declined..  Fatigue due to MS or sleepiness from nocturia and apnea?  Sleep study revealed mild AHI, tried CPAP but couldn't tolerate the sleep position. She sleeps usually prone/  Baclofen now 10 mg tid. Spasm are most bother some at night. She has refills.   PT re - evaluation , fall prevention to be re- discussed.   She uses a walker  .   Her urologist at [facility] - she sees Dr. Patsi Sears.  CMET was normal, JCV indeterminate, MRI without acute brain lesions.   I have the pleasure of seeing Mrs. Seigler today on 01/13/2016, who has not had any clinical symptoms of relapse since last seen in August 2017. She reports memory deficits however. Dr. Epimenio Foot had read her MRI and advised me in regards to possible starting OCREVUS. Her viral panel, hepatitis panel were all in normal limits. She would be a candidate. The question is if her disease will progress further or if percreta was could stop it. She may have ended secondary progressive MS at this point. She is currently on Betaseron , for about 20 years.  She will continue BETASERON, until OCREVUS will be approved. She is very interested.      Review of Systems: Out of a complete 14 system review, the patient complains of only the following symptoms, and all other reviewed systems are negative.  Hip spasm,  pain , non radiating , weight gain, fatigue,  Gait disorder, falls , urinary incontinence- urge - neurogenic bladder dysfunction. , nocturia.    Social History   Social History  . Marital status: Single    Spouse name: N/A  . Number of children: 0  . Years of education: college   Occupational History  . disabled Not Employed   Social History Main Topics  . Smoking status: Never Smoker  . Smokeless tobacco: Never Used  . Alcohol use No     Comment: quit in 1975  . Drug use: No  . Sexual activity: No   Other Topics Concern  . Not on file  Social History Narrative   Patient is single and lives alone.   Patient is right-handed.   Patient has a college education.   Patient does not drink any caffeine.   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    Family History  Problem Relation Age of Onset  . Heart attack Mother   . Diabetes Mother   . Aneurysm Father   . Heart disease Father   . Cancer Maternal Grandmother        breast  . Colon cancer Neg Hx     Past Medical History:  Diagnosis Date  . Diabetes mellitus without complication (HCC)   . High cholesterol   . Hypertension   . Hypothyroidism   . Morbid obesity (HCC)   . MS (multiple sclerosis) (HCC) relapsing remitting  . Severe obesity (BMI >= 40) (HCC) 10/11/2012    Past Surgical History:  Procedure Laterality Date  . BIOPSY  03/19/2015   Procedure: BIOPSY;  Surgeon: West Bali, MD;  Location: [facility];  Service: Endoscopy;;  random colon biopsy  . CHOLECYSTECTOMY  8/09  . COLONOSCOPY     about 7 years  . COLONOSCOPY WITH PROPOFOL N/A 03/19/2015   BJY:NWGNFA ileum and colon/small internal hemorrhoids  . LEFT OOPHORECTOMY Left 8/11    Current Outpatient Prescriptions  Medication Sig Dispense Refill  . aspirin 81 MG tablet Take 81 mg by mouth daily.    Marland Kitchen atorvastatin (LIPITOR) 40 MG tablet Take 40 mg by mouth daily.     . baclofen (LIORESAL) 10 MG tablet TAKE 1 TABLET THREE TIMES A DAY  (Patient taking differently: 1 in am and 2 in PM) 270 tablet 1  . Calcium Carbonate (CALCIUM 600 PO) Take 1 tablet by mouth daily.    . Ergocalciferol 2000 units CAPS Take 50,000 Units by mouth daily. Takes on Thursdays.    Marland Kitchen glucose blood (ONE TOUCH ULTRA TEST) test strip Use as instructed 4 x daily. E11.65 150 each 5  . imipramine (TOFRANIL) 25 MG tablet Take 1 tablet (25 mg total) by mouth at bedtime. 90 tablet 3  . LANTUS SOLOSTAR 100 UNIT/ML SOPN Inject 50 Units into the skin at bedtime.    Marland Kitchen levothyroxine (SYNTHROID, LEVOTHROID) 75 MCG tablet Take 1 tablet (75 mcg total) by mouth daily before breakfast. (Patient taking differently: Take 25 mcg by mouth daily before breakfast. ) 30 tablet 6  . metFORMIN (GLUCOPHAGE-XR) 500 MG 24 hr tablet Take 500 mg by mouth 2 (two) times daily.     Marland Kitchen RELION PEN NEEDLES 32G X 4 MM MISC     . Tamsulosin HCl (FLOMAX) 0.4 MG CAPS Take 0.4 mg by mouth daily after breakfast.     . tolterodine (DETROL LA) 4 MG 24 hr capsule TAKE 1 CAPSULE DAILY 90 capsule 3  . TOPROL XL 25 MG 24 hr tablet Take 25 mg by mouth daily.     . TRULICITY 1.5 MG/0.5ML SOPN INJECT 1.5 MG INTO THE SKIN ONCE A WEEK 2 mL 2   No current facility-administered medications for this visit.     Allergies as of 11/09/2016 - Review Complete 11/09/2016  Allergen Reaction Noted  . Penicillins Anaphylaxis 10/07/2011  . Lotensin [benazepril hcl] Cough 03/11/2015   patient lost 50 pounds since 2016 .  Vitals: BP 139/86   Pulse 90   Ht  (1.626 m)   Wt 242 lb (109.8 kg)   BMI 41.54 kg/m  Last Weight:  Wt Readings from Last  1 Encounters:  11/09/16 242 lb (109.8 kg)   Last Height:   Ht Readings from Last 1 Encounters:  11/09/16  (1.626 m)    Physical exam:  General: The patient is awake, alert and appears not in acute distress. The patient is well groomed. She is right handed. Head: Normocephalic, atraumatic.  Neck is supple. Mallampati 3 , neck circumference: 15 inches , no  retrognathia, no retainers, no TMJ, no nasal deviation  Cardiovascular:  Regular rate and rhythm, without  murmurs or carotid bruit, and without distended neck veins. Respiratory: Lungs are clear to auscultation.Skin:  Without evidence of edema, or rash. Trunk: BMI remains elevated.  Neurologic exam :The patient is awake and alert, oriented to place and time.  Memory subjective described as impaired- slower , some word finding . Her speech is mildly dysarthric.  There is a limited attention span & concentration ability. Speech is fluent without  dysarthria, dysphonia- Mood and affect are appropriate.   Montreal Cognitive Assessment  01/13/2016  Visuospatial/ Executive (0/5) 5  Naming (0/3) 2  Attention: Read list of digits (0/2) 2  Attention: Read list of letters (0/1) 1  Attention: Serial 7 subtraction starting at 100 (0/3) 2  Language: Repeat phrase (0/2) 2  Language : Fluency (0/1) 1  Abstraction (0/2) 2  Delayed Recall (0/5) 1  Orientation (0/6) 5  Total 23   MOCA   Cranial nerves:  Smell and taste is preserved. Pupils are equal in size, and reacting sluggishlyto light. No reaction to accomodation.  Funduscopic exam without evidence of pallor or edema. No history of optic neuritis.  Reports horizontal diplopia.  Extraocular movements  in vertical and horizontal planes intact and without nystagmus. Visual fields by finger perimetry are intact. Hearing to finger rub intact.  Facial sensation intact to fine touch. Facial motor strength is symmetric and tongue and uvula move midline. Motor exam: Normal tone and normal muscle bulk and symmetric normal strength in all extremities. Sensory:  Fine touch, pinprick and vibration were tested in all extremities. Proprioception in upper extremities is normal. Coordination: Finger-to-nose maneuver without evidence of ataxia, but with dysmetria (no tremor).  Reports right leg coordination difficulties, but not handwriting changes.  The patient  has trouble lifting the right foot and adducting/ flexing the right hip. Spasticity increased.  She places the  left leg first on the stool before she tries to step up to the exam table, She is unable to turn, unable to rise without bracing herself,  Patient walks with assistive device, a walker with built in seat  She reports not using the seat much.  Deep tendon reflexes: in the upper and lower extremities are very brisk .   Right handed , with brisker reflexes on the right. Babinski maneuver response is up going right, and down going left .  I discussed with Marylu Lund and Pam the difference in IV therapy with monoclonal antibodies versus other treatment options , in light pf Katurah's memory decline and obvious compliance problems.   She will read up on OCREVUS and is interested in starting in the new year.  She is interested in a less frequent dosing of medication. HEPATITIS, TB test and JCV test are  to be repeated . Memory to be retested next visit in 4-6 weeks.     [facility] [address] [place], [place] [address] [phone]   PLAN : RV in 4-6 weeks with NP or me / starting OCREVUS.   Porfirio Mylar Areana Kosanke,  MD 01-13-2016

## 2016-11-10 ENCOUNTER — Telehealth: Payer: Self-pay | Admitting: Neurology

## 2016-11-10 LAB — COMPREHENSIVE METABOLIC PANEL
A/G RATIO: 1.1 — AB (ref 1.2–2.2)
ALT: 15 IU/L (ref 0–32)
AST: 17 IU/L (ref 0–40)
Albumin: 3.8 g/dL (ref 3.5–5.5)
Alkaline Phosphatase: 115 IU/L (ref 39–117)
BILIRUBIN TOTAL: 0.2 mg/dL (ref 0.0–1.2)
BUN/Creatinine Ratio: 21 (ref 9–23)
BUN: 22 mg/dL (ref 6–24)
CALCIUM: 9.1 mg/dL (ref 8.7–10.2)
CHLORIDE: 102 mmol/L (ref 96–106)
CO2: 21 mmol/L (ref 20–29)
Creatinine, Ser: 1.05 mg/dL — ABNORMAL HIGH (ref 0.57–1.00)
GFR calc Af Amer: 68 mL/min/{1.73_m2} (ref >59)
GFR calc non Af Amer: 59 mL/min/{1.73_m2} — ABNORMAL LOW (ref >59)
GLUCOSE: 201 mg/dL — AB (ref 65–99)
Globulin, Total: 3.4 g/dL (ref 1.5–4.5)
POTASSIUM: 4.2 mmol/L (ref 3.5–5.2)
Sodium: 141 mmol/L (ref 134–144)
Total Protein: 7.2 g/dL (ref 6.0–8.5)

## 2016-11-10 LAB — CBC WITH DIFFERENTIAL/PLATELET
BASOS ABS: 0 10*3/uL (ref 0.0–0.2)
Basos: 1 %
EOS (ABSOLUTE): 0.2 10*3/uL (ref 0.0–0.4)
Eos: 3 %
Hematocrit: 35.8 % (ref 34.0–46.6)
Hemoglobin: 11.8 g/dL (ref 11.1–15.9)
IMMATURE GRANS (ABS): 0 10*3/uL (ref 0.0–0.1)
IMMATURE GRANULOCYTES: 0 %
LYMPHS: 19 %
Lymphocytes Absolute: 1.3 10*3/uL (ref 0.7–3.1)
MCH: 26.8 pg (ref 26.6–33.0)
MCHC: 33 g/dL (ref 31.5–35.7)
MCV: 81 fL (ref 79–97)
Monocytes Absolute: 0.4 10*3/uL (ref 0.1–0.9)
Monocytes: 6 %
NEUTROS PCT: 71 %
Neutrophils Absolute: 4.6 10*3/uL (ref 1.4–7.0)
PLATELETS: 367 10*3/uL (ref 150–379)
RBC: 4.4 x10E6/uL (ref 3.77–5.28)
RDW: 15 % (ref 12.3–15.4)
WBC: 6.6 10*3/uL (ref 3.4–10.8)

## 2016-11-10 LAB — HEPATITIS B SURFACE ANTIGEN: HEP B S AG: NEGATIVE

## 2016-11-10 LAB — HEPATITIS B SURFACE ANTIBODY,QUALITATIVE: HEP B SURFACE AB, QUAL: NONREACTIVE

## 2016-11-10 LAB — HEPATITIS B CORE ANTIBODY, TOTAL: HEP B C TOTAL AB: NEGATIVE

## 2016-11-10 NOTE — Telephone Encounter (Signed)
Left a message for the patient explaining some of the labs. I will call pam and discuss as well.

## 2016-11-10 NOTE — Telephone Encounter (Signed)
-----   Message from Melvyn Novas, MD sent at 11/10/2016 10:37 AM EDT ----- Elevated glucose( this test was drawn while non fasting), very slight reduction in kidney clearance. We could change the MRI to a contrast study if patient and sister Elita Quick agrees.  Safe for OCREVUS  Cc Dr Sherril Croon in [place]

## 2016-11-11 ENCOUNTER — Telehealth: Payer: Self-pay | Admitting: Neurology

## 2016-11-11 ENCOUNTER — Other Ambulatory Visit: Payer: Self-pay | Admitting: Neurology

## 2016-11-11 ENCOUNTER — Ambulatory Visit (INDEPENDENT_AMBULATORY_CARE_PROVIDER_SITE_OTHER): Payer: Medicare Other | Admitting: Urology

## 2016-11-11 DIAGNOSIS — R339 Retention of urine, unspecified: Secondary | ICD-10-CM | POA: Diagnosis not present

## 2016-11-11 DIAGNOSIS — N3020 Other chronic cystitis without hematuria: Secondary | ICD-10-CM

## 2016-11-11 DIAGNOSIS — G35 Multiple sclerosis: Secondary | ICD-10-CM

## 2016-11-11 NOTE — Telephone Encounter (Signed)
We need one- with and without contrast now- had to wait for CMET, given that she has DM and possible CKD. CD

## 2016-11-11 NOTE — Telephone Encounter (Signed)
-----   Message from Carmen Dohmeier, MD sent at 11/10/2016 10:37 AM EDT ----- Elevated glucose( this test was drawn while non fasting), very slight reduction in kidney clearance. We could change the MRI to a contrast study if patient and sister Pam agrees.  Safe for OCREVUS  Cc Dr Vyas in [place] 

## 2016-11-11 NOTE — Telephone Encounter (Signed)
Called the patient's sister [name] and went over all the details with her on the lab work and that we could get her started with Ocrevus. I have informed her that a form will need to be filled out and signed by her in order to get the process started. Pt sister says that she will try her best to come and pick that up between now and the apt. If not she will sign at the next office visit. Pt sister is agreeable to the MRI to be completed with contrast I will make this know to Dr. Vickey Huger so that if she would like we can place order for the MRI. Pt's sister verbalized understanding and had no further questions.

## 2016-11-12 DIAGNOSIS — I10 Essential (primary) hypertension: Secondary | ICD-10-CM | POA: Diagnosis not present

## 2016-11-12 DIAGNOSIS — E7800 Pure hypercholesterolemia, unspecified: Secondary | ICD-10-CM | POA: Diagnosis not present

## 2016-11-12 DIAGNOSIS — E119 Type 2 diabetes mellitus without complications: Secondary | ICD-10-CM | POA: Diagnosis not present

## 2016-11-19 ENCOUNTER — Other Ambulatory Visit (HOSPITAL_COMMUNITY)
Admission: RE | Admit: 2016-11-19 | Discharge: 2016-11-19 | Disposition: A | Discharge status: Home / Self Care | Payer: Medicare Other | Source: Ambulatory Visit | Attending: Urology | Admitting: Urology

## 2016-11-19 DIAGNOSIS — R339 Retention of urine, unspecified: Secondary | ICD-10-CM | POA: Diagnosis not present

## 2016-11-19 LAB — QUANTIFERON-TB GOLD PLUS
QUANTIFERON MITOGEN VALUE: 8.37 [IU]/mL
QUANTIFERON NIL VALUE: 0.03 [IU]/mL
QUANTIFERON TB1 AG VALUE: 0.02 [IU]/mL
QUANTIFERON TB2 AG VALUE: 0.02 [IU]/mL
QUANTIFERON-TB GOLD PLUS: NEGATIVE

## 2016-11-21 LAB — URINE CULTURE: Culture: >=100000 — AB

## 2016-11-23 ENCOUNTER — Telehealth: Payer: Self-pay | Admitting: *Deleted

## 2016-11-23 ENCOUNTER — Ambulatory Visit
Admission: RE | Admit: 2016-11-23 | Discharge: 2016-11-23 | Disposition: A | Discharge status: Home / Self Care | Payer: Medicare Other | Source: Ambulatory Visit | Attending: Neurology | Admitting: Neurology

## 2016-11-23 DIAGNOSIS — G35 Multiple sclerosis: Secondary | ICD-10-CM

## 2016-11-23 MED ORDER — GADOBENATE DIMEGLUMINE 529 MG/ML IV SOLN
20.0000 mL | Freq: Once | INTRAVENOUS | Status: AC | PRN
  Administered 2016-11-23: 20 mL via INTRAVENOUS

## 2016-11-23 NOTE — Telephone Encounter (Signed)
-----   Message from Melvyn Novas, MD sent at 11/20/2016 11:48 AM EDT ----- All OCREVUS pretest requirements have been good. The patient can now start with Therapy. RV with me in 3 month.

## 2016-11-24 ENCOUNTER — Telehealth: Payer: Self-pay | Admitting: *Deleted

## 2016-11-24 NOTE — Telephone Encounter (Signed)
Called and spoke with [name] (on DPR). Advised MRI showed typical MS pattern of demylination, almost unchanged since 2016 per Dr Vickey Huger. She verbalized understanding.   [name] stated forms signed yesterday for ocrevus.

## 2016-11-24 NOTE — Telephone Encounter (Signed)
-----   Message from Melvyn Novas, MD sent at 11/24/2016 10:35 AM EDT ----- Typical MS-pattern of demyelination , almost unchanged since 2016. Evaluation for change to Ocrevus , CC Dr Sherril Croon

## 2016-11-30 ENCOUNTER — Telehealth: Payer: Self-pay | Admitting: "Endocrinology

## 2016-11-30 MED ORDER — INSULIN GLARGINE 100 UNIT/ML SOLOSTAR PEN: 50.0000 [IU] | PEN_INJECTOR | Freq: Every day | SUBCUTANEOUS | 0 refills | Status: DC

## 2016-11-30 NOTE — Telephone Encounter (Signed)
Pt is in the doughnut hole and cannot afford the Trulicity. We dont get consistent samples of this drug.

## 2016-11-30 NOTE — Telephone Encounter (Signed)
[name] is requesting a refill on LANTUS SOLOSTAR 100 UNIT/ML SOPN please advise?

## 2016-11-30 NOTE — Telephone Encounter (Signed)
She can stay off of it, will discuss her options on her return visit. Stay on insulin and meformin.

## 2016-12-01 DIAGNOSIS — E1122 Type 2 diabetes mellitus with diabetic chronic kidney disease: Secondary | ICD-10-CM | POA: Diagnosis not present

## 2016-12-01 DIAGNOSIS — Z794 Long term (current) use of insulin: Secondary | ICD-10-CM | POA: Diagnosis not present

## 2016-12-01 DIAGNOSIS — E039 Hypothyroidism, unspecified: Secondary | ICD-10-CM | POA: Diagnosis not present

## 2016-12-01 DIAGNOSIS — N182 Chronic kidney disease, stage 2 (mild): Secondary | ICD-10-CM | POA: Diagnosis not present

## 2016-12-02 LAB — T4, FREE: FREE T4: 1.2 ng/dL (ref 0.8–1.8)

## 2016-12-02 LAB — RENAL FUNCTION PANEL
ALBUMIN MSPROF: 3.7 g/dL (ref 3.6–5.1)
BUN: 25 mg/dL (ref 7–25)
CO2: 28 mmol/L (ref 20–32)
CREATININE: 1.04 mg/dL (ref 0.50–1.05)
Calcium: 9.3 mg/dL (ref 8.6–10.4)
Chloride: 104 mmol/L (ref 98–110)
Glucose, Bld: 206 mg/dL — ABNORMAL HIGH (ref 65–139)
PHOSPHORUS: 3.6 mg/dL (ref 2.5–4.5)
Potassium: 4.4 mmol/L (ref 3.5–5.3)
SODIUM: 142 mmol/L (ref 135–146)

## 2016-12-02 LAB — TSH: TSH: 2.54 m[IU]/L (ref 0.40–4.50)

## 2016-12-02 LAB — HEMOGLOBIN A1C
EAG (MMOL/L): 11.1 (calc)
Hgb A1c MFr Bld: 8.6 % of total Hgb — ABNORMAL HIGH (ref <5.7)
MEAN PLASMA GLUCOSE: 200 (calc)

## 2016-12-07 ENCOUNTER — Other Ambulatory Visit: Payer: Self-pay | Admitting: Neurology

## 2016-12-07 MED ORDER — BACLOFEN 10 MG PO TABS: 10.0000 mg | ORAL_TABLET | Freq: Three times a day (TID) | ORAL | 3 refills | Status: DC

## 2016-12-08 ENCOUNTER — Ambulatory Visit (INDEPENDENT_AMBULATORY_CARE_PROVIDER_SITE_OTHER): Payer: Medicare Other | Admitting: "Endocrinology

## 2016-12-08 ENCOUNTER — Encounter: Payer: Self-pay | Admitting: "Endocrinology

## 2016-12-08 VITALS — BP 140/82 | HR 78 | Ht 64.0 in | Wt 244.0 lb

## 2016-12-08 DIAGNOSIS — E1122 Type 2 diabetes mellitus with diabetic chronic kidney disease: Secondary | ICD-10-CM

## 2016-12-08 DIAGNOSIS — I10 Essential (primary) hypertension: Secondary | ICD-10-CM | POA: Diagnosis not present

## 2016-12-08 DIAGNOSIS — N182 Chronic kidney disease, stage 2 (mild): Secondary | ICD-10-CM | POA: Diagnosis not present

## 2016-12-08 DIAGNOSIS — Z794 Long term (current) use of insulin: Secondary | ICD-10-CM | POA: Diagnosis not present

## 2016-12-08 DIAGNOSIS — E039 Hypothyroidism, unspecified: Secondary | ICD-10-CM

## 2016-12-08 DIAGNOSIS — E782 Mixed hyperlipidemia: Secondary | ICD-10-CM | POA: Diagnosis not present

## 2016-12-08 NOTE — Progress Notes (Signed)
Subjective:    Patient ID: [name], female    DOB: November 21, 1955,  [name], MD   Past Medical History:  Diagnosis Date  . Diabetes mellitus without complication (HCC)   . High cholesterol   . Hypertension   . Hypothyroidism   . Morbid obesity (HCC)   . MS (multiple sclerosis) (HCC) relapsing remitting  . Severe obesity (BMI >= 40) (HCC) 10/11/2012   Past Surgical History:  Procedure Laterality Date  . CHOLECYSTECTOMY  8/09  . COLONOSCOPY     about 7 years  . LEFT OOPHORECTOMY Left 8/11   Social History   Socioeconomic History  . Marital status: Single    Spouse name: None  . Number of children: 0  . Years of education: college  . Highest education level: None  Social Needs  . Financial resource strain: None  . Food insecurity - worry: None  . Food insecurity - inability: None  . Transportation needs - medical: None  . Transportation needs - non-medical: None  Occupational History  . Occupation: disabled    Associate Professor: NOT EMPLOYED  Tobacco Use  . Smoking status: Never Smoker  . Smokeless tobacco: Never Used  Substance and Sexual Activity  . Alcohol use: No    Alcohol/week: 0.0 oz    Comment: quit in 1975  . Drug use: No  . Sexual activity: No    Birth control/protection: Post-menopausal  Other Topics Concern  . None  Social History Narrative   Patient is single and lives alone.   Patient is right-handed.   Patient has a college education.   Patient does not drink any caffeine.   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   Outpatient Encounter Medications as of 12/08/2016  Medication Sig  . aspirin 81 MG tablet Take 81 mg by mouth daily.  Marland Kitchen atorvastatin (LIPITOR) 40 MG tablet Take 40 mg by mouth daily.   . baclofen (LIORESAL) 10 MG tablet Take 1 tablet (10 mg total) 3 (three) times daily by mouth.  . Calcium Carbonate (CALCIUM 600 PO) Take 1 tablet by mouth daily.  Marland Kitchen donepezil (ARICEPT) 5 MG tablet Take 1 tablet (5 mg total) by  mouth at bedtime.  . Ergocalciferol 2000 units CAPS Take 50,000 Units by mouth daily. Takes on Thursdays.  Marland Kitchen glucose blood (ONE TOUCH ULTRA TEST) test strip Use as instructed 4 x daily. E11.65  . imipramine (TOFRANIL) 25 MG tablet Take 1 tablet (25 mg total) by mouth at bedtime.  . Insulin Glargine (LANTUS SOLOSTAR) 100 UNIT/ML Solostar Pen Inject 50 Units at bedtime into the skin. (Patient taking differently: Inject 60 Units at bedtime into the skin. )  . levothyroxine (SYNTHROID, LEVOTHROID) 75 MCG tablet Take 1 tablet (75 mcg total) by mouth daily before breakfast. (Patient taking differently: Take 25 mcg by mouth daily before breakfast. )  . metFORMIN (GLUCOPHAGE-XR) 500 MG 24 hr tablet Take 500 mg by mouth 2 (two) times daily.   Marland Kitchen RELION PEN NEEDLES 32G X 4 MM MISC   . Tamsulosin HCl (FLOMAX) 0.4 MG CAPS Take 0.4 mg by mouth daily after breakfast.   . tolterodine (DETROL LA) 4 MG 24 hr capsule TAKE 1 CAPSULE DAILY  . TOPROL XL 25 MG 24 hr tablet Take 25 mg by mouth daily.   . [DISCONTINUED] Insulin Glargine (LANTUS) 100 UNIT/ML Solostar Pen Inject 50 Units at bedtime into the skin.  . [DISCONTINUED] TRULICITY 1.5 MG/0.5ML SOPN INJECT 1.5 MG INTO THE SKIN  ONCE A WEEK   No facility-administered encounter medications on file as of 12/08/2016.    ALLERGIES: Allergies  Allergen Reactions  . Penicillins Anaphylaxis    Has patient had a PCN reaction causing immediate rash, facial/tongue/throat swelling, SOB or lightheadedness with hypotension: Yes Has patient had a PCN reaction causing severe rash involving mucus membranes or skin necrosis: No Has patient had a PCN reaction that required hospitalization No Has patient had a PCN reaction occurring within the last 10 years: No If all of the above answers are "NO", then may proceed with Cephalosporin use.   Marland Kitchen. Lotensin [Benazepril Hcl] Cough   VACCINATION STATUS:  There is no immunization history on file for this patient.  Diabetes  She  presents for her follow-up diabetic visit. She has type 2 diabetes mellitus. Onset time: She was diagnosed at approximate age of 52 years. Her disease course has been improving. There are no hypoglycemic associated symptoms. Pertinent negatives for hypoglycemia include no confusion, headaches, pallor or seizures. Associated symptoms include fatigue. Pertinent negatives for diabetes include no chest pain, no polydipsia, no polyphagia and no polyuria. There are no hypoglycemic complications. Symptoms are improving. Diabetic complications include nephropathy and peripheral neuropathy. Risk factors for coronary artery disease include diabetes mellitus, dyslipidemia, hypertension and sedentary lifestyle. Current diabetic treatment includes insulin injections and oral agent (monotherapy). She is compliant with treatment most of the time. Her weight is increasing steadily. She is following a generally unhealthy diet. She has had a previous visit with a dietitian. She never participates in exercise. Her home blood glucose trend is decreasing steadily. Her breakfast blood glucose range is generally 180-200 mg/dl. Her bedtime blood glucose range is generally >200 mg/dl. Her overall blood glucose range is 180-200 mg/dl. An ACE inhibitor/angiotensin II receptor blocker is being taken.  Thyroid Problem  Presents for follow-up visit. Symptoms include fatigue. Patient reports no cold intolerance, diarrhea, heat intolerance or palpitations. The symptoms have been improving. Past treatments include levothyroxine. Her past medical history is significant for hyperlipidemia.  Hyperlipidemia  This is a chronic problem. The current episode started more than 1 year ago. Pertinent negatives include no chest pain, myalgias or shortness of breath. Current antihyperlipidemic treatment includes statins.  Hypertension  This is a chronic problem. The current episode started more than 1 year ago. Pertinent negatives include no chest pain,  headaches, palpitations or shortness of breath. Past treatments include angiotensin blockers. Identifiable causes of hypertension include a thyroid problem.    Review of Systems  Constitutional: Positive for fatigue. Negative for unexpected weight change.  HENT: Negative for trouble swallowing and voice change.   Eyes: Negative for visual disturbance.  Respiratory: Negative for cough, shortness of breath and wheezing.   Cardiovascular: Negative for chest pain, palpitations and leg swelling.  Gastrointestinal: Negative for diarrhea, nausea and vomiting.  Endocrine: Negative for cold intolerance, heat intolerance, polydipsia, polyphagia and polyuria.  Musculoskeletal: Negative for arthralgias and myalgias.  Skin: Negative for color change, pallor, rash and wound.  Neurological: Negative for seizures and headaches.  Psychiatric/Behavioral: Negative for confusion and suicidal ideas.    Objective:    BP 140/82   Pulse 78   Ht 5\' 4"  (1.626 m)   Wt 244 lb (110.7 kg)   BMI 41.88 kg/m   Wt Readings from Last 3 Encounters:  12/08/16 244 lb (110.7 kg)  11/09/16 242 lb (109.8 kg)  09/03/16 237 lb (107.5 kg)    Physical Exam  Constitutional: She is oriented to person, place, and time.  She appears well-developed.  HENT:  Head: Normocephalic and atraumatic.  Eyes: EOM are normal.  Neck: Normal range of motion. Neck supple. No tracheal deviation present. No thyromegaly present.  Cardiovascular: Normal rate and regular rhythm.  Pulmonary/Chest: Effort normal and breath sounds normal.  Abdominal: Soft. Bowel sounds are normal. There is no tenderness. There is no guarding.  Musculoskeletal: She exhibits no edema.  She walks with her walker due to multiple sclerosis.  Neurological: She is alert and oriented to person, place, and time. She has normal reflexes. No cranial nerve deficit. Coordination normal.  Skin: Skin is warm and dry. No rash noted. No erythema. No pallor.  Psychiatric: She has  a normal mood and affect. Judgment normal.    Results for orders placed or performed during the hospital encounter of 11/19/16  Urine Culture  Result Value Ref Range   Specimen Description URINE, CLEAN CATCH    Special Requests NONE    Culture >=100,000 COLONIES/mL ESCHERICHIA COLI (A)    Report Status 11/21/2016 FINAL    Organism ID, Bacteria ESCHERICHIA COLI (A)       Susceptibility   Escherichia coli - MIC*    AMPICILLIN <=2 SENSITIVE Sensitive     CEFAZOLIN <=4 SENSITIVE Sensitive     CEFTRIAXONE <=1 SENSITIVE Sensitive     CIPROFLOXACIN <=0.25 SENSITIVE Sensitive     GENTAMICIN <=1 SENSITIVE Sensitive     IMIPENEM <=0.25 SENSITIVE Sensitive     NITROFURANTOIN <=16 SENSITIVE Sensitive     TRIMETH/SULFA >=320 RESISTANT Resistant     AMPICILLIN/SULBACTAM <=2 SENSITIVE Sensitive     PIP/TAZO <=4 SENSITIVE Sensitive     Extended ESBL NEGATIVE Sensitive     * >=100,000 COLONIES/mL ESCHERICHIA COLI   Complete Blood Count (Most recent): Lab Results  Component Value Date   WBC 6.6 11/09/2016   HGB 11.8 11/09/2016   HCT 35.8 11/09/2016   MCV 81 11/09/2016   PLT 367 11/09/2016   Chemistry (most recent): Lab Results  Component Value Date   NA 142 12/01/2016   K 4.4 12/01/2016   CL 104 12/01/2016   CO2 28 12/01/2016   BUN 25 12/01/2016   CREATININE 1.04 12/01/2016   Diabetic Labs (most recent): Lab Results  Component Value Date   HGBA1C 8.6 (H) 12/01/2016   HGBA1C 9.3 (H) 08/13/2016   HGBA1C 7.6 (H) 03/17/2016   Lipid Panel     Component Value Date/Time   CHOL 148 03/17/2016 1228   TRIG 117 03/17/2016 1228   HDL 55 03/17/2016 1228   CHOLHDL 2.7 03/17/2016 1228   VLDL 23 03/17/2016 1228   LDLCALC 70 03/17/2016 1228    Assessment & Plan:   1. Type 2 diabetes mellitus with stage 2 chronic kidney disease, with long-term current use of insulin (HCC)  Her diabetes is  complicated by CK D and patient remains at a high risk for more acute and chronic complications  of diabetes which include CAD, CVA, CKD, retinopathy, and neuropathy. These are all discussed in detail with the patient. - She is her last visit, she is getting a big help from her sister , Elita Quick,  who is also a Advice worker who works in [employer]. - She makes sure that Ramata gets her medications,  A1c improved to 8.6% from 9.3%. No hypoglycemia reported or documented. - I have re-counseled the patient on diet management and weight loss  by adopting a carbohydrate restricted / protein rich  Diet.  -  Suggestion is made for her  to avoid simple carbohydrates  from her diet including Cakes, Sweet Desserts / Pastries, Ice Cream, Soda (diet and regular), Sweet Tea, Candies, Chips, Cookies, Store Bought Juices, Alcohol in Excess of  1-2 drinks a day, Artificial Sweeteners, and "Sugar-free" Products. This will help patient to have stable blood glucose profile and potentially avoid unintended weight gain.   - Patient is advised to stick to a routine mealtimes to eat 3 meals  a day and avoid unnecessary snacks ( to snack only to correct hypoglycemia).  - I have approached patient with the following individualized plan to manage diabetes and patient agrees.  - She continues to improve due to the fact that she is getting her medications on time as ordered. This was possibly due to help from her sister.  - I have advised her to continue Lantus 60 units daily in the morning with breakfast, associated with strict monitoring of blood glucose 2 times a day-before breakfast  and at bedtime. -Patient is encouraged to call clinic for blood glucose levels less than 70 or above 300 mg /dl. -For better insulin sensitivity I will continue metformin ER  at lower dose of 500mg  po twice a day.  -Due to CKD patient is not a candidate for SGLT2 inhibitors. - she is in insurance For coverage of Trulicity , gave her samples for 0.75 mg weekly for 2 weeks.  -Target numbers for A1c, LDL, HDL, Triglycerides, Waist  Circumference were discussed in detail.   - Patient specific target  for A1c; LDL, HDL, Triglycerides, and  Waist Circumference were discussed in detail.  2) BP/HTN: Controlled. Continue current medications including ACEI/ARB. 3) Lipids/HPL: Controlled with LDL 70. continue statins. 4)  Weight/Diet: CDE consult in progress, exercise, and carbohydrates information provided.  5)  Primary hypothyroidism - Her thyroid function tests are consistent with appropriate replacement . I  advised her to continue on levothyroxine 75 g by mouth every morning.   - We discussed about correct intake of levothyroxine, at fasting, with water, separated by at least 30 minutes from breakfast, and separated by more than 4 hours from calcium, iron, multivitamins, acid reflux medications (PPIs). -Patient is made aware of the fact that thyroid hormone replacement is needed for life, dose to be adjusted by periodic monitoring of thyroid function tests.  6) Chronic Care/Health Maintenance:  -Patient is on ACEI/ARB and Statin medications and encouraged to continue to follow up with Ophthalmology, Podiatrist at least yearly or according to recommendations, and advised to  stay away from smoking. I have recommended yearly flu vaccine and pneumonia vaccination at least every 5 years; and  sleep for at least 7 hours a day.  I advised patient to maintain close follow up with her PCP for primary care needs.  - Time spent with the patient: 25 min, of which >50% was spent in reviewing her sugar logs , discussing her hypo- and hyper-glycemic episodes, reviewing her current and  previous labs and insulin doses and developing a plan to avoid hypo- and hyper-glycemia.   Follow up plan: Return in about 3 months (around 03/10/2017) for follow up with pre-visit labs, meter, and logs.  Marquis Lunch, MD Phone: [phone]  Fax: [phone]   12/08/2016, 4:18 PM

## 2016-12-08 NOTE — Progress Notes (Signed)
Sister Beckey Downing reviewed pts medication list at visit and confirmed it is correct.

## 2016-12-09 ENCOUNTER — Ambulatory Visit: Payer: Medicare Other | Admitting: "Endocrinology

## 2016-12-21 ENCOUNTER — Ambulatory Visit: Payer: Self-pay | Admitting: Neurology

## 2016-12-23 ENCOUNTER — Encounter: Payer: Self-pay | Admitting: Neurology

## 2016-12-23 ENCOUNTER — Ambulatory Visit (INDEPENDENT_AMBULATORY_CARE_PROVIDER_SITE_OTHER): Payer: Medicare Other | Admitting: Neurology

## 2016-12-23 VITALS — BP 135/90 | HR 87 | Ht 64.0 in | Wt 242.0 lb

## 2016-12-23 DIAGNOSIS — G35 Multiple sclerosis: Secondary | ICD-10-CM

## 2016-12-23 DIAGNOSIS — G3184 Mild cognitive impairment, so stated: Secondary | ICD-10-CM | POA: Diagnosis not present

## 2016-12-23 NOTE — Progress Notes (Signed)
[facility]  Provider:  Melvyn Hanson, M D  Referring Provider: [name], MD Primary Care Physician:  Margaret Specking, MD  Chief Complaint  Patient presents with  . Follow-up    pt here with sister, rm 54. pt is set to start Ocrevus in dec.  here to discuss MRI and lab work      Interval history from 23 December 2016, I am seeing [name] here today long-established patient with MS and recent cognitive decline, in the presence of her sister [name], [place].  In our last visit 6 weeks ago we had discussed to start endocrine fluids for the treatment of MS which helps her to be compliant. The first infusion is scheduled for December 27 th.  In preparation we had obtained a blood test for tuberculosis was returned negative,  negative hepatitis panel, comprehensive metabolic panel and CBC.  Her creatinine is a slight bit elevated, but her glomerular filtration rate was 59 -which is still considered normal.   She also had high blood sugars.  White and red blood cell counts were normal region.There was no anemia.  The patient's MRI from 23 November 2016 showed multiple T2 and FLAIR hyperintense foci in the pattern and configuration consistent with demyelinating plaques.  This is a chronic multiple sclerosis as none of the foci appear to be acute.  I consider the patient a good candidate for OCREVUS. I was pleasantly surprised to see the patient looking brighter, more alert and certainly overall healthy.today .  She has eaten more regularly and more fresh food, she also has more social interaction leaves the house more often.  Appears more alert.  She is able to walk with her  4 wheeled walker. Mrs [name] felt that Aricept has had a positive effect on her sister.  We will repeat a MOCA/ MMSE in 3 month , next visit with NP.      HPI: 11-09-2016 [name] is a 60 y.o. female  Is seen here as a revisit  from Dr. Sherril Hanson for multiple sclerosis follow up.  I had the  pleasure of seeing [name] today in the presence of her sister, physician assistant [name]. I know [name] from the [facility] radiology department where she has worked with the interventional team. I last saw [name] in late 2017 when our goal was to change her to a medication of infusion character, namely OCREVUS. At the time she had an MRI with out contrast that confirmed that and as was her diagnosis, but could not 100% differentiate between acute and more remote lesions. Now, almost a year later when it again as [name] had missed some of her appointments. From now on she will bring her sister with her. My goal is still to change the patient to a therapy that doesn't need to daily or every other day compliance neither pill no injection but rather an infusion therapy here in office. Low compliance would therefore not be as impairing. I would still like to change her to OCREVUS- but will have to repeat the hepatitis panel, the gold tuberculosis test and the patient information form.  She is not driving any longer but lives alone- close to her sister. CD   The patient had followed Dr. Orlin Hanson for over 20 years , diagnosed since 1989 ,, and is now mainly seen by Margaret Angel, NP. She has never been on another medication, but Betaseron.  In my last visit , 2013 ,  I suggested Ampyra to her , which we than initiated but had to D/C after her kidney function changed.   The patient is diabetic, diagnosed in 2008. She just changed in 2014 to insulin.  She remains on Betaseron. Has been gaining weight and has had a fall last December. Uses a Rollator when she is outdoors but indoors a walking stick or cane, but more and more frequently the walker. .   She believes her sleep is poor due to spasms and nocturia, she may snore. Still wakes with a dry mouth, has been told she snores. Sleeps prone. The patient underwent a sleep study I believe in 2014, but was unable to tolerate the CPAP due to her  sleeping habit. She has a chief complaint today of cramping pain in the left leg, spasm, at the groin and right above the hip. No radiation , no burning and no deep ache - just spams. Onset 3 weeks ago.  Although, she has taken baclofen po now tid  for a long time , for the " MS hug" - it had an effect only on abdominal spasms. On the same dose, it has not affected the hip spasms.  She believes her sleep is poor due to spasms and nocturia, she may snore. Wakes with a dry mouth, has been told she snores. Sleeps prone.  The spasms in the left hip are preceded by sitting in certain low chairs in [employer] , her volunteer work place.  Dr. Sherril Hanson  recently did some routine Labs, that are not available on EPIC.    Interval history from 09/17/2015, [name] is seen here today after her May 2017 first visit with me. She had an excellent comprehensive metabolic panel result no abnormalities of liver or kidney function. She tends to have higher potassium levels but she was also not diluting properly, meaning the patient was probably dehydrated to some degree at the time of the blood draw. I also checked her for JC virus and it was -0.21 JC antibody is considered negative or indeterminate. Theoretically, this patient could be using an oral medication for the treatment of MS and many of these are stronger than the injectable interferons. However her MRI from recent has shown no acute lesions and her history doesn't really speak of recent relapses. 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 I will add the necessary labs today which are also including a hepatitis panel and a gold blood test for tuberculosis. If these are negative I will really prefer her to have an infusion therapy. 09-17-2015  35 minute re-Assessment with ore than 50% of the time dedicated to face to face discussion and coordination of care. :    Originally MS relapsing - remitting , but now  without relapse over 10 years . May enter the  progressive phase of the disease.  She will remain on Betaseron, not oral medication. 20 years .  She was not doing well on AMPYRA, her renal function declined..  Fatigue due to MS or sleepiness from nocturia and apnea?  Sleep study revealed mild AHI, tried CPAP but couldn't tolerate the sleep position. She sleeps usually prone/  Baclofen now 10 mg tid. Spasm are most bother some at night. She has refills.   PT re - evaluation , fall prevention to be re- discussed.   She uses a walker  .   Her urologist at  [facility] - she sees Dr. Patsi Searsannenbaum.  CMET was normal, JCV indeterminate, MRI without acute brain lesions.   I have the pleasure of seeing [name] today on 01/13/2016, who has not had any clinical symptoms of relapse since last seen in August 2017. She reports memory deficits however. Dr. Epimenio FootSater had read her MRI and advised me in regards to possible starting OCREVUS. Her viral panel, hepatitis panel were all in normal limits. She would be a candidate. The question is if her disease will progress further or if percreta was could stop it. She may have ended secondary progressive MS at this point. She is currently on Betaseron , for about 20 years.  She will continue BETASERON, until OCREVUS will be approved. She is very interested.      Review of Systems: Out of a complete 14 system review, the patient complains of only the following symptoms, and all other reviewed systems are negative.  Hip spasm, pain , non radiating , weight gain, fatigue,  Gait disorder, falls , urinary incontinence- urge - neurogenic bladder dysfunction. , nocturia.    Social History   Socioeconomic History  . Marital status: Single    Spouse name: Not on file  . Number of children: 0  . Years of education: college  . Highest education level: Not on file  Social Needs  . Financial resource strain: Not on file  . Food  insecurity - worry: Not on file  . Food insecurity - inability: Not on file  . Transportation needs - medical: Not on file  . Transportation needs - non-medical: Not on file  Occupational History  . 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 Smoking status: Never Smoker  . Smokeless tobacco: Never Used  Substance and Sexual Activity  . Alcohol use: No    Alcohol/week: 0.0 oz    Comment: quit in 1975  . Drug use: No  . Sexual activity: No    Birth control/protection: Post-menopausal  Other Topics Concern  . Not on file  Social History Narrative   Patient is single and lives alone.   Patient is right-handed.   Patient has a college education.   Patient does not drink any caffeine.   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    Family History  Problem Relation Age of Onset  . Heart attack Mother   . Diabetes Mother   . Aneurysm Father   . Heart disease Father   . Cancer Maternal Grandmother        breast  . Colon cancer Neg Hx     Past Medical History:  Diagnosis Date  . Diabetes mellitus without complication (HCC)   . High cholesterol   . Hypertension   . Hypothyroidism   . Morbid obesity (HCC)   . MS (multiple sclerosis) (HCC) relapsing remitting  . Severe obesity (BMI >= 40) (HCC) 10/11/2012    Past Surgical History:  Procedure Laterality Date  . BIOPSY  03/19/2015   Procedure: BIOPSY;  Surgeon: West BaliSandi L Fields, MD;  Location: [facility];  Service: Endoscopy;;  random colon biopsy  . CHOLECYSTECTOMY  8/09  . COLONOSCOPY     about 7 years  . COLONOSCOPY WITH PROPOFOL N/A 03/19/2015   ZOX:[id number]:normal ileum and colon/small internal hemorrhoids  . LEFT OOPHORECTOMY Left 8/11    Current Outpatient Medications  Medication Sig Dispense Refill  . aspirin 81 MG tablet Take 81 mg by mouth daily.    .Marland Kitchen  atorvastatin (LIPITOR) 40 MG tablet Take 40 mg by mouth daily.     . baclofen (LIORESAL) 10 MG tablet Take 1 tablet (10 mg total) 3 (three)  times daily by mouth. (Patient taking differently: Take 10 mg by mouth 3 (three) times daily. Take 10 mg in am and 20mg  in pm) 270 tablet 3  . Calcium Carbonate (CALCIUM 600 PO) Take 1 tablet by mouth daily.    Marland Kitchen donepezil (ARICEPT) 5 MG tablet Take 1 tablet (5 mg total) by mouth at bedtime. 30 tablet 1  . Ergocalciferol 2000 units CAPS Take 2,000 capsules by mouth daily.     Marland Kitchen glucose blood (ONE TOUCH ULTRA TEST) test strip Use as instructed 4 x daily. E11.65 150 each 5  . imipramine (TOFRANIL) 25 MG tablet Take 1 tablet (25 mg total) by mouth at bedtime. 90 tablet 3  . Insulin Glargine (LANTUS SOLOSTAR) 100 UNIT/ML Solostar Pen Inject 50 Units at bedtime into the skin. (Patient taking differently: Inject 60 Units at bedtime into the skin. ) 45 mL 0  . levothyroxine (SYNTHROID, LEVOTHROID) 25 MCG tablet Take 75 mcg daily before breakfast by mouth.    . metFORMIN (GLUCOPHAGE-XR) 500 MG 24 hr tablet Take 500 mg by mouth 2 (two) times daily.     . methenamine (HIPREX) 1 g tablet 2 (two) times daily with a meal.     . RELION PEN NEEDLES 32G X 4 MM MISC     . Tamsulosin HCl (FLOMAX) 0.4 MG CAPS Take 0.4 mg by mouth daily after breakfast.     . tolterodine (DETROL LA) 4 MG 24 hr capsule TAKE 1 CAPSULE DAILY 90 capsule 3  . TOPROL XL 25 MG 24 hr tablet Take 25 mg by mouth daily.      No current facility-administered medications for this visit.     Allergies as of 12/23/2016 - Review Complete 12/23/2016  Allergen Reaction Noted  . Penicillins Anaphylaxis 10/07/2011  . Lotensin [benazepril hcl] Cough 03/11/2015   patient lost 50 pounds since 2016 .  Vitals: BP 135/90   Pulse 87   Ht 5\' 4"  (1.626 m)   Wt 242 lb (109.8 kg)   BMI 41.54 kg/m  Last Weight:  Wt Readings from Last 1 Encounters:  12/23/16 242 lb (109.8 kg)   Last Height:   Ht Readings from Last 1 Encounters:  12/23/16 5\' 4"  (1.626 m)    Physical exam: General: The patient is awake, alert and appears not in acute distress.  The patient is well groomed. She is right handed.  Head: Normocephalic, atraumatic.  Neck is supple. Mallampati 3 , neck circumference: 15 inches , no retrognathia, no retainers, no TMJ, no nasal deviation  Cardiovascular:  Regular rate and rhythm, without murmurs or carotid bruit, and without distended neck veins. Respiratory: Lungs are clear to auscultation.Skin:  Without evidence of edema, or rash. Trunk: BMI remains elevated. Neurologic exam :The patient is awake and alert, oriented to place and time.  Memory subjective described as impaired- slower , some word finding . Her speech is mildly dysarthric. There is a limited attention span & concentration ability.Speech is fluent without  dysarthria, dysphonia- Mood and affect are appropriate, she is more alter.   Montreal Cognitive Assessment  01/13/2016  Visuospatial/ Executive (0/5) 5  Naming (0/3) 2  Attention: Read list of digits (0/2) 2  Attention: Read list of letters (0/1) 1  Attention: Serial 7 subtraction starting at 100 (0/3) 2  Language: Repeat phrase (0/2) 2  Language : Fluency (0/1) 1  Abstraction (0/2) 2  Delayed Recall (0/5) 1  Orientation (0/6) 5  Total 23   MOCA   Cranial nerves:  Smell and taste is preserved. Pupils are equal in size, and reacting sluggishlyto light. No reaction to accomodation.  Funduscopic exam without evidence of pallor or edema. No history of optic neuritis.  Reports horizontal diplopia.  Extraocular movements  in vertical and horizontal planes intact and without nystagmus. Visual fields by finger perimetry are intact. Hearing to finger rub intact.  Facial sensation intact to fine touch. Facial motor strength is symmetric and tongue and uvula move midline. Motor exam: Normal tone and normal muscle bulk and symmetric normal strength in all extremities. Sensory:  Fine touch, pinprick and vibration were tested in all extremities. Proprioception in upper extremities is normal. Coordination:  Finger-to-nose maneuver without evidence of ataxia, but with dysmetria (no tremor).  Reports right leg coordination difficulties, but not handwriting changes.  The patient has trouble lifting the right foot and adducting/ flexing the right hip. Spasticity increased.  She places the  left leg first on the stool before she tries to step up to the exam table, She is unable to turn, unable to rise without bracing herself,  Patient walks with assistive device, a walker with built in seat  She reports not using the seat much.  Deep tendon reflexes: in the upper and lower extremities are very brisk .   Right handed, with brisker reflexes on the right. Babinski maneuver response is up going right, and down going left .  I discussed with [name] and [name] the difference in IV therapy with monoclonal antibodies versus other treatment options , in light of [name]'s memory decline and obvious compliance problems.   She will start  OCREVUS 01-21-2017  Memory to be retested next visit in 12 weeks with NP .   Margaret Novas, MD  [facility] [address], [address] [place], [place] [address] [phone]     Margaret Novas, MD 01-13-2016

## 2016-12-23 NOTE — Patient Instructions (Signed)
Ocrelizumab injection What is this medicine? OCRELIZUMAB (ok re LIZ ue mab) treats multiple sclerosis. It helps to decrease the number of multiple sclerosis relapses. It is not a cure. This medicine may be used for other purposes; ask your health care provider or pharmacist if you have questions. COMMON BRAND NAME(S): OCREVUS What should I tell my health care provider before I take this medicine? They need to know if you have any of these conditions: -cancer -hepatitis B infection -other infection (especially a virus infection such as chickenpox, cold sores, or herpes) -an unusual or allergic reaction to ocrelizumab, other medicines, foods, dyes or preservatives -pregnant or trying to get pregnant -breast-feeding How should I use this medicine? This medicine is for infusion into a vein. It is given by a health care professional in a hospital or clinic setting. Talk to your pediatrician regarding the use of this medicine in children. Special care may be needed. Overdosage: If you think you have taken too much of this medicine contact a poison control center or emergency room at once. NOTE: This medicine is only for you. Do not share this medicine with others. What if I miss a dose? Keep appointments for follow-up doses as directed. It is important not to miss your dose. Call your doctor or health care professional if you are unable to keep an appointment. What may interact with this medicine? -alemtuzumab -daclizumab -dimethyl fumarate -fingolimod -glatiramer -interferon beta -live virus vaccines -mitoxantrone -natalizumab -peginterferon beta -rituximab -steroid medicines like prednisone or cortisone -teriflunomide This list may not describe all possible interactions. Give your health care provider a list of all the medicines, herbs, non-prescription drugs, or dietary supplements you use. Also tell them if you smoke, drink alcohol, or use illegal drugs. Some items may interact with  your medicine. What should I watch for while using this medicine? Tell your doctor or healthcare professional if your symptoms do not start to get better or if they get worse. This medicine can cause serious allergic reactions. To reduce your risk you may need to take medicine before treatment with this medicine. Take your medicine as directed. Women should inform their doctor if they wish to become pregnant or think they might be pregnant. There is a potential for serious side effects to an unborn child. Talk to your health care professional or pharmacist for more information. Female patients should use effective birth control methods while receiving this medicine and for 6 months after the last dose. Call your doctor or health care professional for advice if you get a fever, chills or sore throat, or other symptoms of a cold or flu. Do not treat yourself. This drug decreases your body's ability to fight infections. Try to avoid being around people who are sick. If you have a hepatitis B infection or a history of a hepatitis B infection, talk to your doctor. The symptoms of hepatitis B may get worse if you take this medicine. In some patients, this medicine may cause a serious brain infection that may cause death. If you have any problems seeing, thinking, speaking, walking, or standing, tell your doctor right away. If you cannot reach your doctor, urgently seek other source of medical care. This medicine can decrease the response to a vaccine. If you need to get vaccinated, tell your healthcare professional if you have received this medicine. Extra booster doses may be needed. Talk to your doctor to see if a different vaccination schedule is needed. Talk to your doctor about your risk of cancer.   You may be more at risk for certain types of cancers if you take this medicine. What side effects may I notice from receiving this medicine? Side effects that you should report to your doctor or health care  professional as soon as possible: -allergic reactions like skin rash, itching or hives, swelling of the face, lips, or tongue -breathing problems -facial flushing -fast, irregular heartbeat -lump or soreness in the breast -signs and symptoms of herpes such as cold sore, shingles, or genital sores -signs and symptoms of infection like fever or chills, cough, sore throat, pain or trouble passing urine -signs and symptoms of low blood pressure like dizziness; feeling faint or lightheaded, falls; unusually weak or tired -signs and symptoms of progressive multifocal leukoencephalopathy (PML) like changes in vision; clumsiness; confusion; personality changes; weakness on one side of the body -swelling of the ankles, feet, hands Side effects that usually do not require medical attention (report these to your doctor or health care professional if they continue or are bothersome): -back pain -depressed mood -diarrhea -pain, redness, or irritation at site where injected This list may not describe all possible side effects. Call your doctor for medical advice about side effects. You may report side effects to FDA at 1-800-FDA-1088. Where should I keep my medicine? This drug is given in a hospital or clinic and will not be stored at home. NOTE: This sheet is a summary. It may not cover all possible information. If you have questions about this medicine, talk to your doctor, pharmacist, or health care provider.  2018 Elsevier/Gold Standard (2015-04-30 09:40:25)  

## 2016-12-24 ENCOUNTER — Telehealth: Payer: Self-pay | Admitting: "Endocrinology

## 2016-12-24 DIAGNOSIS — Z299 Encounter for prophylactic measures, unspecified: Secondary | ICD-10-CM | POA: Diagnosis not present

## 2016-12-24 DIAGNOSIS — I10 Essential (primary) hypertension: Secondary | ICD-10-CM | POA: Diagnosis not present

## 2016-12-24 DIAGNOSIS — Z6841 Body Mass Index (BMI) 40.0 and over, adult: Secondary | ICD-10-CM | POA: Diagnosis not present

## 2016-12-24 DIAGNOSIS — Z23 Encounter for immunization: Secondary | ICD-10-CM | POA: Diagnosis not present

## 2016-12-24 DIAGNOSIS — E7800 Pure hypercholesterolemia, unspecified: Secondary | ICD-10-CM | POA: Diagnosis not present

## 2016-12-24 DIAGNOSIS — E039 Hypothyroidism, unspecified: Secondary | ICD-10-CM | POA: Diagnosis not present

## 2016-12-24 DIAGNOSIS — G35 Multiple sclerosis: Secondary | ICD-10-CM | POA: Diagnosis not present

## 2016-12-24 DIAGNOSIS — E1165 Type 2 diabetes mellitus with hyperglycemia: Secondary | ICD-10-CM | POA: Diagnosis not present

## 2016-12-24 NOTE — Telephone Encounter (Signed)
Pt notified that we are out of Trulicity samples at this time

## 2016-12-24 NOTE — Telephone Encounter (Signed)
Pam is asking if we have any samples of Trulicity for Lyrick, please advise?

## 2016-12-31 DIAGNOSIS — I10 Essential (primary) hypertension: Secondary | ICD-10-CM | POA: Diagnosis not present

## 2016-12-31 DIAGNOSIS — E7800 Pure hypercholesterolemia, unspecified: Secondary | ICD-10-CM | POA: Diagnosis not present

## 2016-12-31 DIAGNOSIS — E119 Type 2 diabetes mellitus without complications: Secondary | ICD-10-CM | POA: Diagnosis not present

## 2017-01-05 ENCOUNTER — Other Ambulatory Visit: Payer: Self-pay | Admitting: Neurology

## 2017-01-05 DIAGNOSIS — G35 Multiple sclerosis: Secondary | ICD-10-CM

## 2017-01-05 DIAGNOSIS — Z79899 Other long term (current) drug therapy: Secondary | ICD-10-CM

## 2017-01-05 MED ORDER — DONEPEZIL HCL 5 MG PO TABS: 5.0000 mg | ORAL_TABLET | Freq: Every day | ORAL | 10 refills | Status: DC

## 2017-01-21 DIAGNOSIS — G35 Multiple sclerosis: Secondary | ICD-10-CM | POA: Diagnosis not present

## 2017-01-27 ENCOUNTER — Ambulatory Visit (INDEPENDENT_AMBULATORY_CARE_PROVIDER_SITE_OTHER): Payer: Medicare Other | Admitting: Urology

## 2017-01-27 DIAGNOSIS — N3020 Other chronic cystitis without hematuria: Secondary | ICD-10-CM | POA: Diagnosis not present

## 2017-01-27 DIAGNOSIS — R339 Retention of urine, unspecified: Secondary | ICD-10-CM

## 2017-01-28 ENCOUNTER — Telehealth: Payer: Self-pay | Admitting: Neurology

## 2017-01-28 NOTE — Telephone Encounter (Signed)
Called and spoke to them and they stated they were calling about the one sent in in november. They had not received the fax that was already sent in earlier today that did have her initials

## 2017-01-28 NOTE — Telephone Encounter (Signed)
Megan/[facility] [phone] have rec'd ocrevus start form from 10/18. But she said they need Dr Dohmeier initials next to her signature and the date. Please fax back to [phone]

## 2017-02-01 ENCOUNTER — Other Ambulatory Visit: Payer: Self-pay

## 2017-02-01 MED ORDER — DULAGLUTIDE 0.75 MG/0.5ML ~~LOC~~ SOAJ: 0.5000 mL | SUBCUTANEOUS | 0 refills | Status: DC

## 2017-02-02 DIAGNOSIS — G35 Multiple sclerosis: Secondary | ICD-10-CM | POA: Diagnosis not present

## 2017-02-09 DIAGNOSIS — I10 Essential (primary) hypertension: Secondary | ICD-10-CM | POA: Diagnosis not present

## 2017-02-09 DIAGNOSIS — E7800 Pure hypercholesterolemia, unspecified: Secondary | ICD-10-CM | POA: Diagnosis not present

## 2017-02-09 DIAGNOSIS — E119 Type 2 diabetes mellitus without complications: Secondary | ICD-10-CM | POA: Diagnosis not present

## 2017-02-16 ENCOUNTER — Telehealth: Payer: Self-pay

## 2017-02-16 ENCOUNTER — Other Ambulatory Visit: Payer: Self-pay

## 2017-02-16 NOTE — Telephone Encounter (Signed)
Pts insurance will not pay for Trulicity. Would you like for her to try a different med.

## 2017-02-16 NOTE — Telephone Encounter (Signed)
We will discuss her other options during her next visit.

## 2017-02-17 MED ORDER — INSULIN GLARGINE 100 UNIT/ML SOLOSTAR PEN: 70.0000 [IU] | PEN_INJECTOR | Freq: Every day | SUBCUTANEOUS | 0 refills | Status: DC

## 2017-02-17 NOTE — Telephone Encounter (Signed)
Pt.notified

## 2017-02-24 ENCOUNTER — Other Ambulatory Visit: Payer: Self-pay | Admitting: Neurology

## 2017-02-24 DIAGNOSIS — G35 Multiple sclerosis: Secondary | ICD-10-CM

## 2017-02-24 DIAGNOSIS — Z79899 Other long term (current) drug therapy: Secondary | ICD-10-CM

## 2017-02-24 MED ORDER — DONEPEZIL HCL 5 MG PO TABS: 5.0000 mg | ORAL_TABLET | Freq: Every day | ORAL | 3 refills | Status: DC

## 2017-02-25 ENCOUNTER — Other Ambulatory Visit: Payer: Self-pay

## 2017-02-25 MED ORDER — INSULIN PEN NEEDLE 31G X 8 MM MISC: 1.0000 | Freq: Two times a day (BID) | 2 refills | Status: DC

## 2017-03-01 DIAGNOSIS — N182 Chronic kidney disease, stage 2 (mild): Secondary | ICD-10-CM | POA: Diagnosis not present

## 2017-03-01 DIAGNOSIS — Z794 Long term (current) use of insulin: Secondary | ICD-10-CM | POA: Diagnosis not present

## 2017-03-01 DIAGNOSIS — E1122 Type 2 diabetes mellitus with diabetic chronic kidney disease: Secondary | ICD-10-CM | POA: Diagnosis not present

## 2017-03-02 LAB — COMPLETE METABOLIC PANEL WITH GFR
AG RATIO: 1.2 (calc) (ref 1.0–2.5)
ALBUMIN MSPROF: 3.7 g/dL (ref 3.6–5.1)
ALKALINE PHOSPHATASE (APISO): 86 U/L (ref 33–130)
ALT: 14 U/L (ref 6–29)
AST: 12 U/L (ref 10–35)
BILIRUBIN TOTAL: 0.3 mg/dL (ref 0.2–1.2)
BUN / CREAT RATIO: 31 (calc) — AB (ref 6–22)
BUN: 33 mg/dL — AB (ref 7–25)
CHLORIDE: 104 mmol/L (ref 98–110)
CO2: 29 mmol/L (ref 20–32)
Calcium: 9.3 mg/dL (ref 8.6–10.4)
Creat: 1.08 mg/dL — ABNORMAL HIGH (ref 0.50–1.05)
GFR, EST AFRICAN AMERICAN: 65 mL/min/{1.73_m2} (ref >=60)
GFR, Est Non African American: 56 mL/min/{1.73_m2} — ABNORMAL LOW (ref >=60)
GLOBULIN: 3 g/dL (ref 1.9–3.7)
GLUCOSE: 260 mg/dL — AB (ref 65–139)
Potassium: 4.6 mmol/L (ref 3.5–5.3)
SODIUM: 141 mmol/L (ref 135–146)
TOTAL PROTEIN: 6.7 g/dL (ref 6.1–8.1)

## 2017-03-02 LAB — HEMOGLOBIN A1C
HEMOGLOBIN A1C: 10.9 %{Hb} — AB (ref <5.7)
Mean Plasma Glucose: 266 (calc)
eAG (mmol/L): 14.7 (calc)

## 2017-03-02 LAB — TSH: TSH: 2.36 m[IU]/L (ref 0.40–4.50)

## 2017-03-02 LAB — T4, FREE: Free T4: 1.2 ng/dL (ref 0.8–1.8)

## 2017-03-09 ENCOUNTER — Encounter: Payer: Self-pay | Admitting: "Endocrinology

## 2017-03-09 ENCOUNTER — Ambulatory Visit (INDEPENDENT_AMBULATORY_CARE_PROVIDER_SITE_OTHER): Payer: Medicare Other | Admitting: "Endocrinology

## 2017-03-09 VITALS — BP 132/82 | Ht 64.0 in | Wt 245.0 lb

## 2017-03-09 DIAGNOSIS — E039 Hypothyroidism, unspecified: Secondary | ICD-10-CM

## 2017-03-09 DIAGNOSIS — E1122 Type 2 diabetes mellitus with diabetic chronic kidney disease: Secondary | ICD-10-CM | POA: Diagnosis not present

## 2017-03-09 DIAGNOSIS — I10 Essential (primary) hypertension: Secondary | ICD-10-CM | POA: Diagnosis not present

## 2017-03-09 DIAGNOSIS — E782 Mixed hyperlipidemia: Secondary | ICD-10-CM | POA: Diagnosis not present

## 2017-03-09 DIAGNOSIS — Z794 Long term (current) use of insulin: Secondary | ICD-10-CM | POA: Diagnosis not present

## 2017-03-09 DIAGNOSIS — N182 Chronic kidney disease, stage 2 (mild): Secondary | ICD-10-CM | POA: Diagnosis not present

## 2017-03-09 MED ORDER — DULAGLUTIDE 0.75 MG/0.5ML ~~LOC~~ SOAJ: 0.5000 mL | SUBCUTANEOUS | 0 refills | Status: DC

## 2017-03-09 MED ORDER — INSULIN GLARGINE 100 UNIT/ML SOLOSTAR PEN: 90.0000 [IU] | PEN_INJECTOR | Freq: Every day | SUBCUTANEOUS | 0 refills | Status: DC

## 2017-03-09 MED ORDER — DULAGLUTIDE 0.75 MG/0.5ML ~~LOC~~ SOAJ: 0.7500 mL | SUBCUTANEOUS | 0 refills | Status: DC

## 2017-03-09 NOTE — Progress Notes (Signed)
Subjective:    Patient ID: [name], female    DOB: 17-Aug-1955,  [name], MD   Past Medical History:  Diagnosis Date  . Diabetes mellitus without complication (HCC)   . High cholesterol   . Hypertension   . Hypothyroidism   . Morbid obesity (HCC)   . MS (multiple sclerosis) (HCC) relapsing remitting  . Severe obesity (BMI >= 40) (HCC) 10/11/2012   Past Surgical History:  Procedure Laterality Date  . BIOPSY  03/19/2015   Procedure: BIOPSY;  Surgeon: West Bali, MD;  Location: [facility];  Service: Endoscopy;;  random colon biopsy  . CHOLECYSTECTOMY  8/09  . COLONOSCOPY     about 7 years  . COLONOSCOPY WITH PROPOFOL N/A 03/19/2015   [id number]:[id number] ileum and colon/small internal hemorrhoids  . LEFT OOPHORECTOMY Left 8/11   Social History   Socioeconomic History  . Marital status: Single    Spouse name: Not on file  . Number of children: 0  . Years of education: college  . Highest education level: Not on file  Social Needs  . Financial resource strain: Not on file  . Food insecurity - worry: Not on file  . Food insecurity - inability: Not on file  . Transportation needs - medical: Not on file  . Transportation needs - non-medical: Not on file  Occupational History  . Occupation: disabled    Associate Professor: NOT EMPLOYED  Tobacco Use  . Smoking status: Never Smoker  . Smokeless tobacco: Never Used  Substance and Sexual Activity  . Alcohol use: No    Alcohol/week: 0.0 oz    Comment: quit in 1975  . Drug use: No  . Sexual activity: No    Birth control/protection: Post-menopausal  Other Topics Concern  . Not on file  Social History Narrative   Patient is single and lives alone.   Patient is right-handed.   Patient has a college education.   Patient does not drink any caffeine.   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   Outpatient Encounter Medications as of 03/09/2017  Medication Sig  . aspirin 81 MG tablet Take 81 mg by mouth daily.  Marland Kitchen  atorvastatin (LIPITOR) 40 MG tablet Take 40 mg by mouth daily.   . baclofen (LIORESAL) 10 MG tablet Take 1 tablet (10 mg total) 3 (three) times daily by mouth. (Patient taking differently: Take 10 mg by mouth 3 (three) times daily. Take 10 mg in am and 20mg  in pm)  . Calcium Carbonate (CALCIUM 600 PO) Take 1 tablet by mouth daily.  Marland Kitchen donepezil (ARICEPT) 5 MG tablet Take 1 tablet (5 mg total) by mouth at bedtime.  . Dulaglutide 0.75 MG/0.5ML SOPN Inject 0.5 mLs into the skin once a week.  . Ergocalciferol 2000 units CAPS Take 2,000 capsules by mouth daily.   Marland Kitchen glucose blood (ONE TOUCH ULTRA TEST) test strip Use as instructed 4 x daily. E11.65  . imipramine (TOFRANIL) 25 MG tablet Take 1 tablet (25 mg total) by mouth at bedtime.  . Insulin Glargine (LANTUS SOLOSTAR) 100 UNIT/ML Solostar Pen Inject 90 Units into the skin daily with breakfast.  . Insulin Pen Needle 31G X 8 MM MISC 1 each by Does not apply route 2 (two) times daily.  Marland Kitchen levothyroxine (SYNTHROID, LEVOTHROID) 25 MCG tablet Take 75 mcg daily before breakfast by mouth.  . metFORMIN (GLUCOPHAGE-XR) 500 MG 24 hr tablet Take 500 mg by mouth 2 (two) times daily.   Marland Kitchen  methenamine (HIPREX) 1 g tablet 2 (two) times daily with a meal.   . RELION PEN NEEDLES 32G X 4 MM MISC   . Tamsulosin HCl (FLOMAX) 0.4 MG CAPS Take 0.4 mg by mouth daily after breakfast.   . tolterodine (DETROL LA) 4 MG 24 hr capsule TAKE 1 CAPSULE DAILY  . TOPROL XL 25 MG 24 hr tablet Take 25 mg by mouth daily.   . [DISCONTINUED] Dulaglutide 0.75 MG/0.5ML SOPN Inject 0.5 mLs into the skin once a week.  . [DISCONTINUED] Insulin Glargine (LANTUS SOLOSTAR) 100 UNIT/ML Solostar Pen Inject 70 Units into the skin at bedtime. (Patient taking differently: Inject 80 Units into the skin at bedtime. )   No facility-administered encounter medications on file as of 03/09/2017.    ALLERGIES: Allergies  Allergen Reactions  . Penicillins Anaphylaxis    Has patient had a PCN reaction causing  immediate rash, facial/tongue/throat swelling, SOB or lightheadedness with hypotension: Yes Has patient had a PCN reaction causing severe rash involving mucus membranes or skin necrosis: No Has patient had a PCN reaction that required hospitalization No Has patient had a PCN reaction occurring within the last 10 years: No If all of the above answers are "NO", then may proceed with Cephalosporin use.   Marland Kitchen Lotensin [Benazepril Hcl] Cough   VACCINATION STATUS:  There is no immunization history on file for this patient.  Diabetes  She presents for her follow-up diabetic visit. She has type 2 diabetes mellitus. Onset time: She was diagnosed at approximate age of 52 years. Her disease course has been worsening. There are no hypoglycemic associated symptoms. Pertinent negatives for hypoglycemia include no confusion, headaches, pallor or seizures. Associated symptoms include fatigue. Pertinent negatives for diabetes include no chest pain, no polydipsia, no polyphagia and no polyuria. There are no hypoglycemic complications. Symptoms are worsening. Diabetic complications include nephropathy and peripheral neuropathy. Risk factors for coronary artery disease include diabetes mellitus, dyslipidemia, hypertension and sedentary lifestyle. Current diabetic treatment includes insulin injections and oral agent (monotherapy). She is compliant with treatment most of the time. Her weight is stable. She is following a generally unhealthy diet. She has had a previous visit with a dietitian. She never participates in exercise. Her home blood glucose trend is decreasing steadily. Her breakfast blood glucose range is generally 180-200 mg/dl. Her bedtime blood glucose range is generally >200 mg/dl. Her overall blood glucose range is >200 mg/dl. An ACE inhibitor/angiotensin II receptor blocker is being taken.  Thyroid Problem  Presents for follow-up visit. Symptoms include fatigue. Patient reports no cold intolerance, diarrhea,  heat intolerance or palpitations. The symptoms have been improving. Past treatments include levothyroxine. Her past medical history is significant for diabetes and hyperlipidemia.  Hyperlipidemia  This is a chronic problem. The current episode started more than 1 year ago. Exacerbating diseases include diabetes, hypothyroidism and obesity. Pertinent negatives include no chest pain, myalgias or shortness of breath. Current antihyperlipidemic treatment includes statins. Risk factors for coronary artery disease include diabetes mellitus, dyslipidemia, hypertension, obesity, a sedentary lifestyle and post-menopausal.  Hypertension  This is a chronic problem. The current episode started more than 1 year ago. Pertinent negatives include no chest pain, headaches, palpitations or shortness of breath. Risk factors for coronary artery disease include dyslipidemia, diabetes mellitus, obesity, sedentary lifestyle, family history and post-menopausal state. Past treatments include angiotensin blockers. Identifiable causes of hypertension include a thyroid problem.    Review of Systems  Constitutional: Positive for fatigue. Negative for unexpected weight change.  HENT: Negative for  trouble swallowing and voice change.   Eyes: Negative for visual disturbance.  Respiratory: Negative for cough, shortness of breath and wheezing.   Cardiovascular: Negative for chest pain, palpitations and leg swelling.  Gastrointestinal: Negative for diarrhea, nausea and vomiting.  Endocrine: Negative for cold intolerance, heat intolerance, polydipsia, polyphagia and polyuria.  Musculoskeletal: Negative for arthralgias and myalgias.  Skin: Negative for color change, pallor, rash and wound.  Neurological: Negative for seizures and headaches.  Psychiatric/Behavioral: Negative for confusion and suicidal ideas.    Objective:    BP 132/82   Ht 5\' 4"  (1.626 m)   Wt 245 lb (111.1 kg)   BMI 42.05 kg/m   Wt Readings from Last 3  Encounters:  03/09/17 245 lb (111.1 kg)  12/23/16 242 lb (109.8 kg)  12/08/16 244 lb (110.7 kg)    Physical Exam  Constitutional: She is oriented to person, place, and time. She appears well-developed.  HENT:  Head: Normocephalic and atraumatic.  Eyes: EOM are normal.  Neck: Normal range of motion. Neck supple. No tracheal deviation present. No thyromegaly present.  Cardiovascular: Normal rate and regular rhythm.  Pulmonary/Chest: Effort normal and breath sounds normal.  Abdominal: Soft. Bowel sounds are normal. There is no tenderness. There is no guarding.  Musculoskeletal: She exhibits no edema.  She walks with her walker due to multiple sclerosis.  Neurological: She is alert and oriented to person, place, and time. She has normal reflexes. No cranial nerve deficit. Coordination normal.  Skin: Skin is warm and dry. No rash noted. No erythema. No pallor.  Psychiatric: She has a normal mood and affect. Judgment normal.    Results for orders placed or performed in visit on 12/08/16  COMPLETE METABOLIC PANEL WITH GFR  Result Value Ref Range   Glucose, Bld 260 (H) 65 - 139 mg/dL   BUN 33 (H) 7 - 25 mg/dL   Creat 1.61 (H) 0.96 - 1.05 mg/dL   GFR, Est Non African American 56 (L) > OR = 60 mL/min/1.45m2   GFR, Est African American 65 > OR = 60 mL/min/1.11m2   BUN/Creatinine Ratio 31 (H) 6 - 22 (calc)   Sodium 141 135 - 146 mmol/L   Potassium 4.6 3.5 - 5.3 mmol/L   Chloride 104 98 - 110 mmol/L   CO2 29 20 - 32 mmol/L   Calcium 9.3 8.6 - 10.4 mg/dL   Total Protein 6.7 6.1 - 8.1 g/dL   Albumin 3.7 3.6 - 5.1 g/dL   Globulin 3.0 1.9 - 3.7 g/dL (calc)   AG Ratio 1.2 1.0 - 2.5 (calc)   Total Bilirubin 0.3 0.2 - 1.2 mg/dL   Alkaline phosphatase (APISO) 86 33 - 130 U/L   AST 12 10 - 35 U/L   ALT 14 6 - 29 U/L  Hemoglobin A1c  Result Value Ref Range   Hgb A1c MFr Bld 10.9 (H) <5.7 % of total Hgb   Mean Plasma Glucose 266 (calc)   eAG (mmol/L) 14.7 (calc)  TSH  Result Value Ref Range    TSH 2.36 0.40 - 4.50 mIU/L  T4, free  Result Value Ref Range   Free T4 1.2 0.8 - 1.8 ng/dL   Complete Blood Count (Most recent): Lab Results  Component Value Date   WBC 6.6 11/09/2016   HGB 11.8 11/09/2016   HCT 35.8 11/09/2016   MCV 81 11/09/2016   PLT 367 11/09/2016   Chemistry (most recent): Lab Results  Component Value Date   NA 141 03/01/2017   K  4.6 03/01/2017   CL 104 03/01/2017   CO2 29 03/01/2017   BUN 33 (H) 03/01/2017   CREATININE 1.08 (H) 03/01/2017   Diabetic Labs (most recent): Lab Results  Component Value Date   HGBA1C 10.9 (H) 03/01/2017   HGBA1C 8.6 (H) 12/01/2016   HGBA1C 9.3 (H) 08/13/2016   Lipid Panel     Component Value Date/Time   CHOL 148 03/17/2016 1228   TRIG 117 03/17/2016 1228   HDL 55 03/17/2016 1228   CHOLHDL 2.7 03/17/2016 1228   VLDL 23 03/17/2016 1228   LDLCALC 70 03/17/2016 1228    Assessment & Plan:   1. Type 2 diabetes mellitus with stage 2 chronic kidney disease, with long-term current use of insulin (HCC)  Her diabetes is  complicated by  Improving CKD and patient remains at a high risk for more acute and chronic complications of diabetes which include CAD, CVA, CKD, retinopathy, and neuropathy. These are all discussed in detail with the patient. - She is her last visit, she is getting a big help from her sister , Elita Quick,  who is also a Advice worker who works in [facility]. - She makes sure that Bria gets her medications especially the morning injections of insulin.  Her A1c has increased to 10.9% from 8.6% last visit.  This is mainly due to 2 courses of steroid therapy related to her MS in the interval.   - I have re-counseled the patient on diet management and weight loss  by adopting a carbohydrate restricted / protein rich  Diet.  -  Suggestion is made for her to avoid simple carbohydrates  from her diet including Cakes, Sweet Desserts / Pastries, Ice Cream, Soda (diet and regular), Sweet Tea, Candies,  Chips, Cookies, Store Bought Juices, Alcohol in Excess of  1-2 drinks a day, Artificial Sweeteners, and "Sugar-free" Products. This will help patient to have stable blood glucose profile and potentially avoid unintended weight gain.  - Patient is advised to stick to a routine mealtimes to eat 3 meals  a day and avoid unnecessary snacks ( to snack only to correct hypoglycemia).  - I have approached patient with the following individualized plan to manage diabetes and patient agrees.  - She lost control of her diabetes with A1c of 10.9%. -She may need intensive treatment with basal/bolus insulin by next visit.  -For now, I have advised her to increase her Lantus to 90 units daily in the morning with breakfast, associated with strict monitoring of blood glucose 2 times a day-before breakfast  and at bedtime. -Patient is encouraged to call clinic for blood glucose levels less than 70 or above 200 mg /dl. -For better insulin sensitivity I will continue metformin ER  at lower dose of 500mg  po twice a day.  -Due to CKD patient is not a candidate for SGLT2 inhibitors. -  I gave her samples for 0.75 mg weekly for 4 weeks, prescribed for more. -Target numbers for A1c, LDL, HDL, Triglycerides, Waist Circumference were discussed in detail.   - Patient specific target  for A1c; LDL, HDL, Triglycerides, and  Waist Circumference were discussed in detail.  2) BP/HTN: Her blood pressure is controlled to target.   Continue current medications including ACEI/ARB. 3) Lipids/HPL: Controlled with LDL 70.  She is advised to continue atorvastatin 40 mg p.o. nightly.   4)  Weight/Diet: CDE consult in progress, exercise, and carbohydrates information provided.  5)  Primary hypothyroidism -Her thyroid function tests are consistent with appropriate replacement.   -  I advised her to continue levothyroxine 75 mcg p.o. every morning. - We discussed about correct intake of levothyroxine, at fasting, with water, separated by  at least 30 minutes from breakfast, and separated by more than 4 hours from calcium, iron, multivitamins, acid reflux medications (PPIs). -Patient is made aware of the fact that thyroid hormone replacement is needed for life, dose to be adjusted by periodic monitoring of thyroid function tests.   6) Chronic Care/Health Maintenance:  -Patient is on ACEI/ARB and Statin medications and encouraged to continue to follow up with Ophthalmology, Podiatrist at least yearly or according to recommendations, and advised to  stay away from smoking. I have recommended yearly flu vaccine and pneumonia vaccination at least every 5 years; and  sleep for at least 7 hours a day.  I advised patient to maintain close follow up with her PCP for primary care needs.  - Time spent with the patient: 25 min, of which >50% was spent in reviewing her blood glucose logs , discussing her hypo- and hyper-glycemic episodes, reviewing her current and  previous labs and insulin doses and developing a plan to avoid hypo- and hyper-glycemia. Please refer to Patient Instructions for Blood Glucose Monitoring and Insulin/Medications Dosing Guide"  in media tab for additional information.  Follow up plan: Return in about 3 months (around 06/06/2017) for follow up with pre-visit labs, meter, and logs.  Marquis Lunch, MD Phone: [phone]  Fax: [phone]   03/09/2017, 4:51 PM

## 2017-03-09 NOTE — Patient Instructions (Signed)

## 2017-03-11 ENCOUNTER — Telehealth: Payer: Self-pay

## 2017-03-11 NOTE — Telephone Encounter (Signed)
Pts pharmacy called and states that the Trulicity copay will be $525

## 2017-03-11 NOTE — Telephone Encounter (Signed)
She does not have to get it. She will stay on her insulin and metformin until next visit.

## 2017-03-17 ENCOUNTER — Telehealth: Payer: Self-pay | Admitting: "Endocrinology

## 2017-03-17 MED ORDER — DULAGLUTIDE 0.75 MG/0.5ML ~~LOC~~ SOAJ: 0.7500 mL | SUBCUTANEOUS | 0 refills | Status: DC

## 2017-03-17 MED ORDER — INSULIN GLARGINE 100 UNIT/ML SOLOSTAR PEN: 90.0000 [IU] | PEN_INJECTOR | Freq: Every day | SUBCUTANEOUS | 0 refills | Status: DC

## 2017-03-17 NOTE — Telephone Encounter (Signed)
[name] is needing refills on Insulin Glargine (LANTUS SOLOSTAR) 100 UNIT/ML Solostar Pen and the Trulicity sent to [facility], Per Beckey Downing, please advise?

## 2017-03-17 NOTE — Telephone Encounter (Signed)
Rx sent 

## 2017-03-25 DIAGNOSIS — E119 Type 2 diabetes mellitus without complications: Secondary | ICD-10-CM | POA: Diagnosis not present

## 2017-03-25 DIAGNOSIS — I10 Essential (primary) hypertension: Secondary | ICD-10-CM | POA: Diagnosis not present

## 2017-03-25 DIAGNOSIS — E7800 Pure hypercholesterolemia, unspecified: Secondary | ICD-10-CM | POA: Diagnosis not present

## 2017-04-02 DIAGNOSIS — E119 Type 2 diabetes mellitus without complications: Secondary | ICD-10-CM | POA: Diagnosis not present

## 2017-04-28 ENCOUNTER — Ambulatory Visit: Payer: Medicare Other | Admitting: Nurse Practitioner

## 2017-05-04 DIAGNOSIS — E119 Type 2 diabetes mellitus without complications: Secondary | ICD-10-CM | POA: Diagnosis not present

## 2017-05-04 DIAGNOSIS — I10 Essential (primary) hypertension: Secondary | ICD-10-CM | POA: Diagnosis not present

## 2017-05-04 DIAGNOSIS — E7800 Pure hypercholesterolemia, unspecified: Secondary | ICD-10-CM | POA: Diagnosis not present

## 2017-05-24 ENCOUNTER — Telehealth: Payer: Self-pay | Admitting: "Endocrinology

## 2017-05-24 MED ORDER — METFORMIN HCL ER 500 MG PO TB24: 500.0000 mg | ORAL_TABLET | Freq: Two times a day (BID) | ORAL | 0 refills | Status: DC

## 2017-05-24 NOTE — Telephone Encounter (Signed)
rx sent

## 2017-05-24 NOTE — Telephone Encounter (Signed)
[name] is calling stating that [name] is needing a Rx sent to [facility] for metFORMIN (GLUCOPHAGE-XR) 500 MG 24 hr tablet please advise?

## 2017-06-04 ENCOUNTER — Other Ambulatory Visit: Payer: Self-pay | Admitting: Neurology

## 2017-06-11 DIAGNOSIS — Z794 Long term (current) use of insulin: Secondary | ICD-10-CM | POA: Diagnosis not present

## 2017-06-11 DIAGNOSIS — E1122 Type 2 diabetes mellitus with diabetic chronic kidney disease: Secondary | ICD-10-CM | POA: Diagnosis not present

## 2017-06-11 DIAGNOSIS — N182 Chronic kidney disease, stage 2 (mild): Secondary | ICD-10-CM | POA: Diagnosis not present

## 2017-06-12 LAB — COMPLETE METABOLIC PANEL WITH GFR
AG Ratio: 1.2 (calc) (ref 1.0–2.5)
ALT: 14 U/L (ref 6–29)
AST: 13 U/L (ref 10–35)
Albumin: 3.8 g/dL (ref 3.6–5.1)
Alkaline phosphatase (APISO): 84 U/L (ref 33–130)
BILIRUBIN TOTAL: 0.3 mg/dL (ref 0.2–1.2)
BUN/Creatinine Ratio: 38 (calc) — ABNORMAL HIGH (ref 6–22)
BUN: 36 mg/dL — AB (ref 7–25)
CALCIUM: 8.9 mg/dL (ref 8.6–10.4)
CHLORIDE: 105 mmol/L (ref 98–110)
CO2: 28 mmol/L (ref 20–32)
Creat: 0.94 mg/dL (ref 0.50–1.05)
GFR, EST NON AFRICAN AMERICAN: 66 mL/min/{1.73_m2} (ref >=60)
GFR, Est African American: 77 mL/min/{1.73_m2} (ref >=60)
GLUCOSE: 126 mg/dL (ref 65–139)
Globulin: 3.1 g/dL (calc) (ref 1.9–3.7)
POTASSIUM: 4.3 mmol/L (ref 3.5–5.3)
Sodium: 141 mmol/L (ref 135–146)
TOTAL PROTEIN: 6.9 g/dL (ref 6.1–8.1)

## 2017-06-12 LAB — HEMOGLOBIN A1C
Hgb A1c MFr Bld: 6.9 % of total Hgb — ABNORMAL HIGH (ref <5.7)
Mean Plasma Glucose: 151 (calc)
eAG (mmol/L): 8.4 (calc)

## 2017-06-14 ENCOUNTER — Encounter: Payer: Self-pay | Admitting: "Endocrinology

## 2017-06-14 ENCOUNTER — Ambulatory Visit (INDEPENDENT_AMBULATORY_CARE_PROVIDER_SITE_OTHER): Payer: Medicare Other | Admitting: "Endocrinology

## 2017-06-14 VITALS — BP 135/85 | HR 89 | Ht 64.0 in | Wt 237.0 lb

## 2017-06-14 DIAGNOSIS — E1122 Type 2 diabetes mellitus with diabetic chronic kidney disease: Secondary | ICD-10-CM | POA: Diagnosis not present

## 2017-06-14 DIAGNOSIS — E039 Hypothyroidism, unspecified: Secondary | ICD-10-CM

## 2017-06-14 DIAGNOSIS — I10 Essential (primary) hypertension: Secondary | ICD-10-CM

## 2017-06-14 DIAGNOSIS — E782 Mixed hyperlipidemia: Secondary | ICD-10-CM

## 2017-06-14 DIAGNOSIS — N182 Chronic kidney disease, stage 2 (mild): Secondary | ICD-10-CM

## 2017-06-14 DIAGNOSIS — Z794 Long term (current) use of insulin: Secondary | ICD-10-CM | POA: Diagnosis not present

## 2017-06-14 MED ORDER — INSULIN GLARGINE 100 UNIT/ML SOLOSTAR PEN: 60.0000 [IU] | PEN_INJECTOR | Freq: Every day | SUBCUTANEOUS | 2 refills | Status: DC

## 2017-06-14 NOTE — Patient Instructions (Signed)

## 2017-06-14 NOTE — Progress Notes (Signed)
Subjective:    Patient ID: [name], female    DOB: 1955/12/20,  Margaret Specking, MD   Past Medical History:  Diagnosis Date  . Diabetes mellitus without complication (HCC)   . High cholesterol   . Hypertension   . Hypothyroidism   . Morbid obesity (HCC)   . MS (multiple sclerosis) (HCC) relapsing remitting  . Severe obesity (BMI >= 40) (HCC) 10/11/2012   Past Surgical History:  Procedure Laterality Date  . BIOPSY  03/19/2015   Procedure: BIOPSY;  Surgeon: West Bali, MD;  Location: [facility];  Service: Endoscopy;;  random colon biopsy  . CHOLECYSTECTOMY  8/09  . COLONOSCOPY     about 7 years  . COLONOSCOPY WITH PROPOFOL N/A 03/19/2015   ZOX:[id number] ileum and colon/small internal hemorrhoids  . LEFT OOPHORECTOMY Left 8/11   Social History   Socioeconomic History  . Marital status: Single    Spouse name: Not on file  . Number of children: 0  . Years of education: college  . Highest education level: Not on file  Occupational History  . Occupation: disabled    Associate Professor: NOT EMPLOYED  Social Needs  . Financial resource strain: Not on file  . Food insecurity:    Worry: Not on file    Inability: Not on file  . Transportation needs:    Medical: Not on file    Non-medical: Not on file  Tobacco Use  . Smoking status: Never Smoker  . Smokeless tobacco: Never Used  Substance and Sexual Activity  . Alcohol use: No    Alcohol/week: 0.0 oz    Comment: quit in 1975  . Drug use: No  . Sexual activity: Never    Birth control/protection: Post-menopausal  Lifestyle  . Physical activity:    Days per week: Not on file    Minutes per session: Not on file  . Stress: Not on file  Relationships  . Social connections:    Talks on phone: Not on file    Gets together: Not on file    Attends religious service: Not on file    Active member of club or organization: Not on file    Attends meetings of clubs or organizations: Not on file    Relationship status: Not on  file  Other Topics Concern  . Not on file  Social History Narrative   Patient is single and lives alone.   Patient is right-handed.   Patient has a college education.   Patient does not drink any caffeine.   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   Outpatient Encounter Medications as of 06/14/2017  Medication Sig  . aspirin 81 MG tablet Take 81 mg by mouth daily.  Marland Kitchen atorvastatin (LIPITOR) 40 MG tablet Take 40 mg by mouth daily.   . baclofen (LIORESAL) 10 MG tablet Take 1 tablet (10 mg total) 3 (three) times daily by mouth. (Patient taking differently: Take 10 mg by mouth 3 (three) times daily. Take 10 mg in am and 20mg  in pm)  . Calcium Carbonate (CALCIUM 600 PO) Take 1 tablet by mouth daily.  Marland Kitchen donepezil (ARICEPT) 5 MG tablet Take 1 tablet (5 mg total) by mouth at bedtime.  . Dulaglutide 0.75 MG/0.5ML SOPN Inject 0.75 mLs into the skin once a week.  . Ergocalciferol 2000 units CAPS Take 2,000 capsules by mouth daily.   Marland Kitchen glucose blood (ONE TOUCH ULTRA TEST) test strip Use as instructed 4 x daily. E11.65  .  imipramine (TOFRANIL) 25 MG tablet TAKE 1 TABLET AT BEDTIME  . Insulin Pen Needle 31G X 8 MM MISC 1 each by Does not apply route 2 (two) times daily.  Marland Kitchen levothyroxine (SYNTHROID, LEVOTHROID) 25 MCG tablet Take 75 mcg daily before breakfast by mouth.  . metFORMIN (GLUCOPHAGE-XR) 500 MG 24 hr tablet Take 1 tablet (500 mg total) by mouth 2 (two) times daily.  . methenamine (HIPREX) 1 g tablet 2 (two) times daily with a meal.   . RELION PEN NEEDLES 32G X 4 MM MISC   . Tamsulosin HCl (FLOMAX) 0.4 MG CAPS Take 0.4 mg by mouth daily after breakfast.   . tolterodine (DETROL LA) 4 MG 24 hr capsule TAKE 1 CAPSULE DAILY  . TOPROL XL 25 MG 24 hr tablet Take 25 mg by mouth daily.   . [DISCONTINUED] Insulin Glargine (LANTUS SOLOSTAR) 100 UNIT/ML Solostar Pen Inject 90 Units into the skin daily with breakfast.  . Insulin Glargine (LANTUS SOLOSTAR) 100 UNIT/ML Solostar Pen Inject 60  Units into the skin daily with breakfast.   No facility-administered encounter medications on file as of 06/14/2017.    ALLERGIES: Allergies  Allergen Reactions  . Penicillins Anaphylaxis    Has patient had a PCN reaction causing immediate rash, facial/tongue/throat swelling, SOB or lightheadedness with hypotension: Yes Has patient had a PCN reaction causing severe rash involving mucus membranes or skin necrosis: No Has patient had a PCN reaction that required hospitalization No Has patient had a PCN reaction occurring within the last 10 years: No If all of the above answers are "NO", then may proceed with Cephalosporin use.   Marland Kitchen Lotensin [Benazepril Hcl] Cough   VACCINATION STATUS:  There is no immunization history on file for this patient.  Diabetes  She presents for her follow-up diabetic visit. She has type 2 diabetes mellitus. Onset time: She was diagnosed at approximate age of 52 years. Her disease course has been improving. There are no hypoglycemic associated symptoms. Pertinent negatives for hypoglycemia include no confusion, headaches, pallor or seizures. Associated symptoms include fatigue. Pertinent negatives for diabetes include no chest pain, no polydipsia, no polyphagia and no polyuria. There are no hypoglycemic complications. Symptoms are improving. Diabetic complications include nephropathy and peripheral neuropathy. Risk factors for coronary artery disease include diabetes mellitus, dyslipidemia, hypertension and sedentary lifestyle. Current diabetic treatment includes insulin injections and oral agent (monotherapy). She is compliant with treatment most of the time. Her weight is decreasing steadily. She is following a generally unhealthy diet. She has had a previous visit with a dietitian. She never participates in exercise. Her home blood glucose trend is decreasing steadily. Her breakfast blood glucose range is generally 130-140 mg/dl. Her bedtime blood glucose range is  generally 140-180 mg/dl. Her overall blood glucose range is 130-140 mg/dl. An ACE inhibitor/angiotensin II receptor blocker is being taken.  Thyroid Problem  Presents for follow-up visit. Symptoms include fatigue. Patient reports no cold intolerance, diarrhea, heat intolerance or palpitations. The symptoms have been improving. Past treatments include levothyroxine. Her past medical history is significant for diabetes and hyperlipidemia.  Hyperlipidemia  This is a chronic problem. The current episode started more than 1 year ago. Exacerbating diseases include diabetes, hypothyroidism and obesity. Pertinent negatives include no chest pain, myalgias or shortness of breath. Current antihyperlipidemic treatment includes statins. Risk factors for coronary artery disease include diabetes mellitus, dyslipidemia, hypertension, obesity, a sedentary lifestyle and post-menopausal.  Hypertension  This is a chronic problem. The current episode started more than 1 year ago.  Pertinent negatives include no chest pain, headaches, palpitations or shortness of breath. Risk factors for coronary artery disease include dyslipidemia, diabetes mellitus, obesity, sedentary lifestyle, family history and post-menopausal state. Past treatments include angiotensin blockers. Identifiable causes of hypertension include a thyroid problem.    Review of Systems  Constitutional: Positive for fatigue. Negative for unexpected weight change.  HENT: Negative for trouble swallowing and voice change.   Eyes: Negative for visual disturbance.  Respiratory: Negative for cough, shortness of breath and wheezing.   Cardiovascular: Negative for chest pain, palpitations and leg swelling.  Gastrointestinal: Negative for diarrhea, nausea and vomiting.  Endocrine: Negative for cold intolerance, heat intolerance, polydipsia, polyphagia and polyuria.  Musculoskeletal: Negative for arthralgias and myalgias.  Skin: Negative for color change, pallor, rash  and wound.  Neurological: Negative for seizures and headaches.  Psychiatric/Behavioral: Negative for confusion and suicidal ideas.    Objective:    BP 135/85   Pulse 89   Ht 5\' 4"  (1.626 m)   Wt 237 lb (107.5 kg)   BMI 40.68 kg/m   Wt Readings from Last 3 Encounters:  06/14/17 237 lb (107.5 kg)  03/09/17 245 lb (111.1 kg)  12/23/16 242 lb (109.8 kg)    Physical Exam  Constitutional: She is oriented to person, place, and time. She appears well-developed.  HENT:  Head: Normocephalic and atraumatic.  Eyes: EOM are normal.  Neck: Normal range of motion. Neck supple. No tracheal deviation present. No thyromegaly present.  Cardiovascular: Normal rate.  Pulmonary/Chest: Effort normal.  Abdominal: There is no tenderness. There is no guarding.  Musculoskeletal: She exhibits no edema.  She walks with her walker due to multiple sclerosis.  Neurological: She is alert and oriented to person, place, and time. She has normal reflexes. No cranial nerve deficit. Coordination normal.  Skin: Skin is warm and dry. No rash noted. No erythema. No pallor.  Psychiatric: She has a normal mood and affect. Judgment normal.    Results for orders placed or performed in visit on 03/09/17  COMPLETE METABOLIC PANEL WITH GFR  Result Value Ref Range   Glucose, Bld 126 65 - 139 mg/dL   BUN 36 (H) 7 - 25 mg/dL   Creat 4.09 8.11 - 9.14 mg/dL   GFR, Est Non African American 66 > OR = 60 mL/min/1.53m2   GFR, Est African American 77 > OR = 60 mL/min/1.54m2   BUN/Creatinine Ratio 38 (H) 6 - 22 (calc)   Sodium 141 135 - 146 mmol/L   Potassium 4.3 3.5 - 5.3 mmol/L   Chloride 105 98 - 110 mmol/L   CO2 28 20 - 32 mmol/L   Calcium 8.9 8.6 - 10.4 mg/dL   Total Protein 6.9 6.1 - 8.1 g/dL   Albumin 3.8 3.6 - 5.1 g/dL   Globulin 3.1 1.9 - 3.7 g/dL (calc)   AG Ratio 1.2 1.0 - 2.5 (calc)   Total Bilirubin 0.3 0.2 - 1.2 mg/dL   Alkaline phosphatase (APISO) 84 33 - 130 U/L   AST 13 10 - 35 U/L   ALT 14 6 - 29 U/L   Hemoglobin A1c  Result Value Ref Range   Hgb A1c MFr Bld 6.9 (H) <5.7 % of total Hgb   Mean Plasma Glucose 151 (calc)   eAG (mmol/L) 8.4 (calc)   Complete Blood Count (Most recent): Lab Results  Component Value Date   WBC 6.6 11/09/2016   HGB 11.8 11/09/2016   HCT 35.8 11/09/2016   MCV 81 11/09/2016   PLT 367  11/09/2016   Chemistry (most recent): Lab Results  Component Value Date   NA 141 06/11/2017   K 4.3 06/11/2017   CL 105 06/11/2017   CO2 28 06/11/2017   BUN 36 (H) 06/11/2017   CREATININE 0.94 06/11/2017   Diabetic Labs (most recent): Lab Results  Component Value Date   HGBA1C 6.9 (H) 06/11/2017   HGBA1C 10.9 (H) 03/01/2017   HGBA1C 8.6 (H) 12/01/2016   Lipid Panel     Component Value Date/Time   CHOL 148 03/17/2016 1228   TRIG 117 03/17/2016 1228   HDL 55 03/17/2016 1228   CHOLHDL 2.7 03/17/2016 1228   VLDL 23 03/17/2016 1228   LDLCALC 70 03/17/2016 1228    Assessment & Plan:   1. Type 2 diabetes mellitus with stage 2 chronic kidney disease, with long-term current use of insulin (HCC)  -Her CKD has reversed,  patient remains at a high risk for more acute and chronic complications of diabetes which include CAD, CVA, CKD, retinopathy, and neuropathy. These are all discussed in detail with the patient. - Lately,  she is getting a big help from her sisters , Elita Quick and [name].  - They make sure that Evelyn gets her medications especially the morning injections of insulin.  Her A1c has improved to 6.9% from 10.9%.    - I have re-counseled the patient on diet management and weight loss  by adopting a carbohydrate restricted / protein rich  Diet.  -  Suggestion is made for her to avoid simple carbohydrates  from her diet including Cakes, Sweet Desserts / Pastries, Ice Cream, Soda (diet and regular), Sweet Tea, Candies, Chips, Cookies, Store Bought Juices, Alcohol in Excess of  1-2 drinks a day, Artificial Sweeteners, and "Sugar-free" Products. This will help patient  to have stable blood glucose profile and potentially avoid unintended weight gain.  - Patient is advised to stick to a routine mealtimes to eat 3 meals  a day and avoid unnecessary snacks ( to snack only to correct hypoglycemia).  - I have approached patient with the following individualized plan to manage diabetes and patient agrees.  -On her presentation, she will not need prandial insulin at this time.    -I advised [name] to continue lowered dose of Lantus at 60 units q. a.m. with breakfast,  associated with strict monitoring of blood glucose 2 times a day-before breakfast  and at bedtime. -Patient is encouraged to call clinic for blood glucose levels less than 70 or above 200 mg /dl. -For better insulin sensitivity I will continue metformin ER  at lower dose of 500mg  po twice a day.  -Due to CKD patient is not a candidate for SGLT2 inhibitors. -She is benefiting from the weekly injectable incretin therapy, I advised her to continue Trulicity 0.75 mg weekly.  -Target numbers for A1c, LDL, HDL, Triglycerides, Waist Circumference were discussed in detail.   - Patient specific target  for A1c; LDL, HDL, Triglycerides, and  Waist Circumference were discussed in detail.  2) BP/HTN: Her blood pressure is controlled to target.   Continue current medications including ACEI/ARB. 3) Lipids/HPL: Controlled with LDL 70.  She is advised to continue atorvastatin 40 mg p.o. nightly.   4)  Weight/Diet: CDE consult in progress, exercise, and carbohydrates information provided.  5)  Primary hypothyroidism -Her thyroid function tests are consistent with appropriate replacement.   -I advised her to continue levothyroxine 75 mcg p.o. every morning. - We discussed about correct intake of levothyroxine, at fasting, with water,  separated by at least 30 minutes from breakfast, and separated by more than 4 hours from calcium, iron, multivitamins, acid reflux medications (PPIs). -Patient is made aware of the fact  that thyroid hormone replacement is needed for life, dose to be adjusted by periodic monitoring of thyroid function tests.   6) Chronic Care/Health Maintenance:  -Patient is on ACEI/ARB and Statin medications and encouraged to continue to follow up with Ophthalmology, Podiatrist at least yearly or according to recommendations, and advised to  stay away from smoking. I have recommended yearly flu vaccine and pneumonia vaccination at least every 5 years; and  sleep for at least 7 hours a day.  I advised patient to maintain close follow up with her PCP for primary care needs.  - Time spent with the patient: 25 min, of which >50% was spent in reviewing her blood glucose logs , discussing her hypo- and hyper-glycemic episodes, reviewing her current and  previous labs and insulin doses and developing a plan to avoid hypo- and hyper-glycemia. Please refer to Patient Instructions for Blood Glucose Monitoring and Insulin/Medications Dosing Guide"  in media tab for additional information. [name] participated in the discussions, expressed understanding, and voiced agreement with the above plans.  All questions were answered to her satisfaction. she is encouraged to contact clinic should she have any questions or concerns prior to her return visit.  Follow up plan: Return in about 3 months (around 09/14/2017) for follow up with pre-visit labs, meter, and logs.  Marquis Lunch, MD Phone: [phone]  Fax: [phone]   06/14/2017, 12:23 PM

## 2017-06-15 DIAGNOSIS — Z299 Encounter for prophylactic measures, unspecified: Secondary | ICD-10-CM | POA: Diagnosis not present

## 2017-06-15 DIAGNOSIS — E1165 Type 2 diabetes mellitus with hyperglycemia: Secondary | ICD-10-CM | POA: Diagnosis not present

## 2017-06-15 DIAGNOSIS — G35 Multiple sclerosis: Secondary | ICD-10-CM | POA: Diagnosis not present

## 2017-06-15 DIAGNOSIS — Z6841 Body Mass Index (BMI) 40.0 and over, adult: Secondary | ICD-10-CM | POA: Diagnosis not present

## 2017-06-15 DIAGNOSIS — I10 Essential (primary) hypertension: Secondary | ICD-10-CM | POA: Diagnosis not present

## 2017-06-16 NOTE — Progress Notes (Signed)
[facility]  PATIENT: [name] DOB: [birth year]   REASON FOR VISIT: Follow-up for mild cognitive impairment, multiple sclerosis HISTORY FROM: Patient and sister    HISTORY OF PRESENT ILLNESS:UPDATE 5/3/2019CM Ms. [name], 62 year old female returns for follow-up with history of multiple sclerosis and mild cognitive impairment.  She was placed on Aricept by Dr. Vickey Huger and patient and sister has seen improvement.  MMSE today 29 out of 30 in addition she has been placed on Ocrevus IV infusion for her multiple sclerosis.  Her next infusion is July 22nd.  She continues to use her rolling walker for ambulation.  She has not had any falls.  She returns for reevaluation without any further neurologic complaints.   Interval history from 23 December 2016, I am seeing [name] here today long-established patient with MS and recent cognitive decline, in the presence of her sister [name], [place].  In our last visit 6 weeks ago we had discussed to start endocrine fluids for the treatment of MS which helps her to be compliant. The first infusion is scheduled for December 27 th.  In preparation we had obtained a blood test for tuberculosis was returned negative,  negative hepatitis panel, comprehensive metabolic panel and CBC.  Her creatinine is a slight bit elevated, but her glomerular filtration rate was 59 -which is still considered normal.   She also had high blood sugars.  White and red blood cell counts were normal region.There was no anemia.  The patient's MRI from 23 November 2016 showed multiple T2 and FLAIR hyperintense foci in the pattern and configuration consistent with demyelinating plaques.  This is a chronic multiple sclerosis as none of the foci appear to be acute.  I consider the patient a good candidate for OCREVUS. I was pleasantly surprised to see the patient looking brighter, more alert and certainly overall healthy.today .  She has eaten more regularly  and more fresh food, she also has more social interaction leaves the house more often.  Appears more alert.  She is able to walk with her  4 wheeled walker. Mrs Carleene Hanson felt that Aricept has had a positive effect on her sister.  We will repeat a MOCA/ MMSE in 3 month , next visit with NP  11-09-2016 [name] is a 62 y.o. female  Is seen here as a revisit  from Dr. Sherril Croon for multiple sclerosis follow up.  I had the pleasure of seeing [name] today in the presence of her sister, physician assistant [name]. I know Rinaldo Cloud from the [facility] radiology department where she has worked with the interventional team. I last saw [name] in late 2017 when our goal was to change her to a medication of infusion character, namely OCREVUS. At the time she had an MRI with out contrast that confirmed that and as was her diagnosis, but could not 100% differentiate between acute and more remote lesions. Now, almost a year later when it again as [name] had missed some of her appointments. From now on she will bring her sister with her. My goal is still to change the patient to a therapy that doesn't need to daily or every other day compliance neither pill no injection but rather an infusion therapy here in office. Low compliance would therefore not be as impairing. I would still like to change her to OCREVUS- but will have to repeat the hepatitis panel, the gold tuberculosis test and the patient information form.  She is  not driving any longer but lives alone- close to her sister. CD   The patient had followed [name] for over 20 years , diagnosed since 1989 ,, and is now mainly seen by [name], NP. She has never been on another medication, but Betaseron.  In my last visit , 2013 , I suggested Ampyra to her , which we than initiated but had to D/C after her kidney function changed.   The patient is diabetic, diagnosed in 2008. She just changed in 2014 to insulin.  She remains on Betaseron. Has  been gaining weight and has had a fall last December. Uses a Rollator when she is outdoors but indoors a walking stick or cane, but more and more frequently the walker. .   She believes her sleep is poor due to spasms and nocturia, she may snore. Still wakes with a dry mouth, has been told she snores. Sleeps prone. The patient underwent a sleep study I believe in 2014, but was unable to tolerate the CPAP due to her sleeping habit. She has a chief complaint today of cramping pain in the left leg, spasm, at the groin and right above the hip. No radiation , no burning and no deep ache - just spams. Onset 3 weeks ago.  Although, she has taken baclofen po now tid  for a long time , for the " MS hug" - it had an effect only on abdominal spasms. On the same dose, it has not affected the hip spasms.  She believes her sleep is poor due to spasms and nocturia, she may snore. Wakes with a dry mouth, has been told she snores. Sleeps prone.  The spasms in the left hip are preceded by sitting in certain low chairs in [employer] , her volunteer work place.  Dr. Sherril Croon  recently did some routine Labs, that are not available on EP[facility].     REVIEW OF SYSTEMS: Full 14 system review of systems performed and notable only for those listed, all others are neg:  Constitutional: neg  Cardiovascular: neg Ear/Nose/Throat: neg  Skin: neg Eyes: neg Respiratory: neg Gastroitestinal: neg  Hematology/Lymphatic: neg  Endocrine: neg Musculoskeletal: Walking difficulty Allergy/Immunology: neg Neurological: Mild cognitive impairment Psychiatric: neg Sleep : neg   ALLERGIES: Allergies  Allergen Reactions  . Penicillins Anaphylaxis    Has patient had a PCN reaction causing immediate rash, facial/tongue/throat swelling, SOB or lightheadedness with hypotension: Yes Has patient had a PCN reaction causing severe rash involving mucus membranes or skin necrosis: No Has patient had a PCN reaction that required hospitalization  No Has patient had a PCN reaction occurring within the last 10 years: No If all of the above answers are "NO", then may proceed with Cephalosporin use.   Marland Kitchen Lotensin [Benazepril Hcl] Cough    HOME MEDICATIONS: Outpatient Medications Prior to Visit  Medication Sig Dispense Refill  . aspirin 81 MG tablet Take 81 mg by mouth daily.    Marland Kitchen atorvastatin (LIPITOR) 40 MG tablet Take 40 mg by mouth daily.     . baclofen (LIORESAL) 10 MG tablet Take 1 tablet (10 mg total) 3 (three) times daily by mouth. (Patient taking differently: Take 10 mg by mouth 3 (three) times daily. Take 10 mg in am and 20mg  in pm) 270 tablet 3  . Calcium Carbonate (CALCIUM 600 PO) Take 1 tablet by mouth daily.    Marland Kitchen donepezil (ARICEPT) 5 MG tablet Take 1 tablet (5 mg total) by mouth at bedtime. 90 tablet 3  .  Dulaglutide 0.75 MG/0.5ML SOPN Inject 0.75 mLs into the skin once a week. 6 mL 0  . Ergocalciferol 2000 units CAPS Take 2,000 capsules by mouth daily.     Marland Kitchen glucose blood (ONE TOUCH ULTRA TEST) test strip Use as instructed 4 x daily. E11.65 150 each 5  . imipramine (TOFRANIL) 25 MG tablet TAKE 1 TABLET AT BEDTIME 30 tablet 2  . Insulin Glargine (LANTUS SOLOSTAR) 100 UNIT/ML Solostar Pen Inject 60 Units into the skin daily with breakfast. 10 pen 2  . Insulin Pen Needle 31G X 8 MM MISC 1 each by Does not apply route 2 (two) times daily. 200 each 2  . levothyroxine (SYNTHROID, LEVOTHROID) 25 MCG tablet Take 75 mcg daily before breakfast by mouth.    . metFORMIN (GLUCOPHAGE-XR) 500 MG 24 hr tablet Take 1 tablet (500 mg total) by mouth 2 (two) times daily. 180 tablet 0  . methenamine (HIPREX) 1 g tablet 2 (two) times daily with a meal.     . RELION PEN NEEDLES 32G X 4 MM MISC     . Tamsulosin HCl (FLOMAX) 0.4 MG CAPS Take 0.4 mg by mouth daily after breakfast.     . tolterodine (DETROL LA) 4 MG 24 hr capsule TAKE 1 CAPSULE DAILY 90 capsule 3  . TOPROL XL 25 MG 24 hr tablet Take 25 mg by mouth daily.      No  facility-administered medications prior to visit.     PAST MEDICAL HISTORY: Past Medical History:  Diagnosis Date  . Diabetes mellitus without complication (HCC)   . High cholesterol   . Hypertension   . Hypothyroidism   . Morbid obesity (HCC)   . MS (multiple sclerosis) (HCC) relapsing remitting  . Severe obesity (BMI >= 40) (HCC) 10/11/2012    PAST SURGICAL HISTORY: Past Surgical History:  Procedure Laterality Date  . BIOPSY  03/19/2015   Procedure: BIOPSY;  Surgeon: West Bali, MD;  Location: [facility];  Service: Endoscopy;;  random colon biopsy  . CHOLECYSTECTOMY  8/09  . COLONOSCOPY     about 7 years  . COLONOSCOPY WITH PROPOFOL N/A 03/19/2015   PVX:YIAXKP ileum and colon/small internal hemorrhoids  . LEFT OOPHORECTOMY Left 8/11    FAMILY HISTORY: Family History  Problem Relation Age of Onset  . Heart attack Mother   . Diabetes Mother   . Aneurysm Father   . Heart disease Father   . Cancer Maternal Grandmother        breast  . Colon cancer Neg Hx     SOCIAL HISTORY: Social History   Socioeconomic History  . Marital status: Single    Spouse name: Not on file  . Number of children: 0  . Years of education: college  . Highest education level: Not on file  Occupational History  . Occupation: disabled    Associate Professor: NOT EMPLOYED  Social Needs  . Financial resource strain: Not on file  . Food insecurity:    Worry: Not on file    Inability: Not on file  . Transportation needs:    Medical: Not on file    Non-medical: Not on file  Tobacco Use  . Smoking status: Never Smoker  . Smokeless tobacco: Never Used  Substance and Sexual Activity  . Alcohol use: No    Alcohol/week: 0.0 oz    Comment: quit in 1975  . Drug use: No  . Sexual activity: Never    Birth control/protection: Post-menopausal  Lifestyle  . Physical activity:  Days per week: Not on file    Minutes per session: Not on file  . Stress: Not on file  Relationships  . Social  connections:    Talks on phone: Not on file    Gets together: Not on file    Attends religious service: Not on file    Active member of club or organization: Not on file    Attends meetings of clubs or organizations: Not on file    Relationship status: Not on file  . Intimate partner violence:    Fear of current or ex partner: Not on file    Emotionally abused: Not on file    Physically abused: Not on file    Forced sexual activity: Not on file  Other Topics Concern  . Not on file  Social History Narrative   Patient is single and lives alone.   Patient is right-handed.   Patient has a college education.   Patient does not drink any caffeine.   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     PHYSICAL EXAM  Vitals:   06/17/17 1052  BP: 139/79  Pulse: 77  Weight: 238 lb 3.2 oz (108 kg)  Height: 5\' 4"  (1.626 m)   Body mass index is 40.89 kg/m.  Generalized: Well developed, obese female in no acute distress , well-groomed Head: normocephalic and atraumatic,. Oropharynx benign  Neck: Supple, no carotid bruits  Cardiac: Regular rate rhythm, no murmur  Musculoskeletal: No deformity   Neurological examination   Mentation: Alert AFT 10 Clock drawing 4/4 MMSE - Mini Mental State Exam 06/17/2017  Orientation to time 4  Orientation to Place 5  Registration 3  Attention/ Calculation 5  Recall 3  Language- name 2 objects 2  Language- repeat 1  Language- follow 3 step command 3  Language- read & follow direction 1  Write a sentence 0  Write a sentence-comments sentence didn't have subject  Copy design 1  Total score 28   Follows all commands speech and language fluent.   Cranial nerve II-XII: Fundoscopic exam reveals sharp disc margins.Pupils were equal round reactive to light extraocular movements were full, visual field were full on confrontational test. Facial sensation and strength were normal. hearing was intact to finger rubbing bilaterally. Uvula tongue  midline. head turning and shoulder shrug were normal and symmetric.Tongue protrusion into cheek strength was normal. Motor: normal bulk and tone, full strength in the BUE, BLE, fine finger movements normal, no pronator drift. No focal weakness Sensory: normal and symmetric to light touch, pinprick, and  Vibration, in the upper and lower extremities Coordination: finger-nose-finger, with  dysmetria Reflexes: Brisk upper and lower extremities plantar responses were flexor bilaterally. Gait and Station: Rising up from seated position without assistance, wide based  stance,  moderate stride, ambulates with rolling walker, she occasionally drags her left leg . Tandem gait not attempted.   DIAGNOSTIC DATA (LABS, IMAGING, TESTING) - I reviewed patient records, labs, notes, testing and imaging myself where available.  Lab Results  Component Value Date   WBC 6.6 11/09/2016   HGB 11.8 11/09/2016   HCT 35.8 11/09/2016   MCV 81 11/09/2016   PLT 367 11/09/2016      Component Value Date/Time   NA 141 06/11/2017 1057   NA 141 11/09/2016 1438   K 4.3 06/11/2017 1057   CL 105 06/11/2017 1057   CO2 28 06/11/2017 1057   GLUCOSE 126 06/11/2017 1057   BUN 36 (H) 06/11/2017 1057  BUN 22 11/09/2016 1438   CREATININE 0.94 06/11/2017 1057   CALCIUM 8.9 06/11/2017 1057   PROT 6.9 06/11/2017 1057   PROT 7.2 11/09/2016 1438   ALBUMIN 3.8 11/09/2016 1438   AST 13 06/11/2017 1057   ALT 14 06/11/2017 1057   ALKPHOS 115 11/09/2016 1438   BILITOT 0.3 06/11/2017 1057   BILITOT 0.2 11/09/2016 1438   GFRNONAA 66 06/11/2017 1057   GFRAA 77 06/11/2017 1057   Lab Results  Component Value Date   CHOL 148 03/17/2016   HDL 55 03/17/2016   LDLCALC 70 03/17/2016   TRIG 117 03/17/2016   CHOLHDL 2.7 03/17/2016   Lab Results  Component Value Date   HGBA1C 6.9 (H) 06/11/2017   No results found for: VITAMINB12 Lab Results  Component Value Date   TSH 2.36 03/01/2017      ASSESSMENT AND PLAN  62 y.o.  year old female  has a past medical history of Diabetes mellitus without complication (HCC), High cholesterol, Hypertension, Hypothyroidism,  MS (multiple sclerosis) (HCC) (relapsing remitting), and Severe obesity (BMI >= 40) (HCC) (10/11/2012).  And  mild cognitive impairment here to follow-up for her multiple sclerosis cognitive impairment has improved on Aricept.She is now on Ocrevus.    Memory score is stable 28/30 Continue Aricept daily for memory Next Ocrevus infusion appt July 22nd  F/U in 6 months Nilda Riggs, [facility], [facility], APRN  [facility] [address], [address] [place], [place] [address] [phone]

## 2017-06-17 ENCOUNTER — Ambulatory Visit (INDEPENDENT_AMBULATORY_CARE_PROVIDER_SITE_OTHER): Payer: Medicare Other | Admitting: Nurse Practitioner

## 2017-06-17 ENCOUNTER — Encounter: Payer: Self-pay | Admitting: Nurse Practitioner

## 2017-06-17 VITALS — BP 139/79 | HR 77 | Ht 64.0 in | Wt 238.2 lb

## 2017-06-17 DIAGNOSIS — G35 Multiple sclerosis: Secondary | ICD-10-CM

## 2017-06-17 DIAGNOSIS — G3184 Mild cognitive impairment, so stated: Secondary | ICD-10-CM | POA: Diagnosis not present

## 2017-06-17 DIAGNOSIS — R269 Unspecified abnormalities of gait and mobility: Secondary | ICD-10-CM | POA: Diagnosis not present

## 2017-06-17 NOTE — Patient Instructions (Signed)
Memory score is stable Continue Aricept daily for memory Next Ocrevus appt July 22nd  F/U in 6 months

## 2017-06-18 NOTE — Progress Notes (Signed)
I agree with the assessment and plan as directed by NP .The patient is known to me .   Emmabelle Fear, MD  

## 2017-06-24 ENCOUNTER — Other Ambulatory Visit: Payer: Self-pay | Admitting: "Endocrinology

## 2017-06-25 NOTE — Progress Notes (Signed)
I agree with the assessment for MS and MCI , and the treatment plan as directed by NP .The patient is known to me .   Kallista Pae, MD

## 2017-07-10 ENCOUNTER — Other Ambulatory Visit: Payer: Self-pay | Admitting: Neurology

## 2017-08-02 ENCOUNTER — Other Ambulatory Visit: Payer: Self-pay | Admitting: "Endocrinology

## 2017-08-16 DIAGNOSIS — G35 Multiple sclerosis: Secondary | ICD-10-CM | POA: Diagnosis not present

## 2017-08-25 ENCOUNTER — Ambulatory Visit (INDEPENDENT_AMBULATORY_CARE_PROVIDER_SITE_OTHER): Payer: Medicare Other | Admitting: Urology

## 2017-08-25 DIAGNOSIS — R339 Retention of urine, unspecified: Secondary | ICD-10-CM

## 2017-08-25 DIAGNOSIS — N3020 Other chronic cystitis without hematuria: Secondary | ICD-10-CM | POA: Diagnosis not present

## 2017-08-26 ENCOUNTER — Other Ambulatory Visit: Payer: Self-pay | Admitting: Neurology

## 2017-09-03 ENCOUNTER — Encounter: Payer: Self-pay | Admitting: "Endocrinology

## 2017-09-11 ENCOUNTER — Other Ambulatory Visit: Payer: Self-pay | Admitting: "Endocrinology

## 2017-09-20 ENCOUNTER — Ambulatory Visit: Payer: Medicare Other | Admitting: "Endocrinology

## 2017-09-20 DIAGNOSIS — Z6841 Body Mass Index (BMI) 40.0 and over, adult: Secondary | ICD-10-CM | POA: Diagnosis not present

## 2017-09-20 DIAGNOSIS — I10 Essential (primary) hypertension: Secondary | ICD-10-CM | POA: Diagnosis not present

## 2017-09-20 DIAGNOSIS — Z1211 Encounter for screening for malignant neoplasm of colon: Secondary | ICD-10-CM | POA: Diagnosis not present

## 2017-09-20 DIAGNOSIS — Z1331 Encounter for screening for depression: Secondary | ICD-10-CM | POA: Diagnosis not present

## 2017-09-20 DIAGNOSIS — Z0000 Encounter for general adult medical examination without abnormal findings: Secondary | ICD-10-CM | POA: Diagnosis not present

## 2017-09-20 DIAGNOSIS — Z79899 Other long term (current) drug therapy: Secondary | ICD-10-CM | POA: Diagnosis not present

## 2017-09-20 DIAGNOSIS — Z1339 Encounter for screening examination for other mental health and behavioral disorders: Secondary | ICD-10-CM | POA: Diagnosis not present

## 2017-09-20 DIAGNOSIS — E1165 Type 2 diabetes mellitus with hyperglycemia: Secondary | ICD-10-CM | POA: Diagnosis not present

## 2017-09-20 DIAGNOSIS — Z299 Encounter for prophylactic measures, unspecified: Secondary | ICD-10-CM | POA: Diagnosis not present

## 2017-09-20 DIAGNOSIS — E039 Hypothyroidism, unspecified: Secondary | ICD-10-CM | POA: Diagnosis not present

## 2017-09-20 DIAGNOSIS — Z7189 Other specified counseling: Secondary | ICD-10-CM | POA: Diagnosis not present

## 2017-09-20 DIAGNOSIS — R5383 Other fatigue: Secondary | ICD-10-CM | POA: Diagnosis not present

## 2017-09-20 DIAGNOSIS — E7800 Pure hypercholesterolemia, unspecified: Secondary | ICD-10-CM | POA: Diagnosis not present

## 2017-09-20 LAB — TSH: TSH: 3.07 (ref 0.41–5.90)

## 2017-09-20 LAB — LIPID PANEL
Cholesterol: 171 (ref 0–200)
HDL: 57 (ref 35–70)
LDL CALC: 91
Triglycerides: 115 (ref 40–160)

## 2017-09-20 LAB — BASIC METABOLIC PANEL
BUN: 32 — AB (ref 4–21)
Creatinine: 1 (ref 0.5–1.1)

## 2017-09-20 LAB — HEMOGLOBIN A1C: HEMOGLOBIN A1C: 6.7 — AB (ref 4.0–6.0)

## 2017-09-22 ENCOUNTER — Ambulatory Visit (HOSPITAL_COMMUNITY): Payer: Medicare Other

## 2017-09-22 ENCOUNTER — Other Ambulatory Visit (HOSPITAL_COMMUNITY): Payer: Self-pay | Admitting: Internal Medicine

## 2017-09-22 DIAGNOSIS — Z1231 Encounter for screening mammogram for malignant neoplasm of breast: Secondary | ICD-10-CM

## 2017-09-29 ENCOUNTER — Ambulatory Visit (HOSPITAL_COMMUNITY)
Admission: RE | Admit: 2017-09-29 | Discharge: 2017-09-29 | Disposition: A | Discharge status: Home / Self Care | Payer: Medicare Other | Source: Ambulatory Visit | Attending: Internal Medicine | Admitting: Internal Medicine

## 2017-09-29 DIAGNOSIS — Z1231 Encounter for screening mammogram for malignant neoplasm of breast: Secondary | ICD-10-CM | POA: Insufficient documentation

## 2017-10-01 DIAGNOSIS — I10 Essential (primary) hypertension: Secondary | ICD-10-CM | POA: Diagnosis not present

## 2017-10-01 DIAGNOSIS — E7800 Pure hypercholesterolemia, unspecified: Secondary | ICD-10-CM | POA: Diagnosis not present

## 2017-10-01 DIAGNOSIS — E119 Type 2 diabetes mellitus without complications: Secondary | ICD-10-CM | POA: Diagnosis not present

## 2017-10-04 ENCOUNTER — Telehealth: Payer: Self-pay | Admitting: "Endocrinology

## 2017-10-04 MED ORDER — METFORMIN HCL ER 500 MG PO TB24: 500.0000 mg | ORAL_TABLET | Freq: Two times a day (BID) | ORAL | 0 refills | Status: DC

## 2017-10-04 NOTE — Telephone Encounter (Signed)
[name] is in need of a refill on metFORMIN (GLUCOPHAGE-XR) 500 MG 24 hr tablet  Sent to [facility], please advise?

## 2017-10-18 ENCOUNTER — Ambulatory Visit: Payer: Medicare Other | Admitting: "Endocrinology

## 2017-10-24 IMAGING — MG 2D DIGITAL SCREENING BILATERAL MAMMOGRAM WITH CAD AND ADJUNCT TO
8 series · 8 of 24 positions shown · non-contrast
Comparison: Previous exam(s).

CLINICAL DATA: Screening.

EXAM:
2D DIGITAL SCREENING BILATERAL MAMMOGRAM WITH CAD AND ADJUNCT TOMO

[L CC]
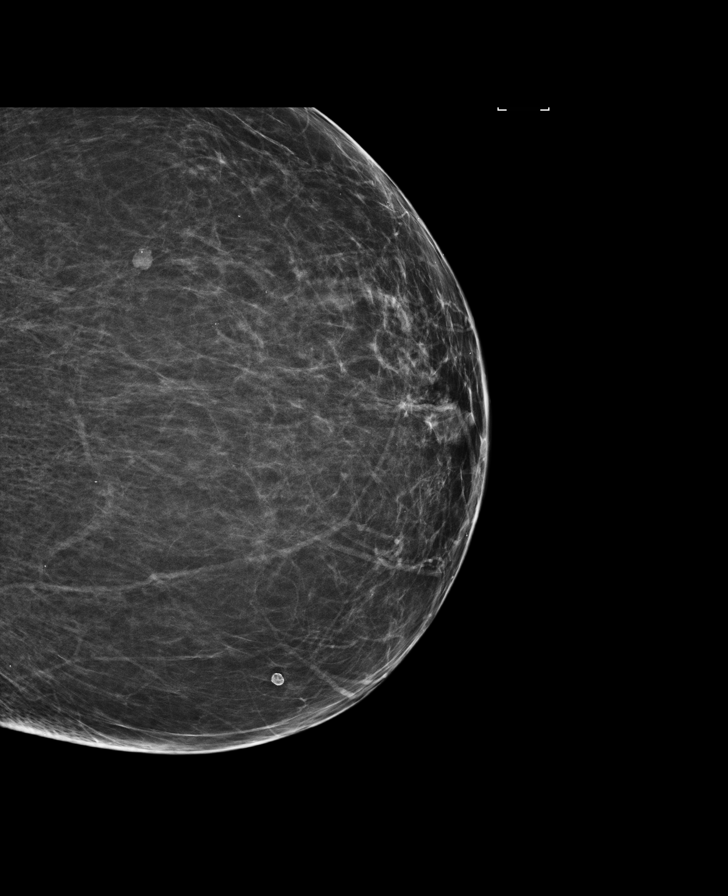

[L MLO]
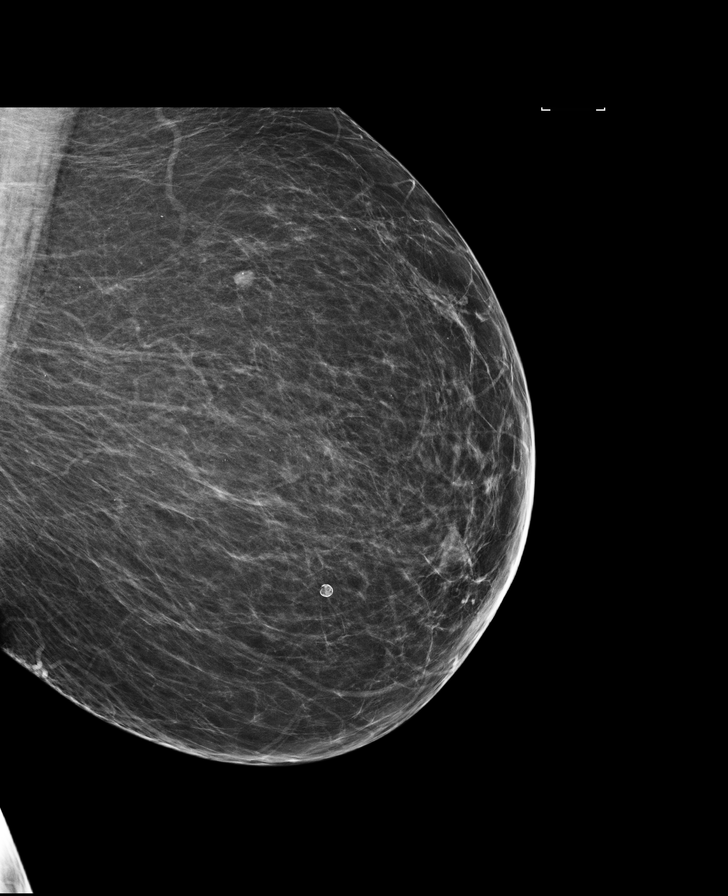

[R CC]
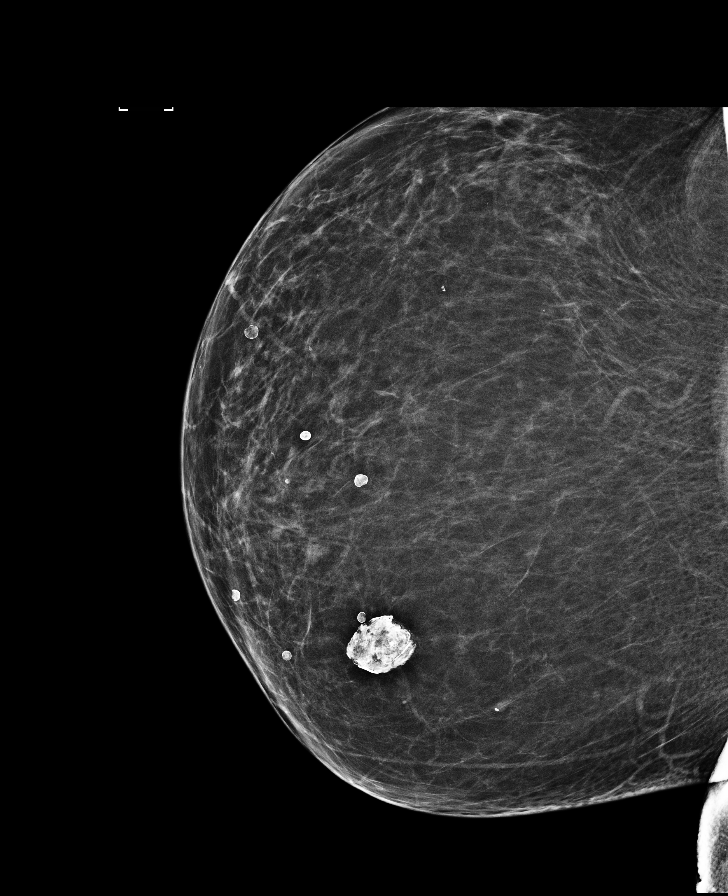

[R MLO]
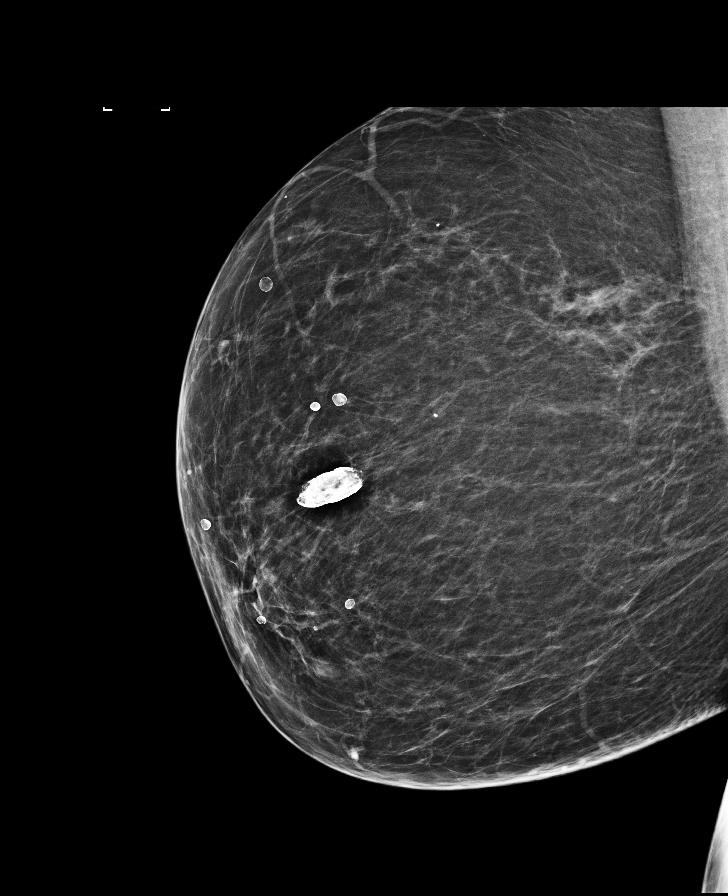

[R MLO tomo · tomo slice 35/70.0]
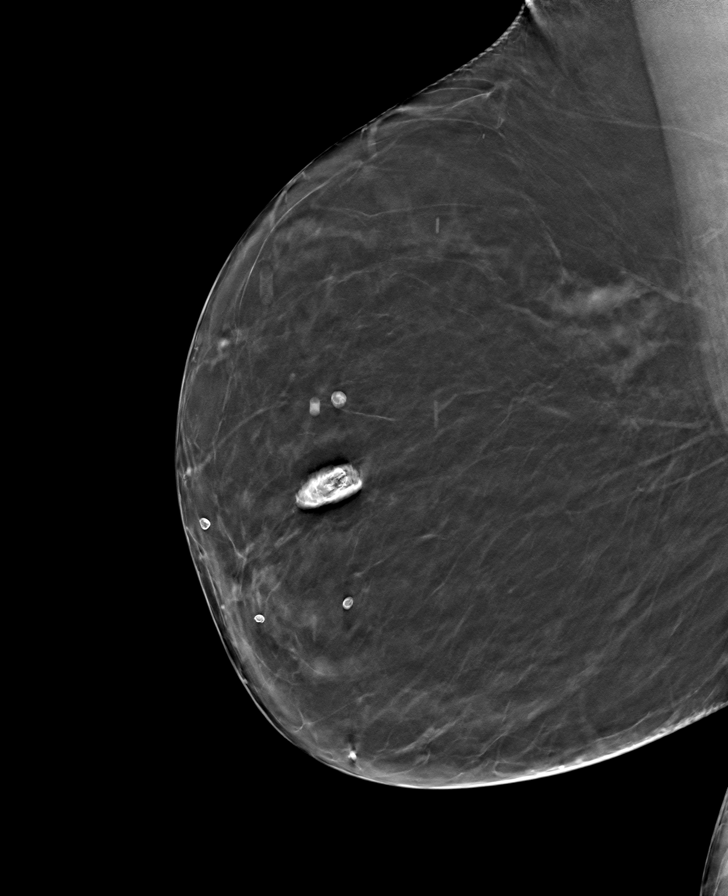

[L CC tomo · tomo slice 33/66.0]
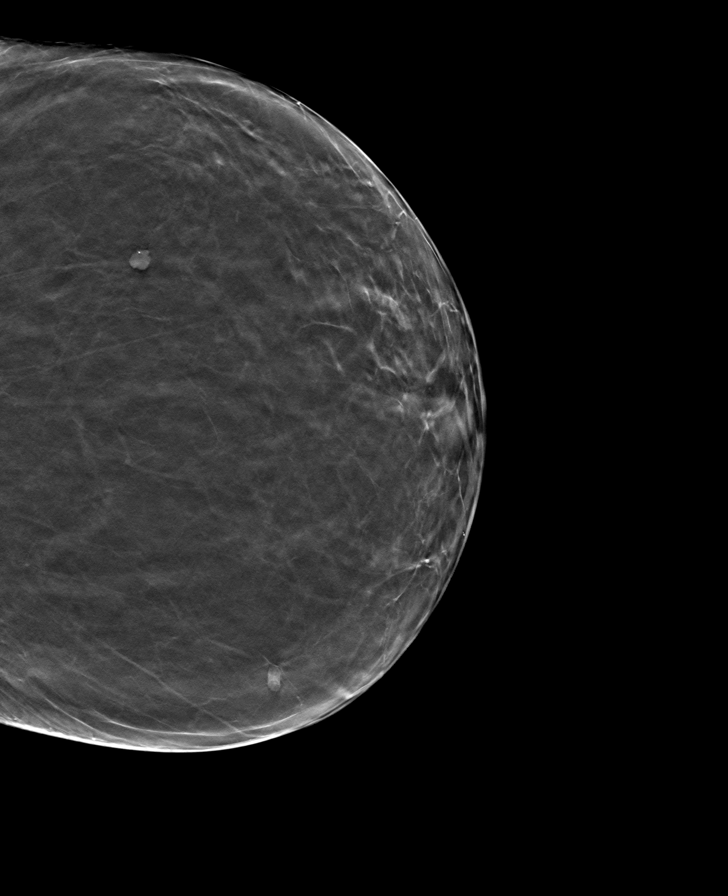

[L MLO tomo · tomo slice 37/72.0]
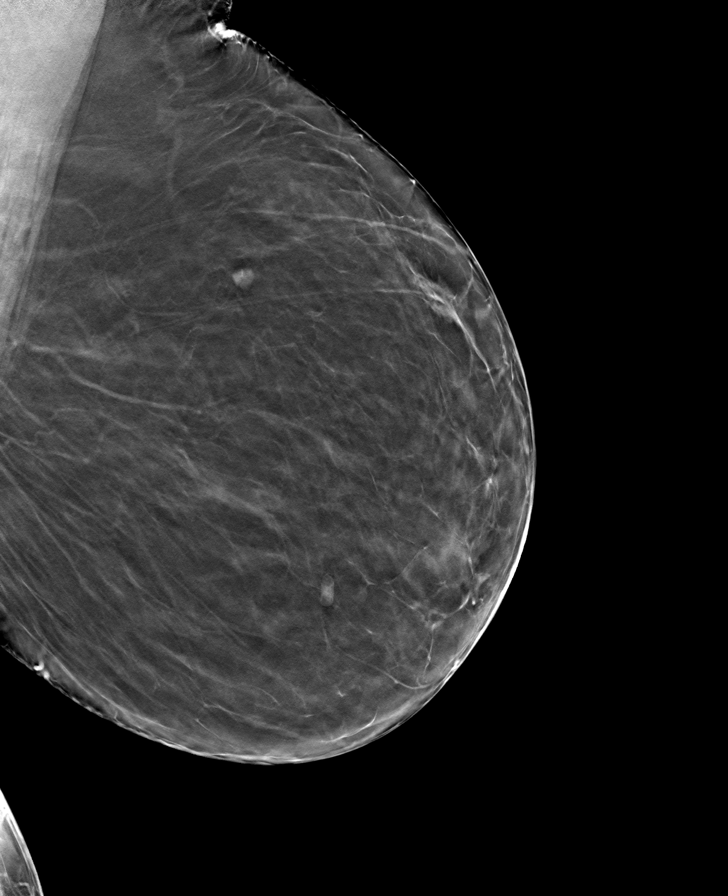

[R CC tomo · tomo slice 35/68.0]
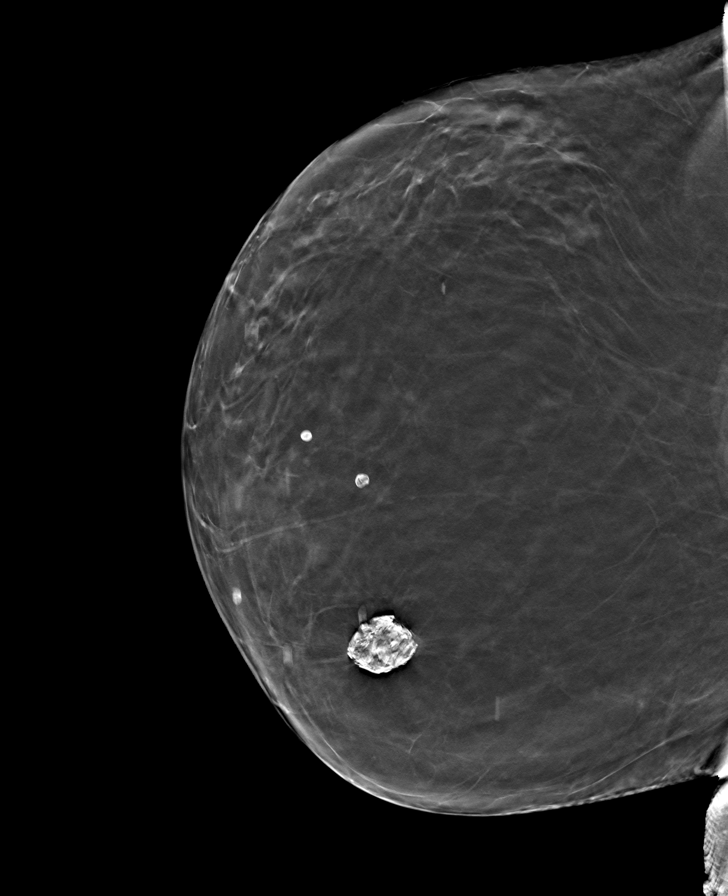

[8 of 24 positions shown; findings below may reference images not displayed]

ACR Breast Density Category b: There are scattered areas of
fibroglandular density.
FINDINGS: There are no findings suspicious for malignancy. Images were
processed with CAD.
IMPRESSION: No mammographic evidence of malignancy. A result letter of this
screening mammogram will be mailed directly to the patient.

RECOMMENDATION:
Screening mammogram in one year. (Code:97-6-RS4)

BI-RADS CATEGORY  1: Negative.

## 2017-10-27 ENCOUNTER — Ambulatory Visit (INDEPENDENT_AMBULATORY_CARE_PROVIDER_SITE_OTHER): Payer: Medicare Other | Admitting: "Endocrinology

## 2017-10-27 ENCOUNTER — Encounter: Payer: Self-pay | Admitting: "Endocrinology

## 2017-10-27 VITALS — BP 118/78 | HR 88 | Ht 64.0 in | Wt 233.0 lb

## 2017-10-27 DIAGNOSIS — E1122 Type 2 diabetes mellitus with diabetic chronic kidney disease: Secondary | ICD-10-CM

## 2017-10-27 DIAGNOSIS — E782 Mixed hyperlipidemia: Secondary | ICD-10-CM

## 2017-10-27 DIAGNOSIS — Z794 Long term (current) use of insulin: Secondary | ICD-10-CM | POA: Diagnosis not present

## 2017-10-27 DIAGNOSIS — N182 Chronic kidney disease, stage 2 (mild): Secondary | ICD-10-CM

## 2017-10-27 DIAGNOSIS — I10 Essential (primary) hypertension: Secondary | ICD-10-CM

## 2017-10-27 DIAGNOSIS — E039 Hypothyroidism, unspecified: Secondary | ICD-10-CM | POA: Diagnosis not present

## 2017-10-27 NOTE — Progress Notes (Signed)
Endocrinology follow-up note   Subjective:    Patient ID: [name], female    DOB: Aug 16, 1955,  [name], MD   Past Medical History:  Diagnosis Date  . Diabetes mellitus without complication (HCC)   . High cholesterol   . Hypertension   . Hypothyroidism   . Morbid obesity (HCC)   . MS (multiple sclerosis) (HCC) relapsing remitting  . Severe obesity (BMI >= 40) (HCC) 10/11/2012   Past Surgical History:  Procedure Laterality Date  . BIOPSY  03/19/2015   Procedure: BIOPSY;  Surgeon: West Bali, MD;  Location: [facility];  Service: Endoscopy;;  random colon biopsy  . CHOLECYSTECTOMY  8/09  . COLONOSCOPY     about 7 years  . COLONOSCOPY WITH PROPOFOL N/A 03/19/2015   WUJ:[id number] ileum and colon/small internal hemorrhoids  . LEFT OOPHORECTOMY Left 8/11   Social History   Socioeconomic History  . Marital status: Single    Spouse name: Not on file  . Number of children: 0  . Years of education: college  . Highest education level: Not on file  Occupational History  . Occupation: disabled    Associate Professor: NOT EMPLOYED  Social Needs  . Financial resource strain: Not on file  . Food insecurity:    Worry: Not on file    Inability: Not on file  . Transportation needs:    Medical: Not on file    Non-medical: Not on file  Tobacco Use  . Smoking status: Never Smoker  . Smokeless tobacco: Never Used  Substance and Sexual Activity  . Alcohol use: No    Alcohol/week: 0.0 standard drinks    Comment: quit in 1975  . Drug use: No  . Sexual activity: Never    Birth control/protection: Post-menopausal  Lifestyle  . Physical activity:    Days per week: Not on file    Minutes per session: Not on file  . Stress: Not on file  Relationships  . Social connections:    Talks on phone: Not on file    Gets together: Not on file    Attends religious service: Not on file    Active member of club or organization: Not on file    Attends meetings of clubs or organizations:  Not on file    Relationship status: Not on file  Other Topics Concern  . Not on file  Social History Narrative   Patient is single and lives alone.   Patient is right-handed.   Patient has a college education.   Patient does not drink any caffeine.   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   Outpatient Encounter Medications as of 10/27/2017  Medication Sig  . aspirin 81 MG tablet Take 81 mg by mouth daily.  Marland Kitchen atorvastatin (LIPITOR) 40 MG tablet Take 40 mg by mouth daily.   . baclofen (LIORESAL) 10 MG tablet Take 1 tablet (10 mg total) 3 (three) times daily by mouth. (Patient taking differently: Take 10 mg by mouth 3 (three) times daily. Take 10 mg in am and 20mg  in pm)  . Calcium Carbonate (CALCIUM 600 PO) Take 1 tablet by mouth daily.  Marland Kitchen donepezil (ARICEPT) 5 MG tablet Take 1 tablet (5 mg total) by mouth at bedtime.  . Ergocalciferol 2000 units CAPS Take 2,000 capsules by mouth daily.   Marland Kitchen glucose blood (ONE TOUCH ULTRA TEST) test strip Use as instructed 4 x daily. E11.65  . imipramine (TOFRANIL) 25 MG tablet TAKE 1 TABLET  AT BEDTIME  . Insulin Glargine (LANTUS SOLOSTAR) 100 UNIT/ML Solostar Pen Inject 60 Units into the skin daily with breakfast.  . Insulin Pen Needle 31G X 8 MM MISC 1 each by Does not apply route 2 (two) times daily.  Marland Kitchen levothyroxine (SYNTHROID, LEVOTHROID) 25 MCG tablet Take 75 mcg daily before breakfast by mouth.  . metFORMIN (GLUCOPHAGE-XR) 500 MG 24 hr tablet Take 1 tablet (500 mg total) by mouth 2 (two) times daily.  . methenamine (HIPREX) 1 g tablet 2 (two) times daily with a meal.   . RELION PEN NEEDLES 32G X 4 MM MISC   . Tamsulosin HCl (FLOMAX) 0.4 MG CAPS Take 0.4 mg by mouth daily after breakfast.   . tolterodine (DETROL LA) 4 MG 24 hr capsule TAKE 1 CAPSULE DAILY  . TOPROL XL 25 MG 24 hr tablet Take 25 mg by mouth daily.   . TRULICITY 0.75 MG/0.5ML SOPN INJECT 0.75 MG UNDER THE SKIN ONCE A WEEK   No facility-administered encounter medications  on file as of 10/27/2017.    ALLERGIES: Allergies  Allergen Reactions  . Penicillins Anaphylaxis    Has patient had a PCN reaction causing immediate rash, facial/tongue/throat swelling, SOB or lightheadedness with hypotension: Yes Has patient had a PCN reaction causing severe rash involving mucus membranes or skin necrosis: No Has patient had a PCN reaction that required hospitalization No Has patient had a PCN reaction occurring within the last 10 years: No If all of the above answers are "NO", then may proceed with Cephalosporin use.   Marland Kitchen Lotensin [Benazepril Hcl] Cough   VACCINATION STATUS:  There is no immunization history on file for this patient.  Diabetes  She presents for her follow-up diabetic visit. She has type 2 diabetes mellitus. Onset time: She was diagnosed at approximate age of 52 years. Her disease course has been stable. There are no hypoglycemic associated symptoms. Pertinent negatives for hypoglycemia include no confusion, headaches, pallor or seizures. Associated symptoms include fatigue. Pertinent negatives for diabetes include no chest pain, no polydipsia, no polyphagia and no polyuria. There are no hypoglycemic complications. Symptoms are stable. Diabetic complications include nephropathy and peripheral neuropathy. Risk factors for coronary artery disease include diabetes mellitus, dyslipidemia, hypertension and sedentary lifestyle. Current diabetic treatment includes insulin injections and oral agent (monotherapy). She is compliant with treatment most of the time. Her weight is decreasing steadily. She is following a generally unhealthy diet. She has had a previous visit with a dietitian. She never participates in exercise. Her home blood glucose trend is decreasing steadily. Her breakfast blood glucose range is generally 130-140 mg/dl. Her bedtime blood glucose range is generally 140-180 mg/dl. Her overall blood glucose range is 130-140 mg/dl. An ACE inhibitor/angiotensin  II receptor blocker is being taken.  Thyroid Problem  Presents for follow-up visit. Symptoms include fatigue. Patient reports no cold intolerance, diarrhea, heat intolerance or palpitations. The symptoms have been improving. Past treatments include levothyroxine. Her past medical history is significant for diabetes and hyperlipidemia.  Hyperlipidemia  This is a chronic problem. The current episode started more than 1 year ago. Exacerbating diseases include diabetes, hypothyroidism and obesity. Pertinent negatives include no chest pain, myalgias or shortness of breath. Current antihyperlipidemic treatment includes statins. Risk factors for coronary artery disease include diabetes mellitus, dyslipidemia, hypertension, obesity, a sedentary lifestyle and post-menopausal.  Hypertension  This is a chronic problem. The current episode started more than 1 year ago. Pertinent negatives include no chest pain, headaches, palpitations or shortness of breath. Risk  factors for coronary artery disease include dyslipidemia, diabetes mellitus, obesity, sedentary lifestyle, family history and post-menopausal state. Past treatments include angiotensin blockers. Identifiable causes of hypertension include a thyroid problem.    Review of Systems  Constitutional: Positive for fatigue. Negative for unexpected weight change.  HENT: Negative for trouble swallowing and voice change.   Eyes: Negative for visual disturbance.  Respiratory: Negative for cough, shortness of breath and wheezing.   Cardiovascular: Negative for chest pain, palpitations and leg swelling.  Gastrointestinal: Negative for diarrhea, nausea and vomiting.  Endocrine: Negative for cold intolerance, heat intolerance, polydipsia, polyphagia and polyuria.  Musculoskeletal: Negative for arthralgias and myalgias.  Skin: Negative for color change, pallor, rash and wound.  Neurological: Negative for seizures and headaches.  Psychiatric/Behavioral: Negative for  confusion and suicidal ideas.    Objective:    BP 118/78   Pulse 88   Ht 5\' 4"  (1.626 m)   Wt 233 lb (105.7 kg)   BMI 39.99 kg/m   Wt Readings from Last 3 Encounters:  10/27/17 233 lb (105.7 kg)  06/17/17 238 lb 3.2 oz (108 kg)  06/14/17 237 lb (107.5 kg)    Physical Exam  Constitutional: She is oriented to person, place, and time. She appears well-developed.  HENT:  Head: Normocephalic and atraumatic.  Eyes: EOM are normal.  Neck: Normal range of motion. Neck supple. No tracheal deviation present. No thyromegaly present.  Cardiovascular: Normal rate.  Pulmonary/Chest: Effort normal.  Abdominal: There is no tenderness. There is no guarding.  Musculoskeletal: She exhibits no edema.  She walks with her walker due to multiple sclerosis.  Neurological: She is alert and oriented to person, place, and time. She has normal reflexes. No cranial nerve deficit. Coordination normal.  Skin: Skin is warm and dry. No rash noted. No erythema. No pallor.  Psychiatric: She has a normal mood and affect. Judgment normal.    Results for orders placed or performed in visit on 10/27/17  Basic metabolic panel  Result Value Ref Range   BUN 32 (A) 4 - 21   Creatinine 1.0 0.5 - 1.1  Lipid panel  Result Value Ref Range   Triglycerides 115 40 - 160   Cholesterol 171 0 - 200   HDL 57 35 - 70   LDL Cholesterol 91   Hemoglobin A1c  Result Value Ref Range   Hgb A1c MFr Bld 6.7 (A) 4.0 - 6.0  TSH  Result Value Ref Range   TSH 3.07 0.41 - 5.90   Complete Blood Count (Most recent): Lab Results  Component Value Date   WBC 6.6 11/09/2016   HGB 11.8 11/09/2016   HCT 35.8 11/09/2016   MCV 81 11/09/2016   PLT 367 11/09/2016   Chemistry (most recent): Lab Results  Component Value Date   NA 141 06/11/2017   K 4.3 06/11/2017   CL 105 06/11/2017   CO2 28 06/11/2017   BUN 32 (A) 09/20/2017   CREATININE 1.0 09/20/2017   Diabetic Labs (most recent): Lab Results  Component Value Date   HGBA1C  6.7 (A) 09/20/2017   HGBA1C 6.9 (H) 06/11/2017   HGBA1C 10.9 (H) 03/01/2017   Lipid Panel     Component Value Date/Time   CHOL 171 09/20/2017   TRIG 115 09/20/2017   HDL 57 09/20/2017   CHOLHDL 2.7 03/17/2016 1228   VLDL 23 03/17/2016 1228   LDLCALC 91 09/20/2017    Assessment & Plan:   1. Type 2 diabetes mellitus with stage 2 chronic kidney disease,  with long-term current use of insulin (HCC)  -Her CKD has reversed,  patient remains at a high risk for more acute and chronic complications of diabetes which include CAD, CVA, CKD, retinopathy, and neuropathy. These are all discussed in detail with the patient. - Lately,  she is getting a big help from her sisters , Elita Quick and [name].  - They make sure that N[place]jah gets her medications especially the morning injections of insulin.  Her A1c has improved to 6.7% from 10.9%.    - I have re-counseled the patient on diet management and weight loss  by adopting a carbohydrate restricted / protein rich  Diet.  -  Suggestion is made for her to avoid simple carbohydrates  from her diet including Cakes, Sweet Desserts / Pastries, Ice Cream, Soda (diet and regular), Sweet Tea, Candies, Chips, Cookies, Store Bought Juices, Alcohol in Excess of  1-2 drinks a day, Artificial Sweeteners, and "Sugar-free" Products. This will help patient to have stable blood glucose profile and potentially avoid unintended weight gain.  - Patient is advised to stick to a routine mealtimes to eat 3 meals  a day and avoid unnecessary snacks ( to snack only to correct hypoglycemia).  - I have approached patient with the following individualized plan to manage diabetes and patient agrees.  -Based on her presentation with controlled glycemic profile, she would benefit from simplified insulin treatment program, she will not need prandial insulin.  -I advised to continue Lantus at 60  units q. a.m. with breakfast,  associated with strict monitoring of blood glucose 2 times a  day-before breakfast  and at bedtime. -She  is encouraged to call clinic for blood glucose levels less than 70 or above 200 mg /dl. -For better insulin sensitivity I will continue metformin ER  at lower dose of 500mg  po twice a day.  -She is benefiting from the weekly injectable incretin therapy, I advised her to continue Trulicity 0.75 mg weekly.  -Target numbers for A1c, LDL, HDL, Triglycerides, Waist Circumference were discussed in detail.   - Patient specific target  for A1c; LDL, HDL, Triglycerides, and  Waist Circumference were discussed in detail.  2) BP/HTN: Her blood pressure is controlled to target.  She is advised to continue current medications including Toprol 25 mg p.o. daily .  3) Lipids/HPL: Her recent lipid panel showed uncontrolled LDL at 91.  She is advised to continue atorvastatin 40 mg p.o. nightly.   4)  Weight/Diet: CDE consult in progress, exercise, and carbohydrates information provided.  5)  Primary hypothyroidism -Her previsit thyroid function tests are consistent with appropriate replacement. -I advised her to continue levothyroxine 75 mcg p.o. every morning.    - We discussed about correct intake of levothyroxine, at fasting, with water, separated by at least 30 minutes from breakfast, and separated by more than 4 hours from calcium, iron, multivitamins, acid reflux medications (PPIs). -Patient is made aware of the fact that thyroid hormone replacement is needed for life, dose to be adjusted by periodic monitoring of thyroid function tests.    6) Chronic Care/Health Maintenance:  -Patient is on ACEI/ARB and Statin medications and encouraged to continue to follow up with Ophthalmology, Podiatrist at least yearly or according to recommendations, and advised to  stay away from smoking. I have recommended yearly flu vaccine and pneumonia vaccination at least every 5 years; and  sleep for at least 7 hours a day.  I advised patient to maintain close follow up with  her PCP for primary  care needs.  - Time spent with the patient: 25 min, of which >50% was spent in reviewing her blood glucose logs , discussing her hypo- and hyper-glycemic episodes, reviewing her current and  previous labs and insulin doses and developing a plan to avoid hypo- and hyper-glycemia. Please refer to Patient Instructions for Blood Glucose Monitoring and Insulin/Medications Dosing Guide"  in media tab for additional information. [name] participated in the discussions, expressed understanding, and voiced agreement with the above plans.  All questions were answered to her satisfaction. she is encouraged to contact clinic should she have any questions or concerns prior to her return visit.   Follow up plan: Return in about 4 months (around 02/27/2018) for Follow up with Pre-visit Labs, Meter, and Logs.  Marquis Lunch, MD Phone: [phone]  Fax: [phone]   10/27/2017, 5:36 PM

## 2017-10-27 NOTE — Patient Instructions (Signed)

## 2017-11-04 DIAGNOSIS — E7800 Pure hypercholesterolemia, unspecified: Secondary | ICD-10-CM | POA: Diagnosis not present

## 2017-11-04 DIAGNOSIS — E119 Type 2 diabetes mellitus without complications: Secondary | ICD-10-CM | POA: Diagnosis not present

## 2017-11-04 DIAGNOSIS — I10 Essential (primary) hypertension: Secondary | ICD-10-CM | POA: Diagnosis not present

## 2017-11-22 ENCOUNTER — Telehealth: Payer: Self-pay | Admitting: "Endocrinology

## 2017-11-22 NOTE — Telephone Encounter (Signed)
[name] is asking for a refill on One Touch Ultra Blue test strips #50 called to [facility]

## 2017-11-23 ENCOUNTER — Other Ambulatory Visit: Payer: Self-pay

## 2017-11-23 MED ORDER — GLUCOSE BLOOD VI STRP: ORAL_STRIP | 1 refills | Status: DC

## 2017-11-23 MED ORDER — GLUCOSE BLOOD VI STRP: ORAL_STRIP | 2 refills | Status: DC

## 2017-11-23 NOTE — Telephone Encounter (Signed)
One touch Ultra test strips sent to [facility] per pts sister [name] ) request.

## 2017-11-24 ENCOUNTER — Other Ambulatory Visit: Payer: Self-pay | Admitting: "Endocrinology

## 2017-11-27 ENCOUNTER — Other Ambulatory Visit: Payer: Self-pay | Admitting: Neurology

## 2017-12-04 ENCOUNTER — Other Ambulatory Visit: Payer: Self-pay | Admitting: Neurology

## 2017-12-05 DIAGNOSIS — Z23 Encounter for immunization: Secondary | ICD-10-CM | POA: Diagnosis not present

## 2017-12-13 DIAGNOSIS — I10 Essential (primary) hypertension: Secondary | ICD-10-CM | POA: Diagnosis not present

## 2017-12-13 DIAGNOSIS — E119 Type 2 diabetes mellitus without complications: Secondary | ICD-10-CM | POA: Diagnosis not present

## 2017-12-13 DIAGNOSIS — E7800 Pure hypercholesterolemia, unspecified: Secondary | ICD-10-CM | POA: Diagnosis not present

## 2018-01-12 NOTE — Progress Notes (Signed)
[facility]  PATIENT: [name] DOB: [birth year]   REASON FOR VISIT: Follow-up for mild cognitive impairment, multiple sclerosis HISTORY FROM: Patient and sister [name]    HISTORY OF PRESENT ILLNESS:UPDATE 12/19/2019CM [name], 62 year old female returns for follow-up with relapsing remitting multiple sclerosis and mild cognitive impairment.  She is currently on Aricept and she feels her memory is stable.  She is on Ocrevus IV every 6 months for her multiple sclerosis.  Her next infusion is February 15, 2018.  MRI of the brain 11/23/2016 with and without contrast shows the following: 1.    Multiple T2/FLAIR hyperintense foci in the periventricular, juxtacortical and deep white matter of the hemispheres and a couple foci in the infratentorial brain in a pattern and configuration consistent with chronic demyelinating plaque associated with multiple sclerosis. None of the foci appears to be acute. When compared to the MRI dated 09/15/2014, there is no definite interval change. 2.    There are no acute findings and there is a normal enhancement pattern She is ambulating with a rolling walker.  She has had one fall in 6 months no injury.  She returns for reevaluation with no new concerns.  UPDATE 5/3/2019CM [name], 62 year old female returns for follow-up with history of multiple sclerosis and mild cognitive impairment.  She was placed on Aricept by [name] and patient and sister has seen improvement.  MMSE today 29 out of 30 in addition she has been placed on Ocrevus IV infusion for her multiple sclerosis.  Her next infusion is July 22nd.  She continues to use her rolling walker for ambulation.  She has not had any falls.  She returns for reevaluation without any further neurologic complaints.   Interval history from 23 December 2016, I am seeing [name] here today long-established patient with MS and recent cognitive decline, in the presence of her sister  [name], [name].  In our last visit 6 weeks ago we had discussed to start endocrine fluids for the treatment of MS which helps her to be compliant. The first infusion is scheduled for December 27 th.  In preparation we had obtained a blood test for tuberculosis was returned negative,  negative hepatitis panel, comprehensive metabolic panel and CBC.  Her creatinine is a slight bit elevated, but her glomerular filtration rate was 59 -which is still considered normal.   She also had high blood sugars.  White and red blood cell counts were normal region.There was no anemia.  The patient's MRI from 23 November 2016 showed multiple T2 and FLAIR hyperintense foci in the pattern and configuration consistent with demyelinating plaques.  This is a chronic multiple sclerosis as none of the foci appear to be acute.  I consider the patient a good candidate for OCREVUS. I was pleasantly surprised to see the patient looking brighter, more alert and certainly overall healthy.today .  She has eaten more regularly and more fresh food, she also has more social interaction leaves the house more often.  Appears more alert.  She is able to walk with her  4 wheeled walker. Mrs [name] felt that Aricept has had a positive effect on her sister.  We will repeat a MOCA/ MMSE in 3 month , next visit with NP  11-09-2016 [name] is a 62 y.o. female  Is seen here as a revisit  from [name] for multiple sclerosis follow up.  I had the pleasure of seeing [name] today in the presence  of her sister, physician assistant [name]. I know [name] from the [facility] radiology department where she has worked with the interventional team. I last saw [name] in late 2017 when our goal was to change her to a medication of infusion character, namely OCREVUS. At the time she had an MRI with out contrast that confirmed that and as was her diagnosis, but could not 100% differentiate between acute and more remote lesions. Now, almost  a year later when it again as [name] had missed some of her appointments. From now on she will bring her sister with her. My goal is still to change the patient to a therapy that doesn't need to daily or every other day compliance neither pill no injection but rather an infusion therapy here in office. Low compliance would therefore not be as impairing. I would still like to change her to OCREVUS- but will have to repeat the hepatitis panel, the gold tuberculosis test and the patient information form.  She is not driving any longer but lives alone- close to her sister. CD   The patient had followed [name] for over 20 years , diagnosed since 1989 ,, and is now mainly seen by Margaret Angel, NP. She has never been on another medication, but Betaseron.  In my last visit , 2013 , I suggested Ampyra to her , which we than initiated but had to D/C after her kidney function changed.   The patient is diabetic, diagnosed in 2008. She just changed in 2014 to insulin.  She remains on Betaseron. Has been gaining weight and has had a fall last December. Uses a Rollator when she is outdoors but indoors a walking stick or cane, but more and more frequently the walker. .   She believes her sleep is poor due to spasms and nocturia, she may snore. Still wakes with a dry mouth, has been told she snores. Sleeps prone. The patient underwent a sleep study I believe in 2014, but was unable to tolerate the CPAP due to her sleeping habit. She has a chief complaint today of cramping pain in the left leg, spasm, at the groin and right above the hip. No radiation , no burning and no deep ache - just spams. Onset 3 weeks ago.  Although, she has taken baclofen po now tid  for a long time , for the " MS hug" - it had an effect only on abdominal spasms. On the same dose, it has not affected the hip spasms.  She believes her sleep is poor due to spasms and nocturia, she may snore. Wakes with a dry mouth, has been told she  snores. Sleeps prone.  The spasms in the left hip are preceded by sitting in certain low chairs in [employer] , her volunteer work place.  [name]  recently did some routine Labs, that are not available on EPIC.     REVIEW OF SYSTEMS: Full 14 system review of systems performed and notable only for those listed, all others are neg:  Constitutional: neg  Cardiovascular: neg Ear/Nose/Throat: neg  Skin: neg Eyes: neg Respiratory: neg Gastroitestinal: neg  Hematology/Lymphatic: neg  Endocrine: neg Musculoskeletal: Walking difficulty Allergy/Immunology: neg Neurological: Mild cognitive impairment Psychiatric: neg Sleep : neg   ALLERGIES: Allergies  Allergen Reactions  . Penicillins Anaphylaxis    Has patient had a PCN reaction causing immediate rash, facial/tongue/throat swelling, SOB or lightheadedness with hypotension: Yes Has patient had a PCN reaction causing severe rash involving mucus  membranes or skin necrosis: No Has patient had a PCN reaction that required hospitalization No Has patient had a PCN reaction occurring within the last 10 years: No If all of the above answers are "NO", then may proceed with Cephalosporin use.   Marland Kitchen Lotensin [Benazepril Hcl] Cough    HOME MEDICATIONS: Outpatient Medications Prior to Visit  Medication Sig Dispense Refill  . aspirin 81 MG tablet Take 81 mg by mouth daily.    Marland Kitchen atorvastatin (LIPITOR) 40 MG tablet Take 40 mg by mouth daily.     . baclofen (LIORESAL) 10 MG tablet Take 1 tablet (10 mg total) by mouth 3 (three) times daily. Take 10 mg in am and 20mg  in pm 270 tablet 1  . Calcium Carbonate (CALCIUM 600 PO) Take 1 tablet by mouth daily.    Marland Kitchen donepezil (ARICEPT) 5 MG tablet Take 1 tablet (5 mg total) by mouth at bedtime. 90 tablet 3  . Ergocalciferol 2000 units CAPS Take 2,000 capsules by mouth daily.     Marland Kitchen glucose blood (ONE TOUCH ULTRA TEST) test strip Use as instructed 4 x daily. E11.65 One touch Ultra 400 each 2  . glucose blood  (ONE TOUCH ULTRA TEST) test strip AS DIRECTED TO TEST BLOOD SUGAR FOUR TIMES DAILY 350 each 2  . imipramine (TOFRANIL) 25 MG tablet TAKE 1 TABLET AT BEDTIME 90 tablet 2  . Insulin Glargine (LANTUS SOLOSTAR) 100 UNIT/ML Solostar Pen Inject 60 Units into the skin daily with breakfast. 10 pen 2  . Insulin Pen Needle 31G X 8 MM MISC 1 each by Does not apply route 2 (two) times daily. 200 each 2  . levothyroxine (SYNTHROID, LEVOTHROID) 25 MCG tablet Take 75 mcg daily before breakfast by mouth.    . metFORMIN (GLUCOPHAGE-XR) 500 MG 24 hr tablet Take 1 tablet (500 mg total) by mouth 2 (two) times daily. 180 tablet 0  . methenamine (HIPREX) 1 g tablet 2 (two) times daily with a meal.     . RELION PEN NEEDLES 32G X 4 MM MISC     . Tamsulosin HCl (FLOMAX) 0.4 MG CAPS Take 0.4 mg by mouth daily after breakfast.     . tolterodine (DETROL LA) 4 MG 24 hr capsule TAKE 1 CAPSULE DAILY 90 capsule 3  . TOPROL XL 25 MG 24 hr tablet Take 25 mg by mouth daily.     . TRULICITY 0.75 MG/0.5ML SOPN INJECT 0.75 MG UNDER THE SKIN ONCE A WEEK 6 mL 1   No facility-administered medications prior to visit.     PAST MEDICAL HISTORY: Past Medical History:  Diagnosis Date  . Diabetes mellitus without complication (HCC)   . High cholesterol   . Hypertension   . Hypothyroidism   . Morbid obesity (HCC)   . MS (multiple sclerosis) (HCC) relapsing remitting  . Severe obesity (BMI >= 40) (HCC) 10/11/2012    PAST SURGICAL HISTORY: Past Surgical History:  Procedure Laterality Date  . BIOPSY  03/19/2015   Procedure: BIOPSY;  Surgeon: West Bali, MD;  Location: [facility];  Service: Endoscopy;;  random colon biopsy  . CHOLECYSTECTOMY  8/09  . COLONOSCOPY     about 7 years  . COLONOSCOPY WITH PROPOFOL N/A 03/19/2015   WUJ:WJXBJY ileum and colon/small internal hemorrhoids  . LEFT OOPHORECTOMY Left 8/11    FAMILY HISTORY: Family History  Problem Relation Age of Onset  . Heart attack Mother   . Diabetes Mother   .  Aneurysm Father   . Heart  disease Father   . Cancer Maternal Grandmother        breast  . Colon cancer Neg Hx     SOCIAL HISTORY: Social History   Socioeconomic History  . Marital status: Single    Spouse name: Not on file  . Number of children: 0  . Years of education: college  . Highest education level: Not on file  Occupational History  . Occupation: disabled    Associate Professor: NOT EMPLOYED  Social Needs  . Financial resource strain: Not on file  . Food insecurity:    Worry: Not on file    Inability: Not on file  . Transportation needs:    Medical: Not on file    Non-medical: Not on file  Tobacco Use  . Smoking status: Never Smoker  . Smokeless tobacco: Never Used  Substance and Sexual Activity  . Alcohol use: No    Alcohol/week: 0.0 standard drinks    Comment: quit in 1975  . Drug use: No  . Sexual activity: Never    Birth control/protection: Post-menopausal  Lifestyle  . Physical activity:    Days per week: Not on file    Minutes per session: Not on file  . Stress: Not on file  Relationships  . Social connections:    Talks on phone: Not on file    Gets together: Not on file    Attends religious service: Not on file    Active member of club or organization: Not on file    Attends meetings of clubs or organizations: Not on file    Relationship status: Not on file  . Intimate partner violence:    Fear of current or ex partner: Not on file    Emotionally abused: Not on file    Physically abused: Not on file    Forced sexual activity: Not on file  Other Topics Concern  . Not on file  Social History Narrative   Patient is single and lives alone.   Patient is right-handed.   Patient has a college education.   Patient does not drink any caffeine.   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     PHYSICAL EXAM  Vitals:   01/13/18 0900  BP: 131/75  Pulse: 86  Weight: 233 lb (105.7 kg)  Height: 5\' 4"  (1.626 m)   Body mass index is 39.99  kg/m.  Generalized: Well developed, obese female in no acute distress , well-groomed Head: normocephalic and atraumatic,. Oropharynx benign  Neck: Supple,  Musculoskeletal: No deformity   Neurological examination   Mentation: Alert AFT 11 Clock drawing 4/4 MMSE - Mini Mental State Exam 01/13/2018 06/17/2017  Orientation to time 4 4  Orientation to Place 5 5  Registration 3 3  Attention/ Calculation 5 5  Recall 3 3  Language- name 2 objects 2 2  Language- repeat 1 1  Language- follow 3 step command 3 3  Language- read & follow direction 1 1  Write a sentence 0 0  Write a sentence-comments no subject sentence didn't have subject  Copy design 1 1  Total score 28 28   Follows all commands speech and language fluent.   Cranial nerve II-XII: Fundoscopic exam reveals sharp disc margins.Pupils were equal round reactive to light extraocular movements were full, visual field were full on confrontational test. Facial sensation and strength were normal. hearing was intact to finger rubbing bilaterally. Uvula tongue midline. head turning and shoulder shrug were normal and symmetric.Tongue protrusion into cheek strength  was normal. Motor: normal bulk and tone, full strength in the BUE, BLE, Sensory: normal and symmetric to light touch, pinprick, and  Vibration, in the upper and lower extremities Coordination: finger-nose-finger, no  dysmetria Reflexes: Brisk upper and lower extremities, plantar responses were flexor bilaterally. Gait and Station: Rising up from seated position without assistance, wide based  stance,  moderate stride, ambulates with rolling walker. Tandem gait not attempted.   DIAGNOSTIC DATA (LABS, IMAGING, TESTING) - I reviewed patient records, labs, notes, testing and imaging myself where available.  Lab Results  Component Value Date   WBC 6.6 11/09/2016   HGB 11.8 11/09/2016   HCT 35.8 11/09/2016   MCV 81 11/09/2016   PLT 367 11/09/2016      Component Value  Date/Time   NA 141 06/11/2017 1057   NA 141 11/09/2016 1438   K 4.3 06/11/2017 1057   CL 105 06/11/2017 1057   CO2 28 06/11/2017 1057   GLUCOSE 126 06/11/2017 1057   BUN 32 (A) 09/20/2017   CREATININE 1.0 09/20/2017   CREATININE 0.94 06/11/2017 1057   CALCIUM 8.9 06/11/2017 1057   PROT 6.9 06/11/2017 1057   PROT 7.2 11/09/2016 1438   ALBUMIN 3.8 11/09/2016 1438   AST 13 06/11/2017 1057   ALT 14 06/11/2017 1057   ALKPHOS 115 11/09/2016 1438   BILITOT 0.3 06/11/2017 1057   BILITOT 0.2 11/09/2016 1438   GFRNONAA 66 06/11/2017 1057   GFRAA 77 06/11/2017 1057   Lab Results  Component Value Date   CHOL 171 09/20/2017   HDL 57 09/20/2017   LDLCALC 91 09/20/2017   TRIG 115 09/20/2017   CHOLHDL 2.7 03/17/2016   Lab Results  Component Value Date   HGBA1C 6.7 (A) 09/20/2017   No results found for: VITAMINB12 Lab Results  Component Value Date   TSH 3.07 09/20/2017      ASSESSMENT AND PLAN  62 y.o. year old female  has a past medical history of Diabetes mellitus without complication (HCC), High cholesterol, Hypertension, Hypothyroidism,  MS (multiple sclerosis) (HCC) (relapsing remitting), and Severe obesity (BMI >= 40) (HCC) (10/11/2012).  And  mild cognitive impairment here to follow-up for her multiple sclerosis cognitive impairment has improved on Aricept.She is now on Ocrevus.  MRI of the brain 11/23/2016 with and without contrast shows the following: 1.    Multiple T2/FLAIR hyperintense foci in the periventricular, juxtacortical and deep white matter of the hemispheres and a couple foci in the infratentorial brain in a pattern and configuration consistent with chronic demyelinating plaque associated with multiple sclerosis. None of the foci appears to be acute. When compared to the MRI dated 09/15/2014, there is no definite interval change. 2.    There are no acute findings and there is a normal enhancement pattern   Memory score is stable 28/30 Continue Aricept daily for  memory Next Ocrevus infusion appt Jan 21st  Use rolling walker at all times for safer ambulation at risk for falls F/U in 6 months [name], [facility], [facility], APRN  [facility] [address] [place], [place] [address] [phone]

## 2018-01-13 ENCOUNTER — Ambulatory Visit (INDEPENDENT_AMBULATORY_CARE_PROVIDER_SITE_OTHER): Payer: Medicare Other | Admitting: Nurse Practitioner

## 2018-01-13 ENCOUNTER — Encounter: Payer: Self-pay | Admitting: Nurse Practitioner

## 2018-01-13 VITALS — BP 131/75 | HR 86 | Ht 64.0 in | Wt 233.0 lb

## 2018-01-13 DIAGNOSIS — R269 Unspecified abnormalities of gait and mobility: Secondary | ICD-10-CM | POA: Diagnosis not present

## 2018-01-13 DIAGNOSIS — G3184 Mild cognitive impairment, so stated: Secondary | ICD-10-CM

## 2018-01-13 DIAGNOSIS — G35 Multiple sclerosis: Secondary | ICD-10-CM

## 2018-01-13 MED ORDER — DONEPEZIL HCL 5 MG PO TABS: 5.0000 mg | ORAL_TABLET | Freq: Every day | ORAL | 3 refills | Status: DC

## 2018-01-13 NOTE — Patient Instructions (Signed)
Memory score is stable 28/30 Continue Aricept daily for memory Next Ocrevus infusion appt Jan 21st  F/U in 6 months

## 2018-01-14 ENCOUNTER — Ambulatory Visit: Payer: Medicare Other | Admitting: Nurse Practitioner

## 2018-01-17 DIAGNOSIS — E7800 Pure hypercholesterolemia, unspecified: Secondary | ICD-10-CM | POA: Diagnosis not present

## 2018-01-17 DIAGNOSIS — I10 Essential (primary) hypertension: Secondary | ICD-10-CM | POA: Diagnosis not present

## 2018-01-17 DIAGNOSIS — Z299 Encounter for prophylactic measures, unspecified: Secondary | ICD-10-CM | POA: Diagnosis not present

## 2018-01-17 DIAGNOSIS — E039 Hypothyroidism, unspecified: Secondary | ICD-10-CM | POA: Diagnosis not present

## 2018-01-17 DIAGNOSIS — Z6839 Body mass index (BMI) 39.0-39.9, adult: Secondary | ICD-10-CM | POA: Diagnosis not present

## 2018-01-17 DIAGNOSIS — E1165 Type 2 diabetes mellitus with hyperglycemia: Secondary | ICD-10-CM | POA: Diagnosis not present

## 2018-01-25 DIAGNOSIS — E119 Type 2 diabetes mellitus without complications: Secondary | ICD-10-CM | POA: Diagnosis not present

## 2018-01-25 DIAGNOSIS — I10 Essential (primary) hypertension: Secondary | ICD-10-CM | POA: Diagnosis not present

## 2018-01-25 DIAGNOSIS — E7800 Pure hypercholesterolemia, unspecified: Secondary | ICD-10-CM | POA: Diagnosis not present

## 2018-02-15 DIAGNOSIS — G35 Multiple sclerosis: Secondary | ICD-10-CM | POA: Diagnosis not present

## 2018-02-20 ENCOUNTER — Other Ambulatory Visit: Payer: Self-pay | Admitting: "Endocrinology

## 2018-02-21 DIAGNOSIS — E7800 Pure hypercholesterolemia, unspecified: Secondary | ICD-10-CM | POA: Diagnosis not present

## 2018-02-21 DIAGNOSIS — I10 Essential (primary) hypertension: Secondary | ICD-10-CM | POA: Diagnosis not present

## 2018-02-21 DIAGNOSIS — E119 Type 2 diabetes mellitus without complications: Secondary | ICD-10-CM | POA: Diagnosis not present

## 2018-02-28 ENCOUNTER — Encounter: Payer: Self-pay | Admitting: "Endocrinology

## 2018-02-28 ENCOUNTER — Ambulatory Visit (INDEPENDENT_AMBULATORY_CARE_PROVIDER_SITE_OTHER): Payer: Medicare Other | Admitting: "Endocrinology

## 2018-02-28 VITALS — BP 131/80 | HR 91 | Ht 64.0 in | Wt 232.0 lb

## 2018-02-28 DIAGNOSIS — I10 Essential (primary) hypertension: Secondary | ICD-10-CM

## 2018-02-28 DIAGNOSIS — E039 Hypothyroidism, unspecified: Secondary | ICD-10-CM

## 2018-02-28 DIAGNOSIS — N182 Chronic kidney disease, stage 2 (mild): Secondary | ICD-10-CM | POA: Diagnosis not present

## 2018-02-28 DIAGNOSIS — Z794 Long term (current) use of insulin: Secondary | ICD-10-CM | POA: Diagnosis not present

## 2018-02-28 DIAGNOSIS — E782 Mixed hyperlipidemia: Secondary | ICD-10-CM | POA: Diagnosis not present

## 2018-02-28 DIAGNOSIS — E1122 Type 2 diabetes mellitus with diabetic chronic kidney disease: Secondary | ICD-10-CM | POA: Diagnosis not present

## 2018-02-28 NOTE — Patient Instructions (Signed)

## 2018-02-28 NOTE — Progress Notes (Signed)
Endocrinology follow-up note   Subjective:    Patient ID: [name], female    DOB: 10/19/1955,  [name], MD   Past Medical History:  Diagnosis Date  . Diabetes mellitus without complication (HCC)   . High cholesterol   . Hypertension   . Hypothyroidism   . Morbid obesity (HCC)   . MS (multiple sclerosis) (HCC) relapsing remitting  . Severe obesity (BMI >= 40) (HCC) 10/11/2012   Past Surgical History:  Procedure Laterality Date  . BIOPSY  03/19/2015   Procedure: BIOPSY;  Surgeon: West Bali, MD;  Location: [facility];  Service: Endoscopy;;  random colon biopsy  . CHOLECYSTECTOMY  8/09  . COLONOSCOPY     about 7 years  . COLONOSCOPY WITH PROPOFOL N/A 03/19/2015   ZOX:[id number] ileum and colon/small internal hemorrhoids  . LEFT OOPHORECTOMY Left 8/11   Social History   Socioeconomic History  . Marital status: Single    Spouse name: Not on file  . Number of children: 0  . Years of education: college  . Highest education level: Not on file  Occupational History  . Occupation: disabled    Associate Professor: NOT EMPLOYED  Social Needs  . Financial resource strain: Not on file  . Food insecurity:    Worry: Not on file    Inability: Not on file  . Transportation needs:    Medical: Not on file    Non-medical: Not on file  Tobacco Use  . Smoking status: Never Smoker  . Smokeless tobacco: Never Used  Substance and Sexual Activity  . Alcohol use: No    Alcohol/week: 0.0 standard drinks    Comment: quit in 1975  . Drug use: No  . Sexual activity: Never    Birth control/protection: Post-menopausal  Lifestyle  . Physical activity:    Days per week: Not on file    Minutes per session: Not on file  . Stress: Not on file  Relationships  . Social connections:    Talks on phone: Not on file    Gets together: Not on file    Attends religious service: Not on file    Active member of club or organization: Not on file    Attends meetings of clubs or organizations:  Not on file    Relationship status: Not on file  Other Topics Concern  . Not on file  Social History Narrative   Patient is single and lives alone.   Patient is right-handed.   Patient has a college education.   Patient does not drink any caffeine.   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   Outpatient Encounter Medications as of 02/28/2018  Medication Sig  . aspirin 81 MG tablet Take 81 mg by mouth daily.  Marland Kitchen atorvastatin (LIPITOR) 40 MG tablet Take 40 mg by mouth daily.   . baclofen (LIORESAL) 10 MG tablet Take 1 tablet (10 mg total) by mouth 3 (three) times daily. Take 10 mg in am and 20mg  in pm  . Calcium Carbonate (CALCIUM 600 PO) Take 1 tablet by mouth daily.  Marland Kitchen donepezil (ARICEPT) 5 MG tablet Take 1 tablet (5 mg total) by mouth at bedtime.  . Ergocalciferol 2000 units CAPS Take 2,000 capsules by mouth daily.   Marland Kitchen glucose blood (ONE TOUCH ULTRA TEST) test strip Use as instructed 4 x daily. E11.65 One touch Ultra  . glucose blood (ONE TOUCH ULTRA TEST) test strip AS DIRECTED TO TEST BLOOD SUGAR FOUR TIMES DAILY  .  imipramine (TOFRANIL) 25 MG tablet TAKE 1 TABLET AT BEDTIME  . Insulin Glargine (LANTUS SOLOSTAR) 100 UNIT/ML Solostar Pen Inject 60 Units into the skin daily with breakfast.  . Insulin Pen Needle 31G X 8 MM MISC 1 each by Does not apply route 2 (two) times daily.  Marland Kitchen levothyroxine (SYNTHROID, LEVOTHROID) 25 MCG tablet Take 75 mcg daily before breakfast by mouth.  . metFORMIN (GLUCOPHAGE-XR) 500 MG 24 hr tablet TAKE 1 TABLET TWICE A DAY  . methenamine (HIPREX) 1 g tablet 2 (two) times daily with a meal.   . RELION PEN NEEDLES 32G X 4 MM MISC   . Tamsulosin HCl (FLOMAX) 0.4 MG CAPS Take 0.4 mg by mouth daily after breakfast.   . tolterodine (DETROL LA) 4 MG 24 hr capsule TAKE 1 CAPSULE DAILY  . TOPROL XL 25 MG 24 hr tablet Take 25 mg by mouth daily.   . TRULICITY 0.75 MG/0.5ML SOPN INJECT 0.75 MG UNDER THE SKIN ONCE A WEEK   No facility-administered encounter  medications on file as of 02/28/2018.    ALLERGIES: Allergies  Allergen Reactions  . Penicillins Anaphylaxis    Has patient had a PCN reaction causing immediate rash, facial/tongue/throat swelling, SOB or lightheadedness with hypotension: Yes Has patient had a PCN reaction causing severe rash involving mucus membranes or skin necrosis: No Has patient had a PCN reaction that required hospitalization No Has patient had a PCN reaction occurring within the last 10 years: No If all of the above answers are "NO", then may proceed with Cephalosporin use.   Marland Kitchen Lotensin [Benazepril Hcl] Cough   VACCINATION STATUS:  There is no immunization history on file for this patient.  Diabetes  She presents for her follow-up diabetic visit. She has type 2 diabetes mellitus. Onset time: She was diagnosed at approximate age of 52 years. Her disease course has been improving. There are no hypoglycemic associated symptoms. Pertinent negatives for hypoglycemia include no confusion, headaches, pallor or seizures. Associated symptoms include fatigue. Pertinent negatives for diabetes include no chest pain, no polydipsia, no polyphagia and no polyuria. There are no hypoglycemic complications. Symptoms are stable. Diabetic complications include nephropathy and peripheral neuropathy. Risk factors for coronary artery disease include diabetes mellitus, dyslipidemia, hypertension and sedentary lifestyle. Current diabetic treatment includes insulin injections and oral agent (monotherapy). She is compliant with treatment most of the time. Her weight is stable. She is following a generally unhealthy diet. She has had a previous visit with a dietitian. She never participates in exercise. There is no change in her home blood glucose trend. Her breakfast blood glucose range is generally 130-140 mg/dl. Her bedtime blood glucose range is generally 140-180 mg/dl. Her overall blood glucose range is 130-140 mg/dl. An ACE inhibitor/angiotensin  II receptor blocker is being taken.  Thyroid Problem  Presents for follow-up visit. Symptoms include fatigue. Patient reports no cold intolerance, diarrhea, heat intolerance or palpitations. The symptoms have been improving. Past treatments include levothyroxine. Her past medical history is significant for diabetes and hyperlipidemia.  Hyperlipidemia  This is a chronic problem. The current episode started more than 1 year ago. Exacerbating diseases include diabetes, hypothyroidism and obesity. Pertinent negatives include no chest pain, myalgias or shortness of breath. Current antihyperlipidemic treatment includes statins. Risk factors for coronary artery disease include diabetes mellitus, dyslipidemia, hypertension, obesity, a sedentary lifestyle and post-menopausal.  Hypertension  This is a chronic problem. The current episode started more than 1 year ago. Pertinent negatives include no chest pain, headaches, palpitations or shortness  of breath. Risk factors for coronary artery disease include dyslipidemia, diabetes mellitus, obesity, sedentary lifestyle, family history and post-menopausal state. Past treatments include angiotensin blockers. Identifiable causes of hypertension include a thyroid problem.    Review of Systems  Constitutional: Positive for fatigue. Negative for unexpected weight change.  HENT: Negative for trouble swallowing and voice change.   Eyes: Negative for visual disturbance.  Respiratory: Negative for cough, shortness of breath and wheezing.   Cardiovascular: Negative for chest pain, palpitations and leg swelling.  Gastrointestinal: Negative for diarrhea, nausea and vomiting.  Endocrine: Negative for cold intolerance, heat intolerance, polydipsia, polyphagia and polyuria.  Musculoskeletal: Negative for arthralgias and myalgias.  Skin: Negative for color change, pallor, rash and wound.  Neurological: Negative for seizures and headaches.  Psychiatric/Behavioral: Negative for  confusion and suicidal ideas.    Objective:    BP 131/80   Pulse 91   Ht 5\' 4"  (1.626 m)   Wt 232 lb (105.2 kg)   BMI 39.82 kg/m   Wt Readings from Last 3 Encounters:  02/28/18 232 lb (105.2 kg)  01/13/18 233 lb (105.7 kg)  10/27/17 233 lb (105.7 kg)    Physical Exam  Constitutional: She is oriented to person, place, and time. She appears well-developed.  HENT:  Head: Normocephalic and atraumatic.  Eyes: EOM are normal.  Neck: Normal range of motion. Neck supple. No tracheal deviation present. No thyromegaly present.  Cardiovascular: Normal rate.  Pulmonary/Chest: Effort normal.  Abdominal: There is no abdominal tenderness. There is no guarding.  Musculoskeletal:        General: No edema.     Comments: She walks with her walker due to multiple sclerosis.  Neurological: She is alert and oriented to person, place, and time. She has normal reflexes. No cranial nerve deficit. Coordination normal.  Skin: Skin is warm and dry. No rash noted. No erythema. No pallor.  Psychiatric: She has a normal mood and affect. Judgment normal.    Results for orders placed or performed in visit on 10/27/17  Basic metabolic panel  Result Value Ref Range   BUN 32 (A) 4 - 21   Creatinine 1.0 0.5 - 1.1  Lipid panel  Result Value Ref Range   Triglycerides 115 40 - 160   Cholesterol 171 0 - 200   HDL 57 35 - 70   LDL Cholesterol 91   Hemoglobin A1c  Result Value Ref Range   Hgb A1c MFr Bld 6.7 (A) 4.0 - 6.0  TSH  Result Value Ref Range   TSH 3.07 0.41 - 5.90   Complete Blood Count (Most recent): Lab Results  Component Value Date   WBC 6.6 11/09/2016   HGB 11.8 11/09/2016   HCT 35.8 11/09/2016   MCV 81 11/09/2016   PLT 367 11/09/2016   Chemistry (most recent): Lab Results  Component Value Date   NA 141 06/11/2017   K 4.3 06/11/2017   CL 105 06/11/2017   CO2 28 06/11/2017   BUN 32 (A) 09/20/2017   CREATININE 1.0 09/20/2017   Diabetic Labs (most recent): Lab Results   Component Value Date   HGBA1C 6.7 (A) 09/20/2017   HGBA1C 6.9 (H) 06/11/2017   HGBA1C 10.9 (H) 03/01/2017   Lipid Panel     Component Value Date/Time   CHOL 171 09/20/2017   TRIG 115 09/20/2017   HDL 57 09/20/2017   CHOLHDL 2.7 03/17/2016 1228   VLDL 23 03/17/2016 1228   LDLCALC 91 09/20/2017    Assessment & Plan:  1. Type 2 diabetes mellitus with stage 2 chronic kidney disease, with long-term current use of insulin (HCC)  -Her CKD has reversed,  patient remains at a high risk for more acute and chronic complications of diabetes which include CAD, CVA, CKD, retinopathy, and neuropathy. These are all discussed in detail with the patient. - Lately,  she is getting a big help from her sisters , [name] and [name].  - They make sure that Margaret LundJanet gets her medications especially the morning injections of insulin.  Her A1c has improved to 6.6%, overall improving from 10.9% .    - I have re-counseled the patient on diet management and weight loss  by adopting a carbohydrate restricted / protein rich  Diet.  - Patient admits there is a room for improvement in her diet and drink choices. -  Suggestion is made for her to avoid simple carbohydrates  from her diet including Cakes, Sweet Desserts / Pastries, Ice Cream, Soda (diet and regular), Sweet Tea, Candies, Chips, Cookies, Store Bought Juices, Alcohol in Excess of  1-2 drinks a day, Artificial Sweeteners, and "Sugar-free" Products. This will help patient to have stable blood glucose profile and potentially avoid unintended weight gain.   - Patient is advised to stick to a routine mealtimes to eat 3 meals  a day and avoid unnecessary snacks ( to snack only to correct hypoglycemia).  - I have approached patient with the following individualized plan to manage diabetes and patient agrees.  -Based on her presentation with controlled glycemic profile, she will continue to benefit from simplified insulin treatment program, she will not need prandial  insulin.  -She is advised to continue Lantus 60 units  q. a.m. with breakfast,  associated with strict monitoring of blood glucose 2 times a day-before breakfast  and at bedtime. -She  is encouraged to call clinic for blood glucose levels less than 70 or above 200 mg /dl. -She has normal renal function, will continue to benefit from metformin treatment.  She is advised to continue  metformin ER  at 500mg  po twice a day.  -She is advised to continue Trulicity 0.75 mg subcutaneously weekly.   -Target numbers for A1c, LDL, HDL, Triglycerides, Waist Circumference were discussed in detail.   - Patient specific target  for A1c; LDL, HDL, Triglycerides, and  Waist Circumference were discussed in detail.  2) BP/HTN: Her blood pressure is controlled to target.  She is advised to continue current medications including Toprol 25 mg p.o. daily .  3) Lipids/HPL: Her recent lipid panel showed uncontrolled LDL at 91.  She is advised to continue atorvastatin 40 mg p.o. nightly.   4)  Weight/Diet: CDE consult in progress, exercise, and carbohydrates information provided.  5)  Primary hypothyroidism -Her previsit thyroid function tests are consistent with appropriate replacement. -I advised her to continue levothyroxine 75 mcg p.o. every morning.    - We discussed about the correct intake of her thyroid hormone, on empty stomach at fasting, with water, separated by at least 30 minutes from breakfast and other medications,  and separated by more than 4 hours from calcium, iron, multivitamins, acid reflux medications (PPIs). -Patient is made aware of the fact that thyroid hormone replacement is needed for life, dose to be adjusted by periodic monitoring of thyroid function tests.   6) Chronic Care/Health Maintenance:  -Patient is on ACEI/ARB and Statin medications and encouraged to continue to follow up with Ophthalmology, Podiatrist at least yearly or according to recommendations, and advised to  stay away  from smoking. I have recommended yearly flu vaccine and pneumonia vaccination at least every 5 years; and  sleep for at least 7 hours a day.  I advised patient to maintain close follow up with her PCP for primary care needs.  - Time spent with the patient: 25 min, of which >50% was spent in reviewing her blood glucose logs , discussing her hypoglycemia and hyperglycemia episodes, reviewing her current and  previous labs / studies and medications  doses and developing a plan to avoid hypoglycemia and hyperglycemia. Please refer to Patient Instructions for Blood Glucose Monitoring and Insulin/Medications Dosing Guide"  in media tab for additional information. [name] participated in the discussions, expressed understanding, and voiced agreement with the above plans.  All questions were answered to her satisfaction. she is encouraged to contact clinic should she have any questions or concerns prior to her return visit.  Follow up plan: Return in about 4 months (around 06/29/2018) for Meter, and Logs.  Marquis Lunch, MD Phone: [phone]  Fax: [phone]   02/28/2018, 4:21 PM

## 2018-03-06 ENCOUNTER — Other Ambulatory Visit: Payer: Self-pay | Admitting: "Endocrinology

## 2018-03-09 ENCOUNTER — Telehealth: Payer: Self-pay

## 2018-03-09 DIAGNOSIS — N182 Chronic kidney disease, stage 2 (mild): Principal | ICD-10-CM

## 2018-03-09 DIAGNOSIS — Z794 Long term (current) use of insulin: Principal | ICD-10-CM

## 2018-03-09 DIAGNOSIS — E1122 Type 2 diabetes mellitus with diabetic chronic kidney disease: Secondary | ICD-10-CM

## 2018-03-09 MED ORDER — DULAGLUTIDE 0.75 MG/0.5ML ~~LOC~~ SOAJ: SUBCUTANEOUS | 1 refills | Status: DC

## 2018-03-09 MED ORDER — INSULIN GLARGINE 100 UNIT/ML SOLOSTAR PEN: PEN_INJECTOR | SUBCUTANEOUS | 1 refills | Status: DC

## 2018-03-09 NOTE — Telephone Encounter (Signed)
LeighAnn Mikenzi Raysor, CMA  

## 2018-03-21 DIAGNOSIS — E119 Type 2 diabetes mellitus without complications: Secondary | ICD-10-CM | POA: Diagnosis not present

## 2018-03-21 DIAGNOSIS — I10 Essential (primary) hypertension: Secondary | ICD-10-CM | POA: Diagnosis not present

## 2018-03-21 DIAGNOSIS — E7800 Pure hypercholesterolemia, unspecified: Secondary | ICD-10-CM | POA: Diagnosis not present

## 2018-04-19 DIAGNOSIS — E7800 Pure hypercholesterolemia, unspecified: Secondary | ICD-10-CM | POA: Diagnosis not present

## 2018-04-19 DIAGNOSIS — E119 Type 2 diabetes mellitus without complications: Secondary | ICD-10-CM | POA: Diagnosis not present

## 2018-04-19 DIAGNOSIS — I10 Essential (primary) hypertension: Secondary | ICD-10-CM | POA: Diagnosis not present

## 2018-04-29 DIAGNOSIS — I10 Essential (primary) hypertension: Secondary | ICD-10-CM | POA: Diagnosis not present

## 2018-04-29 DIAGNOSIS — E119 Type 2 diabetes mellitus without complications: Secondary | ICD-10-CM | POA: Diagnosis not present

## 2018-04-29 DIAGNOSIS — E7800 Pure hypercholesterolemia, unspecified: Secondary | ICD-10-CM | POA: Diagnosis not present

## 2018-05-28 ENCOUNTER — Other Ambulatory Visit: Payer: Self-pay | Admitting: Neurology

## 2018-06-08 ENCOUNTER — Other Ambulatory Visit: Payer: Self-pay

## 2018-06-08 MED ORDER — GLUCOSE BLOOD VI STRP: ORAL_STRIP | 5 refills | Status: DC

## 2018-06-15 DIAGNOSIS — E1165 Type 2 diabetes mellitus with hyperglycemia: Secondary | ICD-10-CM | POA: Diagnosis not present

## 2018-06-15 DIAGNOSIS — Z299 Encounter for prophylactic measures, unspecified: Secondary | ICD-10-CM | POA: Diagnosis not present

## 2018-06-15 DIAGNOSIS — G35 Multiple sclerosis: Secondary | ICD-10-CM | POA: Diagnosis not present

## 2018-06-15 DIAGNOSIS — I10 Essential (primary) hypertension: Secondary | ICD-10-CM | POA: Diagnosis not present

## 2018-06-15 DIAGNOSIS — Z6839 Body mass index (BMI) 39.0-39.9, adult: Secondary | ICD-10-CM | POA: Diagnosis not present

## 2018-06-15 DIAGNOSIS — E039 Hypothyroidism, unspecified: Secondary | ICD-10-CM | POA: Diagnosis not present

## 2018-06-21 DIAGNOSIS — E1122 Type 2 diabetes mellitus with diabetic chronic kidney disease: Secondary | ICD-10-CM | POA: Diagnosis not present

## 2018-06-21 DIAGNOSIS — E039 Hypothyroidism, unspecified: Secondary | ICD-10-CM | POA: Diagnosis not present

## 2018-06-21 DIAGNOSIS — Z794 Long term (current) use of insulin: Secondary | ICD-10-CM | POA: Diagnosis not present

## 2018-06-21 DIAGNOSIS — N182 Chronic kidney disease, stage 2 (mild): Secondary | ICD-10-CM | POA: Diagnosis not present

## 2018-06-22 LAB — COMPLETE METABOLIC PANEL WITH GFR
AG Ratio: 1.3 (calc) (ref 1.0–2.5)
ALT: 17 U/L (ref 6–29)
AST: 15 U/L (ref 10–35)
Albumin: 3.8 g/dL (ref 3.6–5.1)
Alkaline phosphatase (APISO): 76 U/L (ref 37–153)
BUN/Creatinine Ratio: 44 (calc) — ABNORMAL HIGH (ref 6–22)
BUN: 38 mg/dL — ABNORMAL HIGH (ref 7–25)
CO2: 28 mmol/L (ref 20–32)
Calcium: 9.4 mg/dL (ref 8.6–10.4)
Chloride: 106 mmol/L (ref 98–110)
Creat: 0.86 mg/dL (ref 0.50–0.99)
GFR, Est African American: 85 mL/min/{1.73_m2} (ref >=60)
GFR, Est Non African American: 73 mL/min/{1.73_m2} (ref >=60)
Globulin: 2.9 g/dL (calc) (ref 1.9–3.7)
Glucose, Bld: 88 mg/dL (ref 65–139)
Potassium: 4.8 mmol/L (ref 3.5–5.3)
Sodium: 142 mmol/L (ref 135–146)
Total Bilirubin: 0.4 mg/dL (ref 0.2–1.2)
Total Protein: 6.7 g/dL (ref 6.1–8.1)

## 2018-06-22 LAB — HEMOGLOBIN A1C
Hgb A1c MFr Bld: 6.5 % of total Hgb — ABNORMAL HIGH (ref <5.7)
Mean Plasma Glucose: 140 (calc)
eAG (mmol/L): 7.7 (calc)

## 2018-06-22 LAB — TSH: TSH: 2.56 mIU/L (ref 0.40–4.50)

## 2018-06-22 LAB — T4, FREE: Free T4: 1 ng/dL (ref 0.8–1.8)

## 2018-06-25 ENCOUNTER — Other Ambulatory Visit: Payer: Self-pay | Admitting: "Endocrinology

## 2018-06-25 DIAGNOSIS — E1122 Type 2 diabetes mellitus with diabetic chronic kidney disease: Secondary | ICD-10-CM

## 2018-06-27 ENCOUNTER — Telehealth: Payer: Self-pay

## 2018-06-27 ENCOUNTER — Ambulatory Visit: Payer: Medicare Other | Admitting: Neurology

## 2018-06-27 NOTE — Telephone Encounter (Signed)
Spoke with the patient's sister(DPR verified) and they have given verbal consent to file their insurance and to do a doxy.me visit. E-mail, mobile number and carrier have been confirmed and sent.   Text: [phone] Development worker, international aid)

## 2018-06-29 ENCOUNTER — Ambulatory Visit: Payer: Medicare Other | Admitting: "Endocrinology

## 2018-07-03 ENCOUNTER — Other Ambulatory Visit: Payer: Self-pay | Admitting: Neurology

## 2018-07-04 ENCOUNTER — Ambulatory Visit (INDEPENDENT_AMBULATORY_CARE_PROVIDER_SITE_OTHER): Payer: Medicare Other | Admitting: Family Medicine

## 2018-07-04 ENCOUNTER — Encounter: Payer: Self-pay | Admitting: "Endocrinology

## 2018-07-04 ENCOUNTER — Ambulatory Visit (INDEPENDENT_AMBULATORY_CARE_PROVIDER_SITE_OTHER): Payer: Medicare Other | Admitting: "Endocrinology

## 2018-07-04 ENCOUNTER — Other Ambulatory Visit: Payer: Self-pay

## 2018-07-04 ENCOUNTER — Encounter: Payer: Self-pay | Admitting: Family Medicine

## 2018-07-04 VITALS — BP 132/84 | HR 64 | Ht 64.0 in | Wt 226.0 lb

## 2018-07-04 DIAGNOSIS — G35 Multiple sclerosis: Secondary | ICD-10-CM

## 2018-07-04 DIAGNOSIS — Z794 Long term (current) use of insulin: Secondary | ICD-10-CM

## 2018-07-04 DIAGNOSIS — E1122 Type 2 diabetes mellitus with diabetic chronic kidney disease: Secondary | ICD-10-CM | POA: Diagnosis not present

## 2018-07-04 DIAGNOSIS — I10 Essential (primary) hypertension: Secondary | ICD-10-CM

## 2018-07-04 DIAGNOSIS — E039 Hypothyroidism, unspecified: Secondary | ICD-10-CM | POA: Diagnosis not present

## 2018-07-04 DIAGNOSIS — G3184 Mild cognitive impairment, so stated: Secondary | ICD-10-CM

## 2018-07-04 DIAGNOSIS — E782 Mixed hyperlipidemia: Secondary | ICD-10-CM

## 2018-07-04 DIAGNOSIS — N182 Chronic kidney disease, stage 2 (mild): Secondary | ICD-10-CM

## 2018-07-04 NOTE — Progress Notes (Signed)
PATIENT: [name]: [birth year]  REASON FOR VISIT: follow up HISTORY FROM: patient  Virtual Visit via Telephone Note  I connected with [name] on 07/04/18 at  3:00 PM EDT by telephone and verified that I am speaking with the correct person using two identifiers.   I discussed the limitations, risks, security and privacy concerns of performing an evaluation and management service by telephone and the availability of in person appointments. I also discussed with the patient that there may be a patient responsible charge related to this service. The patient expressed understanding and agreed to proceed.   History of Present Illness:  07/04/18 [name] is a 63 y.o. female here Hanson for follow up of MS and mild cognitive impairment.  [name] reports that she is feeling well.  Last [name] infusion was in January.  She is scheduled for her next infusion in July.  She denies any new numbness.  No changes in symptoms.  No falls.  She feels that her cognition is fairly stable on Aricept.  She does not drive.  She does live alone.  She is able to dress and bathe herself.  She is tolerating medications well.  She has no concerns Hanson.   HISTORY (copied from [name]'s note on 01/13/2018)  UPDATE 12/19/2019CM [name], 63 year old female returns for follow-up with relapsing remitting multiple sclerosis and mild cognitive impairment.  She is currently on Aricept and she feels her memory is stable.  She is on [name] IV every 6 months for her multiple sclerosis.  Her next infusion is February 15, 2018.  MRI of the brain 11/23/2016 with and without contrast shows the following: 1. Multiple T2/FLAIR hyperintense foci in the periventricular, juxtacortical and deep white matter of the hemispheres and a couple foci in the infratentorial brain in a pattern and configuration consistent with chronic demyelinating plaque associated with multiple sclerosis. None of the foci appears to  be acute. When compared to the MRI dated 09/15/2014, there is no definite interval change. 2. There are no acute findings and there is a normal enhancement pattern She is ambulating with a rolling walker.  She has had one fall in 6 months no injury.  She returns for reevaluation with no new concerns.  UPDATE 5/3/2019CM [name], [id number] year old female returns for follow-up with history of multiple sclerosis and mild cognitive impairment.  She was placed on Aricept by [name] and patient and sister has seen improvement.  MMSE Hanson 29 out of 30 in addition she has been placed on [name] IV infusion for her multiple sclerosis.  Her next infusion is July 22nd.  She continues to use her rolling walker for ambulation.  She has not had any falls.  She returns for reevaluation without any further neurologic complaints.   Interval history from 23 December 2016, I am seeing [name] here Hanson long-established patient with MS and recent cognitive decline, in the presence of her sister Margaret Muskratamela Hanson, [place]. In our last visit 6 weeks ago we had discussed to start endocrine fluids for the treatment of MS which helps her to be compliant. The first infusion is scheduled for December 27 th.In preparation we had obtained a blood test for tuberculosis was returned negative, negative hepatitis panel, comprehensive metabolicpanel and CBC.Her creatinine is a slight bit elevated, but her glomerular filtration rate was 59 -which is still considered normal.  She also had high blood sugars. White and red blood cell counts were normal region.There was  no anemia.  The patient's MRI from 23 November 2016 showed multiple T2 and FLAIR hyperintense foci in the pattern and configuration consistent with demyelinating plaques. This is a chronic multiple sclerosis as none of the foci appear to be acute. I consider the patient a good candidate for [name]. I was pleasantly surprised to see the patient looking  brighter, more alert and certainly overall healthy.Hanson . She has eaten more regularly and more fresh food, she also has more social interaction leaves the house more often. Appears more alert. She is able to walk with her 4 wheeled walker. Mrs Nonie Hoyer felt that Aricept has had a positive effect on her sister. We will repeat a MOCA/ MMSE in 3 month , next visit with NP  10-15-2018[name] a 63 y.o.femaleIs seen here as a revisit from [name] multiple sclerosis follow up.  I had the pleasure of seeing [name] in the presence of her sister, physician assistant [name]. I know [name] from the [facility] radiology department where she has worked with the interventional team. I last saw [name] in late 2017 when our goal was to change her to a medication of infusion character, namely [name]. At the time she had an MRI with out contrast that confirmed that and as was her diagnosis, but could not 100% differentiate between acute and more remote lesions. Now, almost a year later when it again as [name] had missed some of her appointments. From now on she will bring her sister with her. My goal is still to change the patient to a therapy that doesn't need to daily or every other day compliance neither pill no injection but rather an infusion therapy here in office. Low compliance would therefore not be as impairing. I would still like to change her to [name]- but will have to repeat the hepatitis panel, the gold tuberculosis test and the patient information form. She is not driving any longer but lives alone- close to her sister. CD   The patient had followed [name] for over 20 years , diagnosed since 1989 ,, and is now mainly seen by Margaret Rubin, NP. She has never been on another medication, but Betaseron. In my last visit , 2013 , I suggested Ampyra to her , which we than initiated but had to D/C after her kidney function changed.   The patient is  diabetic, diagnosed in 2008. She just changed in 2014 to insulin.  She remains on Betaseron. Has been gaining weight and has had a fall last December. Uses a Rollator when she is outdoors but indoors a walking stick or cane, but more and more frequently the walker. .  She believes her sleep is poor due to spasms and nocturia, she may snore. Still wakes with a dry mouth, has been told she snores. Sleeps prone. The patient underwent a sleep study I believe in 2014, but was unable to tolerate the CPAP due to her sleeping habit. She has a chief complaint Hanson of cramping pain in the left leg, spasm, at the groin and right above the hip. No radiation , no burning and no deep ache - just spams. Onset 3 weeks ago. Although, she has taken baclofen po now tid for a long time , for the " MS hug" - it had an effect only on abdominal spasms. On the same dose, it has not affected the hip spasms.  She believes her sleep is poor due to spasms and nocturia, she may  snore. Wakes with a dry mouth, has been told she snores. Sleeps prone.  The spasms in the left hip are preceded by sitting in certain low chairs in [employer] , her volunteer work place. Dr. Sherril Croon recently did some routine Labs, that are not available on EPIC.    Observations/Objective:  Generalized: Well developed, in no acute distress  Mentation: Alert oriented to time, place, history taking. Follows all commands speech and language fluent   Assessment and Plan:  63 y.o. year old female  has a past medical history of Diabetes mellitus without complication (HCC), High cholesterol, Hypertension, Hypothyroidism, Morbid obesity (HCC), MS (multiple sclerosis) (HCC) (relapsing remitting), and Severe obesity (BMI >= 40) (HCC) (10/11/2012). here with    ICD-10-CM   1. MS (multiple sclerosis) (HCC) G35   2. Amnestic MCI (mild cognitive impairment with memory loss) G31.84    She is doing very well on [name] infusions and Aricept as prescribed.  We  will continue current therapy.  She was advised to call with any new or worsening symptoms.  We will follow-up in 6 months.  She and her sister verbalized understanding and agreement with this plan.  No orders of the defined types were placed in this encounter.   No orders of the defined types were placed in this encounter.    Follow Up Instructions:  I discussed the assessment and treatment plan with the patient. The patient was provided an opportunity to ask questions and all were answered. The patient agreed with the plan and demonstrated an understanding of the instructions.   The patient was advised to call back or seek an in-person evaluation if the symptoms worsen or if the condition fails to improve as anticipated.  I provided 20 minutes of non-face-to-face time during this encounter.  Patient and her sister, Elita Quick, are located at their place of residence during video conference.  Provider is located at her place of residence.  Rozell Searing, CMA helped to facilitate visit.   Shawnie Dapper, NP

## 2018-07-04 NOTE — Progress Notes (Signed)
Endocrinology follow-up note   Subjective:    Patient ID: [name], female    DOB: 09-13-1955,  Ignatius SpeckingVyas, Dhruv B, MD   Past Medical History:  Diagnosis Date  . Diabetes mellitus without complication (HCC)   . High cholesterol   . Hypertension   . Hypothyroidism   . Morbid obesity (HCC)   . MS (multiple sclerosis) (HCC) relapsing remitting  . Severe obesity (BMI >= 40) (HCC) 10/11/2012   Past Surgical History:  Procedure Laterality Date  . BIOPSY  03/19/2015   Procedure: BIOPSY;  Surgeon: West BaliSandi L Fields, MD;  Location: [facility];  Service: Endoscopy;;  random colon biopsy  . CHOLECYSTECTOMY  8/09  . COLONOSCOPY     about 7 years  . COLONOSCOPY WITH PROPOFOL N/A 03/19/2015   ZOX:[id number]:normal ileum and colon/small internal hemorrhoids  . LEFT OOPHORECTOMY Left 8/11   Social History   Socioeconomic History  . Marital status: Single    Spouse name: Not on file  . Number of children: 0  . Years of education: college  . Highest education level: Not on file  Occupational History  . 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 Financial resource strain: Not on file  . Food insecurity:    Worry: Not on file    Inability: Not on file  . Transportation needs:    Medical: Not on file    Non-medical: Not on file  Tobacco Use  . Smoking status: Never Smoker  . Smokeless tobacco: Never Used  Substance and Sexual Activity  . Alcohol use: No    Alcohol/week: 0.0 standard drinks    Comment: quit in 1975  . Drug use: No  . Sexual activity: Never    Birth control/protection: Post-menopausal  Lifestyle  . Physical activity:    Days per week: Not on file    Minutes per session: Not on file  . Stress: Not on file  Relationships  . Social connections:    Talks on phone: Not on file    Gets together: Not on file    Attends religious service: Not on file    Active member of club or organization: Not on file    Attends meetings of clubs or organizations:  Not on file    Relationship status: Not on file  Other Topics Concern  . Not on file  Social History Narrative   Patient is single and lives alone.   Patient is right-handed.   Patient has a college education.   Patient does not drink any caffeine.   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   Outpatient Encounter Medications as of 07/04/2018  Medication Sig  . aspirin 81 MG tablet Take 81 mg by mouth daily.  Marland Kitchen. atorvastatin (LIPITOR) 40 MG tablet Take 40 mg by mouth daily.   . B-D ULTRAFINE III SHORT PEN 31G X 8 MM MISC USE 1 PEN NEEDLE TWICE A DAY  . baclofen (LIORESAL) 10 MG tablet TAKE 1 TABLET (10 MG) IN THE MORNING AND 2 TABLETS (20 MG) IN THE EVENING  . Calcium Carbonate (CALCIUM 600 PO) Take 1 tablet by mouth daily.  Marland Kitchen. donepezil (ARICEPT) 5 MG tablet Take 1 tablet (5 mg total) by mouth at bedtime.  . Dulaglutide (TRULICITY) 0.75 MG/0.5ML SOPN INJECT 0.5 ML (0.75 MG) UNDER THE SKIN ONCE WEEKLY  . Ergocalciferol 2000 units CAPS Take 2,000 capsules by mouth daily.   Marland Kitchen. glucose blood (ONE TOUCH ULTRA TEST)  test strip AS DIRECTED TO TEST BLOOD SUGAR FOUR TIMES DAILY  . glucose blood (ONE TOUCH ULTRA TEST) test strip Use as instructed 2 x daily. E11.65 One touch Ultra  . imipramine (TOFRANIL) 25 MG tablet TAKE 1 TABLET AT BEDTIME  . Insulin Glargine (LANTUS SOLOSTAR) 100 UNIT/ML Solostar Pen INJECT 60 UNITS UNDER THE SKIN DAILY WITH BREAKFAST  . levothyroxine (SYNTHROID, LEVOTHROID) 25 MCG tablet Take 75 mcg daily before breakfast by mouth.  . metFORMIN (GLUCOPHAGE-XR) 500 MG 24 hr tablet TAKE 1 TABLET TWICE A DAY  . methenamine (HIPREX) 1 g tablet 2 (two) times daily with a meal.   . RELION PEN NEEDLES 32G X 4 MM MISC   . Tamsulosin HCl (FLOMAX) 0.4 MG CAPS Take 0.4 mg by mouth daily after breakfast.   . tolterodine (DETROL LA) 4 MG 24 hr capsule TAKE 1 CAPSULE DAILY  . TOPROL XL 25 MG 24 hr tablet Take 25 mg by mouth daily.    No facility-administered encounter medications  on file as of 07/04/2018.    ALLERGIES: Allergies  Allergen Reactions  . Penicillins Anaphylaxis    Has patient had a PCN reaction causing immediate rash, facial/tongue/throat swelling, SOB or lightheadedness with hypotension: Yes Has patient had a PCN reaction causing severe rash involving mucus membranes or skin necrosis: No Has patient had a PCN reaction that required hospitalization No Has patient had a PCN reaction occurring within the last 10 years: No If all of the above answers are "NO", then may proceed with Cephalosporin use.   Marland Kitchen. Lotensin [Benazepril Hcl] Cough   VACCINATION STATUS:  There is no immunization history on file for this patient.  Diabetes  She presents for her follow-up diabetic visit. She has type 2 diabetes mellitus. Onset time: She was diagnosed at approximate age of 52 years. Her disease course has been stable. There are no hypoglycemic associated symptoms. Pertinent negatives for hypoglycemia include no confusion, headaches, pallor or seizures. Associated symptoms include fatigue. Pertinent negatives for diabetes include no chest pain, no polydipsia, no polyphagia and no polyuria. There are no hypoglycemic complications. Symptoms are stable. Diabetic complications include nephropathy and peripheral neuropathy. Risk factors for coronary artery disease include diabetes mellitus, dyslipidemia, hypertension and sedentary lifestyle. Current diabetic treatment includes insulin injections and oral agent (monotherapy). She is compliant with treatment most of the time. Her weight is stable. She is following a generally unhealthy diet. She has had a previous visit with a dietitian. She never participates in exercise. There is no change in her home blood glucose trend. Her breakfast blood glucose range is generally 130-140 mg/dl. Her bedtime blood glucose range is generally 140-180 mg/dl. Her overall blood glucose range is 130-140 mg/dl. An ACE inhibitor/angiotensin II receptor  blocker is being taken.  Thyroid Problem  Presents for follow-up visit. Symptoms include fatigue. Patient reports no cold intolerance, diarrhea, heat intolerance or palpitations. The symptoms have been improving. Past treatments include levothyroxine. Her past medical history is significant for diabetes and hyperlipidemia.  Hyperlipidemia  This is a chronic problem. The current episode started more than 1 year ago. Exacerbating diseases include diabetes, hypothyroidism and obesity. Pertinent negatives include no chest pain, myalgias or shortness of breath. Current antihyperlipidemic treatment includes statins. Risk factors for coronary artery disease include diabetes mellitus, dyslipidemia, hypertension, obesity, a sedentary lifestyle and post-menopausal.  Hypertension  This is a chronic problem. The current episode started more than 1 year ago. Pertinent negatives include no chest pain, headaches, palpitations or shortness of breath. Risk  factors for coronary artery disease include dyslipidemia, diabetes mellitus, obesity, sedentary lifestyle, family history and post-menopausal state. Past treatments include angiotensin blockers. Identifiable causes of hypertension include a thyroid problem.    Review of Systems  Constitutional: Positive for fatigue. Negative for unexpected weight change.  HENT: Negative for trouble swallowing and voice change.   Eyes: Negative for visual disturbance.  Respiratory: Negative for cough, shortness of breath and wheezing.   Cardiovascular: Negative for chest pain, palpitations and leg swelling.  Gastrointestinal: Negative for diarrhea, nausea and vomiting.  Endocrine: Negative for cold intolerance, heat intolerance, polydipsia, polyphagia and polyuria.  Musculoskeletal: Negative for arthralgias and myalgias.  Skin: Negative for color change, pallor, rash and wound.  Neurological: Negative for seizures and headaches.  Psychiatric/Behavioral: Negative for confusion  and suicidal ideas.    Objective:    BP 132/84   Pulse 64   Ht 5\' 4"  (1.626 m)   Wt 226 lb (102.5 kg)   BMI 38.79 kg/m   Wt Readings from Last 3 Encounters:  07/04/18 226 lb (102.5 kg)  02/28/18 232 lb (105.2 kg)  01/13/18 233 lb (105.7 kg)    Physical Exam  Constitutional: She is oriented to person, place, and time. She appears well-developed.  HENT:  Head: Normocephalic and atraumatic.  Eyes: EOM are normal.  Neck: Normal range of motion. Neck supple. No tracheal deviation present. No thyromegaly present.  Cardiovascular: Normal rate.  Pulmonary/Chest: Effort normal.  Abdominal: There is no abdominal tenderness. There is no guarding.  Musculoskeletal:        General: No edema.     Comments: She walks with her walker due to multiple sclerosis.  Neurological: She is alert and oriented to person, place, and time. She has normal reflexes. No cranial nerve deficit. Coordination normal.  Skin: Skin is warm and dry. No rash noted. No erythema. No pallor.  Psychiatric: She has a normal mood and affect. Judgment normal.    Results for orders placed or performed in visit on 02/28/18  Hemoglobin A1c  Result Value Ref Range   Hgb A1c MFr Bld 6.5 (H) <5.7 % of total Hgb   Mean Plasma Glucose 140 (calc)   eAG (mmol/L) 7.7 (calc)  COMPLETE METABOLIC PANEL WITH GFR  Result Value Ref Range   Glucose, Bld 88 65 - 139 mg/dL   BUN 38 (H) 7 - 25 mg/dL   Creat 8.84 1.66 - 0.63 mg/dL   GFR, Est Non African American 73 > OR = 60 mL/min/1.16m2   GFR, Est African American 85 > OR = 60 mL/min/1.64m2   BUN/Creatinine Ratio 44 (H) 6 - 22 (calc)   Sodium 142 135 - 146 mmol/L   Potassium 4.8 3.5 - 5.3 mmol/L   Chloride 106 98 - 110 mmol/L   CO2 28 20 - 32 mmol/L   Calcium 9.4 8.6 - 10.4 mg/dL   Total Protein 6.7 6.1 - 8.1 g/dL   Albumin 3.8 3.6 - 5.1 g/dL   Globulin 2.9 1.9 - 3.7 g/dL (calc)   AG Ratio 1.3 1.0 - 2.5 (calc)   Total Bilirubin 0.4 0.2 - 1.2 mg/dL   Alkaline phosphatase  (APISO) 76 37 - 153 U/L   AST 15 10 - 35 U/L   ALT 17 6 - 29 U/L  TSH  Result Value Ref Range   TSH 2.56 0.40 - 4.50 mIU/L  T4, free  Result Value Ref Range   Free T4 1.0 0.8 - 1.8 ng/dL   Complete Blood Count (Most recent):  Lab Results  Component Value Date   WBC 6.6 11/09/2016   HGB 11.8 11/09/2016   HCT 35.8 11/09/2016   MCV 81 11/09/2016   PLT 367 11/09/2016   Chemistry (most recent): Lab Results  Component Value Date   NA 142 06/21/2018   K 4.8 06/21/2018   CL 106 06/21/2018   CO2 28 06/21/2018   BUN 38 (H) 06/21/2018   CREATININE 0.86 06/21/2018   Diabetic Labs (most recent): Lab Results  Component Value Date   HGBA1C 6.5 (H) 06/21/2018   HGBA1C 6.7 (A) 09/20/2017   HGBA1C 6.9 (H) 06/11/2017   Lipid Panel     Component Value Date/Time   CHOL 171 09/20/2017   TRIG 115 09/20/2017   HDL 57 09/20/2017   CHOLHDL 2.7 03/17/2016 1228   VLDL 23 03/17/2016 1228   LDLCALC 91 09/20/2017    Assessment & Plan:   1. Type 2 diabetes mellitus with stage 2 chronic kidney disease, with long-term current use of insulin (HCC)  -Her CKD has reversed,  patient remains at a high risk for more acute and chronic complications of diabetes which include CAD, CVA, CKD, retinopathy, and neuropathy. These are all discussed in detail with the patient. - Lately,  she is getting a big help from her sisters , Elita Quickam and Selena BattenKim.  - They make sure that Marylu LundJanet gets her medications especially the morning injections of insulin.  Her A1c has improved to 6.5%, overall improving from 10.9% .    - I have re-counseled the patient on diet management and weight loss  by adopting a carbohydrate restricted / protein rich  Diet.  - Patient admits there is a room for improvement in her diet and drink choices.   - she  admits there is a room for improvement in her diet and drink choices. -  Suggestion is made for her to avoid simple carbohydrates  from her diet including Cakes, Sweet Desserts / Pastries,  Ice Cream, Soda (diet and regular), Sweet Tea, Candies, Chips, Cookies, Sweet Pastries,  Store Bought Juices, Alcohol in Excess of  1-2 drinks a day, Artificial Sweeteners, Coffee Creamer, and "Sugar-free" Products. This will help patient to have stable blood glucose profile and potentially avoid unintended weight gain.  - Patient is advised to stick to a routine mealtimes to eat 3 meals  a day and avoid unnecessary snacks ( to snack only to correct hypoglycemia).  - I have approached patient with the following individualized plan to manage diabetes and patient agrees.  -Based on her presentation with controlled glycemic profile, she will continue to benefit from simplified insulin treatment program, she will not need prandial insulin.  -She is advised to continue Lantus 60 units  q. a.m. with breakfast,  associated with strict monitoring of blood glucose 2 times a day-before breakfast  and at bedtime. -She  is encouraged to call clinic for blood glucose levels less than 70 or above 200 mg /dl. -She has normal renal function, will continue to benefit from metformin treatment.  She is advised to continue  metformin ER  at 500mg  po twice a day.  -She is advised to continue Trulicity 0.75 mg subcutaneously weekly.   -Target numbers for A1c, LDL, HDL, Triglycerides, Waist Circumference were discussed in detail.   - Patient specific target  for A1c; LDL, HDL, Triglycerides, and  Waist Circumference were discussed in detail.  2) BP/HTN: Her blood pressure is controlled to target.  She is advised to continue current medications including Toprol 25  mg p.o. daily .  3) Lipids/HPL: Her recent lipid panel showed uncontrolled LDL at 91.  She is advised to continue atorvastatin 40 mg p.o. nightly.   4)  Weight/Diet: CDE consult in progress, exercise, and carbohydrates information provided.  5)  Primary hypothyroidism -Her previsit thyroid function tests are consistent with appropriate replacement. -I  advised her to continue levothyroxine 75 mcg p.o. every morning.    - We discussed about the correct intake of her thyroid hormone, on empty stomach at fasting, with water, separated by at least 30 minutes from breakfast and other medications,  and separated by more than 4 hours from calcium, iron, multivitamins, acid reflux medications (PPIs). -Patient is made aware of the fact that thyroid hormone replacement is needed for life, dose to be adjusted by periodic monitoring of thyroid function tests.   6) Chronic Care/Health Maintenance:  -Patient is on ACEI/ARB and Statin medications and encouraged to continue to follow up with Ophthalmology, Podiatrist at least yearly or according to recommendations, and advised to  stay away from smoking. I have recommended yearly flu vaccine and pneumonia vaccination at least every 5 years; and  sleep for at least 7 hours a day.  I advised patient to maintain close follow up with her PCP for primary care needs. - Time spent with the patient: 25 min, of which >50% was spent in reviewing her blood glucose logs , discussing her hypoglycemia and hyperglycemia episodes, reviewing her current and  previous labs / studies and medications  doses and developing a plan to avoid hypoglycemia and hyperglycemia. Please refer to Patient Instructions for Blood Glucose Monitoring and Insulin/Medications Dosing Guide"  in media tab for additional information. Please  also refer to " Patient Self Inventory" in the Media  tab for reviewed elements of pertinent patient history.  Darci Current participated in the discussions, expressed understanding, and voiced agreement with the above plans.  All questions were answered to her satisfaction. she is encouraged to contact clinic should she have any questions or concerns prior to her return visit.   Follow up plan: Return in about 6 months (around 01/03/2019) for Follow up with Pre-visit Labs, Meter, and Logs.  Glade Lloyd, MD Phone:  [phone]  Fax: [phone]   07/04/2018, 2:19 PM

## 2018-07-04 NOTE — Patient Instructions (Signed)

## 2018-07-14 DIAGNOSIS — E7800 Pure hypercholesterolemia, unspecified: Secondary | ICD-10-CM | POA: Diagnosis not present

## 2018-07-14 DIAGNOSIS — I10 Essential (primary) hypertension: Secondary | ICD-10-CM | POA: Diagnosis not present

## 2018-07-14 DIAGNOSIS — E119 Type 2 diabetes mellitus without complications: Secondary | ICD-10-CM | POA: Diagnosis not present

## 2018-07-22 ENCOUNTER — Ambulatory Visit: Payer: Medicare Other | Admitting: Family Medicine

## 2018-08-07 ENCOUNTER — Other Ambulatory Visit: Payer: Self-pay | Admitting: "Endocrinology

## 2018-08-10 ENCOUNTER — Ambulatory Visit: Payer: Medicare Other | Admitting: Adult Health

## 2018-08-10 DIAGNOSIS — E119 Type 2 diabetes mellitus without complications: Secondary | ICD-10-CM | POA: Diagnosis not present

## 2018-08-10 DIAGNOSIS — E7800 Pure hypercholesterolemia, unspecified: Secondary | ICD-10-CM | POA: Diagnosis not present

## 2018-08-10 DIAGNOSIS — I10 Essential (primary) hypertension: Secondary | ICD-10-CM | POA: Diagnosis not present

## 2018-08-15 DIAGNOSIS — G35 Multiple sclerosis: Secondary | ICD-10-CM | POA: Diagnosis not present

## 2018-08-20 ENCOUNTER — Other Ambulatory Visit: Payer: Self-pay | Admitting: "Endocrinology

## 2018-08-20 ENCOUNTER — Other Ambulatory Visit: Payer: Self-pay | Admitting: Neurology

## 2018-08-20 DIAGNOSIS — E1122 Type 2 diabetes mellitus with diabetic chronic kidney disease: Secondary | ICD-10-CM

## 2018-09-02 DIAGNOSIS — E119 Type 2 diabetes mellitus without complications: Secondary | ICD-10-CM | POA: Diagnosis not present

## 2018-09-02 LAB — HM DIABETES EYE EXAM

## 2018-09-08 DIAGNOSIS — I10 Essential (primary) hypertension: Secondary | ICD-10-CM | POA: Diagnosis not present

## 2018-09-08 DIAGNOSIS — E119 Type 2 diabetes mellitus without complications: Secondary | ICD-10-CM | POA: Diagnosis not present

## 2018-09-08 DIAGNOSIS — E7800 Pure hypercholesterolemia, unspecified: Secondary | ICD-10-CM | POA: Diagnosis not present

## 2018-09-26 ENCOUNTER — Other Ambulatory Visit (HOSPITAL_COMMUNITY): Payer: Self-pay | Admitting: Internal Medicine

## 2018-09-26 DIAGNOSIS — E1165 Type 2 diabetes mellitus with hyperglycemia: Secondary | ICD-10-CM | POA: Diagnosis not present

## 2018-09-26 DIAGNOSIS — Z7189 Other specified counseling: Secondary | ICD-10-CM | POA: Diagnosis not present

## 2018-09-26 DIAGNOSIS — Z6839 Body mass index (BMI) 39.0-39.9, adult: Secondary | ICD-10-CM | POA: Diagnosis not present

## 2018-09-26 DIAGNOSIS — E039 Hypothyroidism, unspecified: Secondary | ICD-10-CM | POA: Diagnosis not present

## 2018-09-26 DIAGNOSIS — Z1211 Encounter for screening for malignant neoplasm of colon: Secondary | ICD-10-CM | POA: Diagnosis not present

## 2018-09-26 DIAGNOSIS — I10 Essential (primary) hypertension: Secondary | ICD-10-CM | POA: Diagnosis not present

## 2018-09-26 DIAGNOSIS — Z1231 Encounter for screening mammogram for malignant neoplasm of breast: Secondary | ICD-10-CM

## 2018-09-26 DIAGNOSIS — R5383 Other fatigue: Secondary | ICD-10-CM | POA: Diagnosis not present

## 2018-09-26 DIAGNOSIS — Z299 Encounter for prophylactic measures, unspecified: Secondary | ICD-10-CM | POA: Diagnosis not present

## 2018-09-26 DIAGNOSIS — Z1331 Encounter for screening for depression: Secondary | ICD-10-CM | POA: Diagnosis not present

## 2018-09-26 DIAGNOSIS — Z0000 Encounter for general adult medical examination without abnormal findings: Secondary | ICD-10-CM | POA: Diagnosis not present

## 2018-09-26 DIAGNOSIS — Z1339 Encounter for screening examination for other mental health and behavioral disorders: Secondary | ICD-10-CM | POA: Diagnosis not present

## 2018-09-27 DIAGNOSIS — E7800 Pure hypercholesterolemia, unspecified: Secondary | ICD-10-CM | POA: Diagnosis not present

## 2018-09-27 DIAGNOSIS — Z79899 Other long term (current) drug therapy: Secondary | ICD-10-CM | POA: Diagnosis not present

## 2018-09-27 DIAGNOSIS — E039 Hypothyroidism, unspecified: Secondary | ICD-10-CM | POA: Diagnosis not present

## 2018-09-27 DIAGNOSIS — R5383 Other fatigue: Secondary | ICD-10-CM | POA: Diagnosis not present

## 2018-09-28 ENCOUNTER — Ambulatory Visit (INDEPENDENT_AMBULATORY_CARE_PROVIDER_SITE_OTHER): Payer: Medicare Other | Admitting: Urology

## 2018-09-28 DIAGNOSIS — N3020 Other chronic cystitis without hematuria: Secondary | ICD-10-CM

## 2018-09-28 DIAGNOSIS — R339 Retention of urine, unspecified: Secondary | ICD-10-CM | POA: Diagnosis not present

## 2018-10-06 ENCOUNTER — Ambulatory Visit (HOSPITAL_COMMUNITY): Payer: Medicare Other

## 2018-10-07 ENCOUNTER — Inpatient Hospital Stay (HOSPITAL_COMMUNITY): Admission: RE | Admit: 2018-10-07 | Payer: Medicare Other | Source: Ambulatory Visit

## 2018-10-09 DIAGNOSIS — Z23 Encounter for immunization: Secondary | ICD-10-CM | POA: Diagnosis not present

## 2018-10-10 ENCOUNTER — Ambulatory Visit (HOSPITAL_COMMUNITY)
Admission: RE | Admit: 2018-10-10 | Discharge: 2018-10-10 | Disposition: A | Discharge status: Home / Self Care | Payer: Medicare Other | Source: Ambulatory Visit | Attending: Internal Medicine | Admitting: Internal Medicine

## 2018-10-10 ENCOUNTER — Other Ambulatory Visit: Payer: Self-pay

## 2018-10-10 DIAGNOSIS — Z1231 Encounter for screening mammogram for malignant neoplasm of breast: Secondary | ICD-10-CM | POA: Diagnosis not present

## 2018-10-13 ENCOUNTER — Encounter: Payer: Self-pay | Admitting: "Endocrinology

## 2018-10-14 ENCOUNTER — Other Ambulatory Visit (HOSPITAL_COMMUNITY): Payer: Self-pay | Admitting: Internal Medicine

## 2018-10-14 DIAGNOSIS — R928 Other abnormal and inconclusive findings on diagnostic imaging of breast: Secondary | ICD-10-CM

## 2018-10-18 ENCOUNTER — Ambulatory Visit (HOSPITAL_COMMUNITY): Admission: RE | Admit: 2018-10-18 | Payer: Medicare Other | Source: Ambulatory Visit

## 2018-10-18 ENCOUNTER — Other Ambulatory Visit: Payer: Self-pay

## 2018-10-18 ENCOUNTER — Ambulatory Visit (HOSPITAL_COMMUNITY)
Admission: RE | Admit: 2018-10-18 | Discharge: 2018-10-18 | Disposition: A | Discharge status: Home / Self Care | Payer: Medicare Other | Source: Ambulatory Visit | Attending: Internal Medicine | Admitting: Internal Medicine

## 2018-10-18 DIAGNOSIS — E7800 Pure hypercholesterolemia, unspecified: Secondary | ICD-10-CM | POA: Diagnosis not present

## 2018-10-18 DIAGNOSIS — E119 Type 2 diabetes mellitus without complications: Secondary | ICD-10-CM | POA: Diagnosis not present

## 2018-10-18 DIAGNOSIS — R921 Mammographic calcification found on diagnostic imaging of breast: Secondary | ICD-10-CM | POA: Diagnosis not present

## 2018-10-18 DIAGNOSIS — R928 Other abnormal and inconclusive findings on diagnostic imaging of breast: Secondary | ICD-10-CM | POA: Insufficient documentation

## 2018-10-18 DIAGNOSIS — I10 Essential (primary) hypertension: Secondary | ICD-10-CM | POA: Diagnosis not present

## 2018-11-18 DIAGNOSIS — E119 Type 2 diabetes mellitus without complications: Secondary | ICD-10-CM | POA: Diagnosis not present

## 2018-11-18 DIAGNOSIS — E7800 Pure hypercholesterolemia, unspecified: Secondary | ICD-10-CM | POA: Diagnosis not present

## 2018-11-18 DIAGNOSIS — I10 Essential (primary) hypertension: Secondary | ICD-10-CM | POA: Diagnosis not present

## 2018-11-27 ENCOUNTER — Other Ambulatory Visit: Payer: Self-pay | Admitting: Neurology

## 2018-11-28 ENCOUNTER — Other Ambulatory Visit: Payer: Self-pay | Admitting: "Endocrinology

## 2018-11-28 DIAGNOSIS — E1122 Type 2 diabetes mellitus with diabetic chronic kidney disease: Secondary | ICD-10-CM

## 2019-01-04 DIAGNOSIS — I10 Essential (primary) hypertension: Secondary | ICD-10-CM | POA: Diagnosis not present

## 2019-01-04 DIAGNOSIS — E119 Type 2 diabetes mellitus without complications: Secondary | ICD-10-CM | POA: Diagnosis not present

## 2019-01-04 DIAGNOSIS — E7800 Pure hypercholesterolemia, unspecified: Secondary | ICD-10-CM | POA: Diagnosis not present

## 2019-01-13 ENCOUNTER — Telehealth: Payer: Self-pay | Admitting: "Endocrinology

## 2019-01-13 DIAGNOSIS — E039 Hypothyroidism, unspecified: Secondary | ICD-10-CM

## 2019-01-13 DIAGNOSIS — E1122 Type 2 diabetes mellitus with diabetic chronic kidney disease: Secondary | ICD-10-CM

## 2019-01-13 DIAGNOSIS — Z794 Long term (current) use of insulin: Secondary | ICD-10-CM

## 2019-01-13 NOTE — Telephone Encounter (Signed)
Can you update lab order °

## 2019-01-16 NOTE — Telephone Encounter (Signed)
Lab order entered.

## 2019-01-17 ENCOUNTER — Telehealth: Payer: Self-pay | Admitting: Family Medicine

## 2019-01-17 ENCOUNTER — Ambulatory Visit (INDEPENDENT_AMBULATORY_CARE_PROVIDER_SITE_OTHER): Payer: Medicare Other | Admitting: Family Medicine

## 2019-01-17 ENCOUNTER — Encounter: Payer: Self-pay | Admitting: Family Medicine

## 2019-01-17 ENCOUNTER — Other Ambulatory Visit: Payer: Self-pay

## 2019-01-17 VITALS — BP 132/82 | HR 91 | Temp 97.6°F | Ht 64.0 in | Wt 230.6 lb

## 2019-01-17 DIAGNOSIS — G3184 Mild cognitive impairment, so stated: Secondary | ICD-10-CM

## 2019-01-17 DIAGNOSIS — R29898 Other symptoms and signs involving the musculoskeletal system: Secondary | ICD-10-CM

## 2019-01-17 DIAGNOSIS — R269 Unspecified abnormalities of gait and mobility: Secondary | ICD-10-CM

## 2019-01-17 DIAGNOSIS — G35 Multiple sclerosis: Secondary | ICD-10-CM | POA: Diagnosis not present

## 2019-01-17 DIAGNOSIS — R262 Difficulty in walking, not elsewhere classified: Secondary | ICD-10-CM

## 2019-01-17 LAB — COMPREHENSIVE METABOLIC PANEL
AG Ratio: 1.4 (calc) (ref 1.0–2.5)
ALT: 14 U/L (ref 6–29)
AST: 13 U/L (ref 10–35)
Albumin: 4.1 g/dL (ref 3.6–5.1)
Alkaline phosphatase (APISO): 83 U/L (ref 37–153)
BUN/Creatinine Ratio: 35 (calc) — ABNORMAL HIGH (ref 6–22)
BUN: 32 mg/dL — ABNORMAL HIGH (ref 7–25)
CO2: 27 mmol/L (ref 20–32)
Calcium: 9.4 mg/dL (ref 8.6–10.4)
Chloride: 103 mmol/L (ref 98–110)
Creat: 0.91 mg/dL (ref 0.50–0.99)
Globulin: 2.9 g/dL (calc) (ref 1.9–3.7)
Glucose, Bld: 108 mg/dL — ABNORMAL HIGH (ref 65–99)
Potassium: 4.6 mmol/L (ref 3.5–5.3)
Sodium: 142 mmol/L (ref 135–146)
Total Bilirubin: 0.4 mg/dL (ref 0.2–1.2)
Total Protein: 7 g/dL (ref 6.1–8.1)

## 2019-01-17 LAB — T4, FREE: Free T4: 1.2 ng/dL (ref 0.8–1.8)

## 2019-01-17 LAB — HEMOGLOBIN A1C
Hgb A1c MFr Bld: 6.8 % of total Hgb — ABNORMAL HIGH (ref <5.7)
Mean Plasma Glucose: 148 (calc)
eAG (mmol/L): 8.2 (calc)

## 2019-01-17 LAB — TSH: TSH: 2.27 mIU/L (ref 0.40–4.50)

## 2019-01-17 NOTE — Telephone Encounter (Signed)
I LMVM for pts sister, Pam, that order for [facility] placed and sent to [facility]. She is to call back if questions.

## 2019-01-17 NOTE — Progress Notes (Signed)
PATIENT: [name] DOB: August 22, 1955  REASON FOR VISIT: follow up HISTORY FROM: patient  Chief Complaint  Patient presents with  . Follow-up    6 mon f/u. Sister([name]) present. Rm 2. No new concerns at this time.      HISTORY OF PRESENT ILLNESS: Today 01/17/19 [name] is a 64 y.o. female here today for follow up for MS. She continues Ocrevus infusions. Last infusion in July. Next infusion is 1/18. MRI brain stable in 10/2016. She continues Aricept 5mg  daily. She is tolerating medication well with no obvious adverse effects. She is able to perform all ADL's. She lives independently. She does not drive. She has had waxing and waning weakness of right leg. Worse when she is tired. No pain, numbness or tingling. No falls. She does feel that she drags her leg at times. No changes in vision. No bowel or bladder changes. Mood is stable.   HISTORY: (copied from my note on 07/04/2018)  [name] is a 63 y.o. female here today for follow up of MS and mild cognitive impairment.  [name] reports that she is feeling well.  Last Ocrevus infusion was in January.  She is scheduled for her next infusion in July.  She denies any new numbness.  No changes in symptoms.  No falls.  She feels that her cognition is fairly stable on Aricept.  She does not drive.  She does live alone.  She is able to dress and bathe herself.  She is tolerating medications well.  She has no concerns today.   HISTORY (copied from August note on 01/13/2018)  UPDATE12/19/2019CMMs. 01/15/2018, 63 year old female returns for follow-up with relapsing remitting multiple sclerosis and mild cognitive impairment. She is currently on Aricept and she feels her memory is stable. She is on 67 6 months for her multiple sclerosis. Her next infusion is January 21,2020.MRI of the brain 11/23/2016 with and without contrast shows the following: 1. Multiple T2/FLAIR hyperintense foci in the periventricular,  juxtacortical and deep white matter of the hemispheres and a couple foci in the infratentorial brain in a pattern and configuration consistent with chronic demyelinating plaque associated with multiple sclerosis. None of the foci appears to be acute. When compared to the MRI dated 09/15/2014, there is no definite interval change. 2. There are no acute findings and there is a normal enhancement pattern She is ambulating with a rolling walker. She has had one fall in 6 months no injury. She returns for reevaluation with no new concerns.  UPDATE 5/3/2019CM Ms. 07/28/2017, 63 year old female returns for follow-up with history of multiple sclerosis and mild cognitive impairment. She was placed on Aricept by Dr. 46 and patient and sister has seen improvement. MMSE today 29 out of 30 in addition she has been placed on Ocrevus IV infusion for her multiple sclerosis. Her next infusion is July 22nd. She continues to use her rolling walker for ambulation. She has not had any falls. She returns for reevaluation without any further neurologic complaints.   Interval history from 23 December 2016, I am seeing [name] here today long-established patient with MS and recent cognitive decline, in the presence of her sister [name], [name]. In our last visit 6 weeks ago we had discussed to start endocrine fluids for the treatment of MS which helps her to be compliant. The first infusion is scheduled for December 27 th.In preparation we had obtained a blood test for tuberculosis was returned negative, negative hepatitis panel,  comprehensive metabolicpanel and CBC.Her creatinine is a slight bit elevated, but her glomerular filtration rate was 59 -which is still considered normal.  She also had high blood sugars. White and red blood cell counts were normal region.There was no anemia.  The patient's MRI from 23 November 2016 showed multiple T2 and FLAIR hyperintense foci in the pattern and  configuration consistent with demyelinating plaques. This is a chronic multiple sclerosis as none of the foci appear to be acute. I consider the patient a good candidate for OCREVUS. I was pleasantly surprised to see the patient looking brighter, more alert and certainly overall healthy.today . She has eaten more regularly and more fresh food, she also has more social interaction leaves the house more often. Appears more alert. She is able to walk with her 4 wheeled walker. Mrs [name] Hanson that Aricept has had a positive effect on her sister. We will repeat a MOCA/ MMSE in 3 month , next visit with NP  10-15-2018[name] a 63 y.o.femaleIs seen here as a revisit from Dr. Loleta Hanson multiple sclerosis follow up.  I had the pleasure of seeing [name] today in the presence of her sister, physician assistant [name]. I know [name] from the [facility] radiology department where she has worked with the interventional team. I last saw [name] in late 2017 when our goal was to change her to a medication of infusion character, namely OCREVUS. At the time she had an MRI with out contrast that confirmed that and as was her diagnosis, but could not 100% differentiate between acute and more remote lesions. Now, almost a year later when it again as [name] had missed some of her appointments. From now on she will bring her sister with her. My goal is still to change the patient to a therapy that doesn't need to daily or every other day compliance neither pill no injection but rather an infusion therapy here in office. Low compliance would therefore not be as impairing. I would still like to change her to OCREVUS- but will have to repeat the hepatitis panel, the gold tuberculosis test and the patient information form. She is not driving any longer but lives alone- close to her sister. CD   The patient had followed Dr. Orlin Hanson for over 20 years , diagnosed since 1989 ,, and is now mainly  seen by Margaret Angel, NP. She has never been on another medication, but Betaseron. In my last visit , 2013 , I suggested Ampyra to her , which we than initiated but had to D/C after her kidney function changed.   The patient is diabetic, diagnosed in 2008. She just changed in 2014 to insulin.  She remains on Betaseron. Has been gaining weight and has had a fall last December. Uses a Rollator when she is outdoors but indoors a walking stick or cane, but more and more frequently the walker. .  She believes her sleep is poor due to spasms and nocturia, she may snore. Still wakes with a dry mouth, has been told she snores. Sleeps prone. The patient underwent a sleep study I believe in 2014, but was unable to tolerate the CPAP due to her sleeping habit. She has a chief complaint today of cramping pain in the left leg, spasm, at the groin and right above the hip. No radiation , no burning and no deep ache - just spams. Onset 3 weeks ago. Although, she has taken baclofen po now tid for a long time ,  for the " MS hug" - it had an effect only on abdominal spasms. On the same dose, it has not affected the hip spasms.  She believes her sleep is poor due to spasms and nocturia, she may snore. Wakes with a dry mouth, has been told she snores. Sleeps prone.  The spasms in the left hip are preceded by sitting in certain low chairs in [employer] , her volunteer work place. Dr. Woody Seller recently did some routine Labs, that are not available on EPIC.    REVIEW OF SYSTEMS: Out of a complete 14 system review of symptoms, the patient complains only of the following symptoms, right leg weakness and all other reviewed systems are negative.  ALLERGIES: Allergies  Allergen Reactions  . Penicillins Anaphylaxis    Has patient had a PCN reaction causing immediate rash, facial/tongue/throat swelling, SOB or lightheadedness with hypotension: Yes Has patient had a PCN reaction causing severe rash involving mucus membranes  or skin necrosis: No Has patient had a PCN reaction that required hospitalization No Has patient had a PCN reaction occurring within the last 10 years: No If all of the above answers are "NO", then may proceed with Cephalosporin use.   Marland Kitchen Lotensin [Benazepril Hcl] Cough    HOME MEDICATIONS: Outpatient Medications Prior to Visit  Medication Sig Dispense Refill  . aspirin 81 MG tablet Take 81 mg by mouth daily.    Marland Kitchen atorvastatin (LIPITOR) 40 MG tablet Take 40 mg by mouth daily.     . B-D ULTRAFINE III SHORT PEN 31G X 8 MM MISC USE 1 PEN NEEDLE TWICE A DAY 180 each 4  . baclofen (LIORESAL) 10 MG tablet TAKE 1 TABLET (10 MG) IN THE MORNING AND 2 TABLETS (20 MG) IN THE EVENING 270 tablet 3  . Calcium Carbonate (CALCIUM 600 PO) Take 1 tablet by mouth daily.    Marland Kitchen donepezil (ARICEPT) 5 MG tablet Take 1 tablet (5 mg total) by mouth at bedtime. 90 tablet 3  . Ergocalciferol 2000 units CAPS Take 2,000 capsules by mouth daily.     Marland Kitchen glucose blood (ONE TOUCH ULTRA TEST) test strip AS DIRECTED TO TEST BLOOD SUGAR FOUR TIMES DAILY 350 each 2  . glucose blood (ONE TOUCH ULTRA TEST) test strip Use as instructed 2 x daily. E11.65 One touch Ultra 200 each 5  . imipramine (TOFRANIL) 25 MG tablet TAKE 1 TABLET AT BEDTIME 90 tablet 3  . Insulin Glargine (LANTUS SOLOSTAR) 100 UNIT/ML Solostar Pen INJECT 60 UNITS UNDER THE SKIN DAILY WITH BREAKFAST 60 mL 0  . levothyroxine (SYNTHROID, LEVOTHROID) 25 MCG tablet Take 75 mcg daily before breakfast by mouth.    . metFORMIN (GLUCOPHAGE-XR) 500 MG 24 hr tablet TAKE 1 TABLET TWICE A DAY 180 tablet 3  . methenamine (HIPREX) 1 g tablet 2 (two) times daily with a meal.     . RELION PEN NEEDLES 32G X 4 MM MISC     . Tamsulosin HCl (FLOMAX) 0.4 MG CAPS Take 0.4 mg by mouth daily after breakfast.     . tolterodine (DETROL LA) 4 MG 24 hr capsule TAKE 1 CAPSULE DAILY 90 capsule 3  . TOPROL XL 25 MG 24 hr tablet Take 25 mg by mouth daily.     . TRULICITY 1.61 WR/6.0AV SOPN  INJECT 0.5 ML (0.75 MG) UNDER THE SKIN ONCE WEEKLY 6 mL 3   No facility-administered medications prior to visit.    PAST MEDICAL HISTORY: Past Medical History:  Diagnosis Date  . Diabetes  mellitus without complication (HCC)   . High cholesterol   . Hypertension   . Hypothyroidism   . Morbid obesity (HCC)   . MS (multiple sclerosis) (HCC) relapsing remitting  . Severe obesity (BMI >= 40) (HCC) 10/11/2012    PAST SURGICAL HISTORY: Past Surgical History:  Procedure Laterality Date  . BIOPSY  03/19/2015   Procedure: BIOPSY;  Surgeon: West BaliSandi L Fields, MD;  Location: [facility];  Service: Endoscopy;;  random colon biopsy  . CHOLECYSTECTOMY  8/09  . COLONOSCOPY     about 7 years  . COLONOSCOPY WITH PROPOFOL N/A 03/19/2015   [id number]:normal ileum and colon/small internal hemorrhoids  . LEFT OOPHORECTOMY Left 8/11    FAMILY HISTORY: Family History  Problem Relation Age of Onset  . Heart attack Mother   . Diabetes Mother   . Aneurysm Father   . Heart disease Father   . Cancer Maternal Grandmother        breast  . Colon cancer Neg Hx     SOCIAL HISTORY: Social History   Socioeconomic History  . Marital status: Single    Spouse name: Not on file  . Number of children: 0  . Years of education: college  . Highest education level: Not on file  Occupational History  . 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 Smoking status: Never Smoker  . Smokeless tobacco: Never Used  Substance and Sexual Activity  . Alcohol use: No    Alcohol/week: 0.0 standard drinks    Comment: quit in 1975  . Drug use: No  . Sexual activity: Never    Birth control/protection: Post-menopausal  Other Topics Concern  . Not on file  Social History Narrative   Patient is single and lives alone.   Patient is right-handed.   Patient has a college education.   Patient does not drink any caffeine.   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   Social Determinants of  Health   Financial Resource Strain:   . Difficulty of Paying Living Expenses: Not on file  Food Insecurity:   . Worried About Programme researcher, broadcasting/film/videounning Out of Food in the Last Year: Not on file  . Ran Out of Food in the Last Year: Not on file  Transportation Needs:   . Lack of Transportation (Medical): Not on file  . Lack of Transportation (Non-Medical): Not on file  Physical Activity:   . Days of Exercise per Week: Not on file  . Minutes of Exercise per Session: Not on file  Stress:   . Feeling of Stress : Not on file  Social Connections:   . Frequency of Communication with Friends and Family: Not on file  . Frequency of Social Gatherings with Friends and Family: Not on file  . Attends Religious Services: Not on file  . Active Member of Clubs or Organizations: Not on file  . Attends BankerClub or Organization Meetings: Not on file  . Marital Status: Not on file  Intimate Partner Violence:   . Fear of Current or Ex-Partner: Not on file  . Emotionally Abused: Not on file  . Physically Abused: Not on file  . Sexually Abused: Not on file      PHYSICAL EXAM  Vitals:   01/17/19 1035  BP: 132/82  Pulse: 91  Temp: 97.6 F (36.4 C)  TempSrc: Oral  Weight: 230 lb 9.6 oz (104.6 kg)  Height: 5\' 4"  (1.626 m)   Body mass index is 39.58 kg/m.  Generalized: Well developed, in no acute distress  Respiratory: clear to auscultation bilaterally  Cardiology: normal rate and rhythm, no murmur noted Neurological examination  Mentation: Alert oriented to time, place, history taking. Follows all commands speech and language fluent Cranial nerve II-XII: Pupils were equal round reactive to light. Extraocular movements were full, visual field were full on confrontational test. Facial sensation and strength were normal. Uvula tongue midline. Head turning and shoulder shrug  were normal and symmetric. Motor: The motor testing reveals 5 over 5 strength of bilateral upper and left lower extremity. 4/5 right lower hip  flexion. Good symmetric motor tone is noted throughout.  Sensory: Sensory testing is intact to soft touch on all 4 extremities. No evidence of extinction is noted.  Coordination: Cerebellar testing reveals good finger-nose-finger and heel-to-shin bilaterally.  Gait and station: Gait is stable with Rolator. Tandem gait not assessed today. Romberg is negative. No drift is seen.  Reflexes: Deep tendon reflexes are symmetric and normal bilaterally.   DIAGNOSTIC DATA (LABS, IMAGING, TESTING) - I reviewed patient records, labs, notes, testing and imaging myself where available.  MMSE - Mini Mental State Exam 01/13/2018 06/17/2017  Orientation to time 4 4  Orientation to Place 5 5  Registration 3 3  Attention/ Calculation 5 5  Recall 3 3  Language- name 2 objects 2 2  Language- repeat 1 1  Language- follow 3 step command 3 3  Language- read & follow direction 1 1  Write a sentence 0 0  Write a sentence-comments no subject sentence didn't have subject  Copy design 1 1  Total score 28 28     Lab Results  Component Value Date   WBC 6.6 11/09/2016   HGB 11.8 11/09/2016   HCT 35.8 11/09/2016   MCV 81 11/09/2016   PLT 367 11/09/2016      Component Value Date/Time   NA 142 01/16/2019 1159   NA 141 11/09/2016 1438   K 4.6 01/16/2019 1159   CL 103 01/16/2019 1159   CO2 27 01/16/2019 1159   GLUCOSE 108 (H) 01/16/2019 1159   BUN 32 (H) 01/16/2019 1159   BUN 32 (A) 09/20/2017 0000   CREATININE 0.91 01/16/2019 1159   CALCIUM 9.4 01/16/2019 1159   PROT 7.0 01/16/2019 1159   PROT 7.2 11/09/2016 1438   ALBUMIN 3.8 11/09/2016 1438   AST 13 01/16/2019 1159   ALT 14 01/16/2019 1159   ALKPHOS 115 11/09/2016 1438   BILITOT 0.4 01/16/2019 1159   BILITOT 0.2 11/09/2016 1438   GFRNONAA 73 06/21/2018 1142   GFRAA 85 06/21/2018 1142   Lab Results  Component Value Date   CHOL 171 09/20/2017   HDL 57 09/20/2017   LDLCALC 91 09/20/2017   TRIG 115 09/20/2017   CHOLHDL 2.7 03/17/2016   Lab  Results  Component Value Date   HGBA1C 6.8 (H) 01/16/2019   No results found for: VITAMINB12 Lab Results  Component Value Date   TSH 2.27 01/16/2019    ASSESSMENT AND PLAN 63 y.o. year old female  has a past medical history of Diabetes mellitus without complication (HCC), High cholesterol, Hypertension, Hypothyroidism, Morbid obesity (HCC), MS (multiple sclerosis) (HCC) (relapsing remitting), and Severe obesity (BMI >= 40) (HCC) (10/11/2012). here with     ICD-10-CM   1. Multiple sclerosis, relapsing-remitting (HCC)  G35 CBC with Differential/Platelets    MR BRAIN W WO CONTRAST    Ambulatory referral to Physical Therapy  2. Transient right leg weakness  R29.898 Ambulatory referral to Physical  Therapy  3. Mild cognitive impairment  G31.84   4. Abnormality of gait  R26.9     Overall, Kes is doing very well.  She continues Ocrevus infusions and is tolerating well.  She denies any exacerbating MS symptoms.  She does continue to have waxing and waning right lower extremity weakness.  She feels that she is dragging her right leg at times when she is really tired.  She has participated in physical therapy in the past.  I will reorder physical therapy for leg strengthening.  We will update lab work today. A1c and chemistry checked yesterday by PCP.  She continues close follow-up with primary care for comorbidities. We will order an MRI of the brain for MS monitoring.  She will follow-up in 6 months, sooner if needed.  She verbalizes understanding and agreement with this plan.   Orders Placed This Encounter  Procedures  . MR BRAIN W WO CONTRAST    Standing Status:   Future    Standing Expiration Date:   03/19/2020    Order Specific Question:   If indicated for the ordered procedure, I authorize the administration of contrast media per Radiology protocol    Answer:   Yes    Order Specific Question:   What is the patient's sedation requirement?    Answer:   No Sedation    Order Specific  Question:   Does the patient have a pacemaker or implanted devices?    Answer:   No    Order Specific Question:   Radiology Contrast Protocol - do NOT remove file path    Answer:   [email or web address]    Order Specific Question:   Preferred imaging location?    Answer:   Internal  . CBC with Differential/Platelets  . Ambulatory referral to Physical Therapy    Referral Priority:   Routine    Referral Type:   Physical Medicine    Referral Reason:   Specialty Services Required    Requested Specialty:   Physical Therapy    Number of Visits Requested:   1     No orders of the defined types were placed in this encounter.     Shawnie Dapper, FNP-C 01/17/2019, 11:19 AM [facility] [address] [place], [place] [address] [phone]

## 2019-01-17 NOTE — Telephone Encounter (Signed)
Order placed for HHPT eval

## 2019-01-17 NOTE — Patient Instructions (Signed)
We will continue current treatment plan  PT for right leg weakness, Fall precautions, continue using [employer]   Follow up in 6 months, sooner if needed    Memory Compensation Strategies  1. Use "WARM" strategy.  W= write it down  A= associate it  R= repeat it  M= make a mental note  2.   You can keep a Social worker.  Use a 3-ring notebook with sections for the following: calendar, important names and phone numbers,  medications, doctors' names/phone numbers, lists/reminders, and a section to journal what you did  each day.   3.    Use a calendar to write appointments down.  4.    Write yourself a schedule for the day.  This can be placed on the calendar or in a separate section of the Memory Notebook.  Keeping a  regular schedule can help memory.  5.    Use medication organizer with sections for each day or morning/evening pills.  You may need help loading it  6.    Keep a basket, or pegboard by the door.  Place items that you need to take out with you in the basket or on the pegboard.  You may also want to  include a message board for reminders.  7.    Use sticky notes.  Place sticky notes with reminders in a place where the task is performed.  For example: " turn off the  stove" placed by the stove, "lock the door" placed on the door at eye level, " take your medications" on  the bathroom mirror or by the place where you normally take your medications.  8.    Use alarms/timers.  Use while cooking to remind yourself to check on food or as a reminder to take your medicine, or as a  reminder to make a call, or as a reminder to perform another task, etc.   Multiple Sclerosis Multiple sclerosis (MS) is a disease of the brain, spinal cord, and optic nerves (central nervous system). It causes the body's disease-fighting (immune) system to destroy the protective covering (myelin sheath) around nerves in the brain. When this happens, signals (nerve impulses) going to and from the  brain and spinal cord do not get sent properly or may not get sent at all. There are several types of MS:  Relapsing-remitting MS. This is the most common type. This causes sudden attacks of symptoms. After an attack, you may recover completely until the next attack, or some symptoms may remain permanently.  Secondary progressive MS. This usually develops after the onset of relapsing-remitting MS. Similar to relapsing-remitting MS, this type also causes sudden attacks of symptoms. Attacks may be less frequent, but symptoms slowly get worse (progress) over time.  Primary progressive MS. This causes symptoms that steadily progress over time. This type of MS does not cause sudden attacks of symptoms. The age of onset of MS varies, but it often develops between 68-20 years of age. MS is a lifelong (chronic) condition. There is no cure, but treatment can help slow down the progression of the disease. What are the causes? The cause of this condition is not known. What increases the risk? You are more likely to develop this condition if:  You are a woman.  You have a relative with MS. However, the condition is not passed from parent to child (inherited).  You have a lack (deficiency) of vitamin D.  You smoke. MS is more common in the [place] than  in the [place]. What are the signs or symptoms? Relapsing-remitting and secondary progressive MS cause symptoms to occur in episodes or attacks that may last weeks to months. There may be long periods between attacks in which there are almost no symptoms. Primary progressive MS causes symptoms to steadily progress after they develop. Symptoms of MS vary because of the many different ways it affects the central nervous system. The main symptoms include:  Vision problems and eye pain.  Numbness.  Weakness.  Inability to move your arms, hands, feet, or legs (paralysis).  Balance problems.  Shaking that you cannot  control (tremors).  Muscle spasms.  Problems with thinking (cognitive changes). MS can also cause symptoms that are associated with the disease, but are not always the direct result of an MS attack. They may include:  Inability to control urination or bowel movements (incontinence).  Headaches.  Fatigue.  Inability to tolerate heat.  Emotional changes.  Depression.  Pain. How is this diagnosed? This condition is diagnosed based on:  Your symptoms.  A neurological exam. This involves checking central nervous system function, such as nerve function, reflexes, and coordination.  MRIs of the brain and spinal cord.  Lab tests, including a lumbar puncture that tests the fluid that surrounds the brain and spinal cord (cerebrospinal fluid).  Tests to measure the electrical activity of the brain in response to stimulation (evoked potentials). How is this treated? There is no cure for MS, but medicines can help decrease the number and frequency of attacks and help relieve nuisance symptoms. Treatment options may include:  Medicines that reduce the frequency of attacks. These medicines may be given by injection, by mouth (orally), or through an IV.  Medicines that reduce inflammation (steroids). These may provide short-term relief of symptoms.  Medicines to help control pain, depression, fatigue, or incontinence.  Vitamin D, if you have a deficiency.  Using devices to help you move around (assistive devices), such as braces, a cane, or a walker.  Physical therapy to strengthen and stretch your muscles.  Occupational therapy to help you with everyday tasks.  Alternative or complementary treatments such as exercise, massage, or acupuncture. Follow these instructions at home:  Take over-the-counter and prescription medicines only as told by your health care provider.  Do not drive or use heavy machinery while taking prescription pain medicine.  Use assistive devices as  recommended by your physical therapist or your health care provider.  Exercise as directed by your health care provider.  Return to your normal activities as told by your health care provider. Ask your health care provider what activities are safe for you.  Reach out for support. Share your feelings with friends, family, or a support group.  Keep all follow-up visits as told by your health care provider and therapists. This is important. Where to find more information  National Multiple Sclerosis Society: https://www.nationalmssociety.org Contact a health care provider if:  You feel depressed.  You develop new pain or numbness.  You have tremors.  You have problems with sexual function. Get help right away if:  You develop paralysis.  You develop numbness.  You have problems with your bladder or bowel function.  You develop double vision.  You lose vision in one or both eyes.  You develop suicidal thoughts.  You develop severe confusion. If you ever feel like you may hurt yourself or others, or have thoughts about taking your own life, get help right away. You can go to your nearest emergency  department or call:  Your local emergency services (911 in the [place]  A suicide crisis helpline, such as the National Suicide Prevention Lifeline at 707-555-76941-(331)574-5957. This is open 24 hours a day. Summary  Multiple sclerosis (MS) is a disease of the central nervous system that causes the body's immune system to destroy the protective covering (myelin sheath) around nerves in the brain.  There are 3 types of MS: relapsing-remitting, secondary progressive, and primary progressive. Relapsing-remitting and secondary progressive MS cause symptoms to occur in episodes or attacks that may last weeks to months. Primary progressive MS causes symptoms to steadily progress after they develop.  There is no cure for MS, but medicines can help decrease the number and frequency of attacks and help  relieve nuisance symptoms. Treatment may also include physical or occupational therapy.  If you develop numbness, paralysis, vision problems, or other neurological symptoms, get help right away. This information is not intended to replace advice given to you by your health care provider. Make sure you discuss any questions you have with your health care provider. Document Released: 01/10/2000 Document Revised: 12/25/2016 Document Reviewed: 03/23/2016 Elsevier Patient Education  2020 [employer]   Fall Prevention in the Home, Adult Falls can cause injuries. They can happen to people of all ages. There are many things you can do to make your home safe and to help prevent falls. Ask for help when making these changes, if needed. What actions can I take to prevent falls? General Instructions  Use good lighting in all rooms. Replace any light bulbs that burn out.  Turn on the lights when you go into a dark area. Use night-lights.  Keep items that you use often in easy-to-reach places. Lower the shelves around your home if necessary.  Set up your furniture so you have a clear path. Avoid moving your furniture around.  Do not have throw rugs and other things on the floor that can make you trip.  Avoid walking on wet floors.  If any of your floors are uneven, fix them.  Add color or contrast paint or tape to clearly mark and help you see: ? Any grab bars or handrails. ? First and last steps of stairways. ? Where the edge of each step is.  If you use a stepladder: ? Make sure that it is fully opened. Do not climb a closed stepladder. ? Make sure that both sides of the stepladder are locked into place. ? Ask someone to hold the stepladder for you while you use it.  If there are any pets around you, be aware of where they are. What can I do in the bathroom?      Keep the floor dry. Clean up any water that spills onto the floor as soon as it happens.  Remove soap buildup in the  tub or shower regularly.  Use non-skid mats or decals on the floor of the tub or shower.  Attach bath mats securely with double-sided, non-slip rug tape.  If you need to sit down in the shower, use a plastic, non-slip stool.  Install grab bars by the toilet and in the tub and shower. Do not use towel bars as grab bars. What can I do in the bedroom?  Make sure that you have a light by your bed that is easy to reach.  Do not use any sheets or blankets that are too big for your bed. They should not hang down onto the floor.  Have a firm chair that  has side arms. You can use this for support while you get dressed. What can I do in the kitchen?  Clean up any spills right away.  If you need to reach something above you, use a strong step stool that has a grab bar.  Keep electrical cords out of the way.  Do not use floor polish or wax that makes floors slippery. If you must use wax, use non-skid floor wax. What can I do with my stairs?  Do not leave any items on the stairs.  Make sure that you have a light switch at the top of the stairs and the bottom of the stairs. If you do not have them, ask someone to add them for you.  Make sure that there are handrails on both sides of the stairs, and use them. Fix handrails that are broken or loose. Make sure that handrails are as long as the stairways.  Install non-slip stair treads on all stairs in your home.  Avoid having throw rugs at the top or bottom of the stairs. If you do have throw rugs, attach them to the floor with carpet tape.  Choose a carpet that does not hide the edge of the steps on the stairway.  Check any carpeting to make sure that it is firmly attached to the stairs. Fix any carpet that is loose or worn. What can I do on the outside of my home?  Use bright outdoor lighting.  Regularly fix the edges of walkways and driveways and fix any cracks.  Remove anything that might make you trip as you walk through a door,  such as a raised step or threshold.  Trim any bushes or trees on the path to your home.  Regularly check to see if handrails are loose or broken. Make sure that both sides of any steps have handrails.  Install guardrails along the edges of any raised decks and porches.  Clear walking paths of anything that might make someone trip, such as tools or rocks.  Have any leaves, snow, or ice cleared regularly.  Use sand or salt on walking paths during winter.  Clean up any spills in your garage right away. This includes grease or oil spills. What other actions can I take?  Wear shoes that: ? Have a low heel. Do not wear high heels. ? Have rubber bottoms. ? Are comfortable and fit you well. ? Are closed at the toe. Do not wear open-toe sandals.  Use tools that help you move around (mobility aids) if they are needed. These include: ? Canes. ? Walkers. ? Scooters. ? Crutches.  Review your medicines with your doctor. Some medicines can make you feel dizzy. This can increase your chance of falling. Ask your doctor what other things you can do to help prevent falls. Where to find more information  Centers for Disease Control and Prevention, STEADI: HealthcareCounselor.com.pthttps://cdc.gov  [employer] A[employer]: RingConnections.sihttps://go4life.nia.nih.gov Contact a doctor if:  You are afraid of falling at home.  You feel weak, drowsy, or dizzy at home.  You fall at home. Summary  There are many simple things that you can do to make your home safe and to help prevent falls.  Ways to make your home safe include removing tripping hazards and installing grab bars in the bathroom.  Ask for help when making these changes in your home. This information is not intended to replace advice given to you by your health care provider. Make sure you discuss any questions you  have with your health care provider. Document Released: 11/08/2008 Document Revised: 05/05/2018 Document Reviewed: 08/27/2016 Elsevier Patient Education   2020 [employer].

## 2019-01-17 NOTE — Telephone Encounter (Signed)
Pam, sister of pt called.  Pt just seen. PT ordered for pt at outpt PT.  Sister stated that pt does not drive and she (herself) works and would not be able to drive pt to her appts.  Can this be set up as a HHA (she lives in [place], [place].  I relayed that due to her mobility issues she most likely would be able to qualify for HHPT. (they would check coverage prior to coming out I believe).  Would you be ok to change this to HHA?

## 2019-01-18 ENCOUNTER — Telehealth: Payer: Self-pay | Admitting: Family Medicine

## 2019-01-18 ENCOUNTER — Ambulatory Visit (INDEPENDENT_AMBULATORY_CARE_PROVIDER_SITE_OTHER): Payer: Medicare Other | Admitting: "Endocrinology

## 2019-01-18 ENCOUNTER — Telehealth: Payer: Self-pay | Admitting: *Deleted

## 2019-01-18 ENCOUNTER — Encounter: Payer: Self-pay | Admitting: "Endocrinology

## 2019-01-18 DIAGNOSIS — G3184 Mild cognitive impairment, so stated: Secondary | ICD-10-CM | POA: Diagnosis not present

## 2019-01-18 DIAGNOSIS — N182 Chronic kidney disease, stage 2 (mild): Secondary | ICD-10-CM

## 2019-01-18 DIAGNOSIS — Z6839 Body mass index (BMI) 39.0-39.9, adult: Secondary | ICD-10-CM | POA: Diagnosis not present

## 2019-01-18 DIAGNOSIS — E1122 Type 2 diabetes mellitus with diabetic chronic kidney disease: Secondary | ICD-10-CM

## 2019-01-18 DIAGNOSIS — E119 Type 2 diabetes mellitus without complications: Secondary | ICD-10-CM | POA: Diagnosis not present

## 2019-01-18 DIAGNOSIS — Z794 Long term (current) use of insulin: Secondary | ICD-10-CM | POA: Diagnosis not present

## 2019-01-18 DIAGNOSIS — I10 Essential (primary) hypertension: Secondary | ICD-10-CM

## 2019-01-18 DIAGNOSIS — E782 Mixed hyperlipidemia: Secondary | ICD-10-CM

## 2019-01-18 DIAGNOSIS — E039 Hypothyroidism, unspecified: Secondary | ICD-10-CM | POA: Diagnosis not present

## 2019-01-18 DIAGNOSIS — Z7982 Long term (current) use of aspirin: Secondary | ICD-10-CM | POA: Diagnosis not present

## 2019-01-18 DIAGNOSIS — E7800 Pure hypercholesterolemia, unspecified: Secondary | ICD-10-CM | POA: Diagnosis not present

## 2019-01-18 DIAGNOSIS — Z79899 Other long term (current) drug therapy: Secondary | ICD-10-CM | POA: Diagnosis not present

## 2019-01-18 DIAGNOSIS — G35 Multiple sclerosis: Secondary | ICD-10-CM | POA: Diagnosis not present

## 2019-01-18 DIAGNOSIS — R2689 Other abnormalities of gait and mobility: Secondary | ICD-10-CM | POA: Diagnosis not present

## 2019-01-18 LAB — CBC WITH DIFFERENTIAL/PLATELET
Basophils Absolute: 0.1 10*3/uL (ref 0.0–0.2)
Basos: 1 %
EOS (ABSOLUTE): 0.2 10*3/uL (ref 0.0–0.4)
Eos: 3 %
Hematocrit: 40.9 % (ref 34.0–46.6)
Hemoglobin: 13.4 g/dL (ref 11.1–15.9)
Immature Grans (Abs): 0 10*3/uL (ref 0.0–0.1)
Immature Granulocytes: 0 %
Lymphocytes Absolute: 1.2 10*3/uL (ref 0.7–3.1)
Lymphs: 14 %
MCH: 27.3 pg (ref 26.6–33.0)
MCHC: 32.8 g/dL (ref 31.5–35.7)
MCV: 83 fL (ref 79–97)
Monocytes Absolute: 0.7 10*3/uL (ref 0.1–0.9)
Monocytes: 8 %
Neutrophils Absolute: 6.3 10*3/uL (ref 1.4–7.0)
Neutrophils: 74 %
Platelets: 384 10*3/uL (ref 150–450)
RBC: 4.91 x10E6/uL (ref 3.77–5.28)
RDW: 13.1 % (ref 11.7–15.4)
WBC: 8.4 10*3/uL (ref 3.4–10.8)

## 2019-01-18 NOTE — Telephone Encounter (Signed)
I am ok with recommendation.

## 2019-01-18 NOTE — Telephone Encounter (Signed)
-----   Message from Debbora Presto, NP sent at 01/18/2019  8:41 AM EST ----- Labs normal

## 2019-01-18 NOTE — Telephone Encounter (Signed)
[name] with [facility] called for verbal orders:  Frequency: 1 w 1 2 w 4 1 w 4  Would like approval for a speech therapy evaluation for cognition   Please follow up

## 2019-01-18 NOTE — Telephone Encounter (Signed)
[employer] and approval given for both.  She verbalized understanding.

## 2019-01-18 NOTE — Telephone Encounter (Signed)
Spoke to [employer], sister of pt. Relayed that labs normal.  She will relay to pt.

## 2019-01-18 NOTE — Progress Notes (Signed)
01/18/2019                                Endocrinology Telehealth Visit Follow up Note -During COVID -19 Pandemic  I connected with [name] on 01/18/2019   by telephone and verified that I am speaking with the correct person using two identifiers. [name], [birth year]. she has verbally consented to this visit.  She was assisted by her sister,  [name] , during this visit. All issues noted in this document were discussed and addressed. The format was not optimal for physical exam.   Subjective:    Patient ID: [name], female    DOB: 02-19-1955,  Margaret Specking, MD   Past Medical History:  Diagnosis Date  . Diabetes mellitus without complication (HCC)   . High cholesterol   . Hypertension   . Hypothyroidism   . Morbid obesity (HCC)   . MS (multiple sclerosis) (HCC) relapsing remitting  . Severe obesity (BMI >= 40) (HCC) 10/11/2012   Past Surgical History:  Procedure Laterality Date  . BIOPSY  03/19/2015   Procedure: BIOPSY;  Surgeon: West Bali, MD;  Location: [facility];  Service: Endoscopy;;  random colon biopsy  . CHOLECYSTECTOMY  8/09  . COLONOSCOPY     about 7 years  . COLONOSCOPY WITH PROPOFOL N/A 03/19/2015   TGY:[facility] ileum and colon/small internal hemorrhoids  . LEFT OOPHORECTOMY Left 8/11   Social History   Socioeconomic History  . Marital status: Single    Spouse name: Not on file  . Number of children: 0  . Years of education: college  . Highest education level: Not on file  Occupational History  . Occupation: disabled    Associate Professor: NOT EMPLOYED  Tobacco Use  . Smoking status: Never Smoker  . Smokeless tobacco: Never Used  Substance and Sexual Activity  . Alcohol use: No    Alcohol/week: 0.0 standard drinks    Comment: quit in 1975  . Drug use: No  . Sexual activity: Never    Birth control/protection: Post-menopausal  Other Topics Concern  . Not on file  Social History Narrative   Patient is single and lives alone.   Patient is  right-handed.   Patient has a college education.   Patient does not drink any caffeine.   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   Social Determinants of Health   Financial Resource Strain:   . Difficulty of Paying Living Expenses: Not on file  Food Insecurity:   . Worried About Programme researcher, broadcasting/film/video in the Last Year: Not on file  . Ran Out of Food in the Last Year: Not on file  Transportation Needs:   . Lack of Transportation (Medical): Not on file  . Lack of Transportation (Non-Medical): Not on file  Physical Activity:   . Days of Exercise per Week: Not on file  . Minutes of Exercise per Session: Not on file  Stress:   . Feeling of Stress : Not on file  Social Connections:   . Frequency of Communication with Friends and Family: Not on file  . Frequency of Social Gatherings with Friends and Family: Not on file  . Attends Religious Services: Not on file  . Active Member of Clubs or Organizations: Not on file  . Attends Banker Meetings: Not on file  . Marital Status: Not on file   Outpatient Encounter  Medications as of 01/18/2019  Medication Sig  . aspirin 81 MG tablet Take 81 mg by mouth daily.  Marland Kitchen. atorvastatin (LIPITOR) 40 MG tablet Take 40 mg by mouth daily.   . B-D ULTRAFINE III SHORT PEN 31G X 8 MM MISC USE 1 PEN NEEDLE TWICE A DAY  . baclofen (LIORESAL) 10 MG tablet TAKE 1 TABLET (10 MG) IN THE MORNING AND 2 TABLETS (20 MG) IN THE EVENING  . Calcium Carbonate (CALCIUM 600 PO) Take 1 tablet by mouth daily.  Marland Kitchen. donepezil (ARICEPT) 5 MG tablet Take 1 tablet (5 mg total) by mouth at bedtime.  . Ergocalciferol 2000 units CAPS Take 2,000 capsules by mouth daily.   Marland Kitchen. glucose blood (ONE TOUCH ULTRA TEST) test strip AS DIRECTED TO TEST BLOOD SUGAR FOUR TIMES DAILY  . glucose blood (ONE TOUCH ULTRA TEST) test strip Use as instructed 2 x daily. E11.65 One touch Ultra  . imipramine (TOFRANIL) 25 MG tablet TAKE 1 TABLET AT BEDTIME  . Insulin Glargine (LANTUS  SOLOSTAR) 100 UNIT/ML Solostar Pen INJECT 60 UNITS UNDER THE SKIN DAILY WITH BREAKFAST  . levothyroxine (SYNTHROID, LEVOTHROID) 25 MCG tablet Take 75 mcg daily before breakfast by mouth.  . metFORMIN (GLUCOPHAGE-XR) 500 MG 24 hr tablet TAKE 1 TABLET TWICE A DAY  . methenamine (HIPREX) 1 g tablet 2 (two) times daily with a meal.   . RELION PEN NEEDLES 32G X 4 MM MISC   . Tamsulosin HCl (FLOMAX) 0.4 MG CAPS Take 0.4 mg by mouth daily after breakfast.   . tolterodine (DETROL LA) 4 MG 24 hr capsule TAKE 1 CAPSULE DAILY  . TOPROL XL 25 MG 24 hr tablet Take 25 mg by mouth daily.   . TRULICITY 0.75 MG/0.5ML SOPN INJECT 0.5 ML (0.75 MG) UNDER THE SKIN ONCE WEEKLY   No facility-administered encounter medications on file as of 01/18/2019.   ALLERGIES: Allergies  Allergen Reactions  . Penicillins Anaphylaxis    Has patient had a PCN reaction causing immediate rash, facial/tongue/throat swelling, SOB or lightheadedness with hypotension: Yes Has patient had a PCN reaction causing severe rash involving mucus membranes or skin necrosis: No Has patient had a PCN reaction that required hospitalization No Has patient had a PCN reaction occurring within the last 10 years: No If all of the above answers are "NO", then may proceed with Cephalosporin use.   Marland Kitchen. Lotensin [Benazepril Hcl] Cough   VACCINATION STATUS: Immunization History  Administered Date(s) Administered  . Influenza,inj,Quad PF,6+ Mos 10/09/2018    Diabetes She presents for her follow-up diabetic visit. She has type 2 diabetes mellitus. Onset time: She was diagnosed at approximate age of 52 years. Her disease course has been stable. There are no hypoglycemic associated symptoms. Pertinent negatives for hypoglycemia include no confusion, headaches, pallor or seizures. Associated symptoms include fatigue. Pertinent negatives for diabetes include no chest pain, no polydipsia, no polyphagia and no polyuria. There are no hypoglycemic complications.  Symptoms are stable. Diabetic complications include nephropathy and peripheral neuropathy. Risk factors for coronary artery disease include diabetes mellitus, dyslipidemia, hypertension and sedentary lifestyle. Current diabetic treatment includes insulin injections and oral agent (monotherapy). She is compliant with treatment most of the time. She is following a generally unhealthy diet. She has had a previous visit with a dietitian. She never participates in exercise. There is no change in her home blood glucose trend. Her breakfast blood glucose range is generally 110-130 mg/dl. Her bedtime blood glucose range is generally 140-180 mg/dl. Her overall blood glucose range  is 130-140 mg/dl. An ACE inhibitor/angiotensin II receptor blocker is being taken.  Thyroid Problem Presents for follow-up visit. Symptoms include fatigue. Patient reports no cold intolerance, diarrhea, heat intolerance or palpitations. The symptoms have been improving. Past treatments include levothyroxine. Her past medical history is significant for diabetes and hyperlipidemia.  Hyperlipidemia This is a chronic problem. The current episode started more than 1 year ago. Exacerbating diseases include diabetes, hypothyroidism and obesity. Pertinent negatives include no chest pain, myalgias or shortness of breath. Current antihyperlipidemic treatment includes statins. Risk factors for coronary artery disease include diabetes mellitus, dyslipidemia, hypertension, obesity, a sedentary lifestyle and post-menopausal.  Hypertension This is a chronic problem. The current episode started more than 1 year ago. Pertinent negatives include no chest pain, headaches, palpitations or shortness of breath. Risk factors for coronary artery disease include dyslipidemia, diabetes mellitus, obesity, sedentary lifestyle, family history and post-menopausal state. Past treatments include angiotensin blockers. Identifiable causes of hypertension include a thyroid  problem.   Review of systems: Limited as above.    Objective:    There were no vitals taken for this visit.  Wt Readings from Last 3 Encounters:  01/17/19 230 lb 9.6 oz (104.6 kg)  07/04/18 226 lb (102.5 kg)  02/28/18 232 lb (105.2 kg)      Results for orders placed or performed in visit on 01/17/19  CBC with Differential/Platelets  Result Value Ref Range   WBC 8.4 3.4 - 10.8 x10E3/uL   RBC 4.91 3.77 - 5.28 x10E6/uL   Hemoglobin 13.4 11.1 - 15.9 g/dL   Hematocrit 40.9 34.0 - 46.6 %   MCV 83 79 - 97 fL   MCH 27.3 26.6 - 33.0 pg   MCHC 32.8 31.5 - 35.7 g/dL   RDW 13.1 11.7 - 15.4 %   Platelets 384 150 - 450 x10E3/uL   Neutrophils 74 Not Estab. %   Lymphs 14 Not Estab. %   Monocytes 8 Not Estab. %   Eos 3 Not Estab. %   Basos 1 Not Estab. %   Neutrophils Absolute 6.3 1.4 - 7.0 x10E3/uL   Lymphocytes Absolute 1.2 0.7 - 3.1 x10E3/uL   Monocytes Absolute 0.7 0.1 - 0.9 x10E3/uL   EOS (ABSOLUTE) 0.2 0.0 - 0.4 x10E3/uL   Basophils Absolute 0.1 0.0 - 0.2 x10E3/uL   Immature Granulocytes 0 Not Estab. %   Immature Grans (Abs) 0.0 0.0 - 0.1 x10E3/uL   Complete Blood Count (Most recent): Lab Results  Component Value Date   WBC 8.4 01/17/2019   HGB 13.4 01/17/2019   HCT 40.9 01/17/2019   MCV 83 01/17/2019   PLT 384 01/17/2019   Chemistry (most recent): Lab Results  Component Value Date   NA 142 01/16/2019   K 4.6 01/16/2019   CL 103 01/16/2019   CO2 27 01/16/2019   BUN 32 (H) 01/16/2019   CREATININE 0.91 01/16/2019   Diabetic Labs (most recent): Lab Results  Component Value Date   HGBA1C 6.8 (H) 01/16/2019   HGBA1C 6.5 (H) 06/21/2018   HGBA1C 6.7 (A) 09/20/2017   Lipid Panel     Component Value Date/Time   CHOL 171 09/20/2017 0000   TRIG 115 09/20/2017 0000   HDL 57 09/20/2017 0000   CHOLHDL 2.7 03/17/2016 1228   VLDL 23 03/17/2016 1228   LDLCALC 91 09/20/2017 0000    Assessment & Plan:   1. Type 2 diabetes mellitus with stage 2 chronic kidney disease,  with long-term current use of insulin (HCC)  -Her CKD has reversed,  patient remains at a high risk for more acute and chronic complications of diabetes which include CAD, CVA, CKD, retinopathy, and neuropathy. These are all discussed in detail with the patient. - Lately,  she is getting a big help from her sisters , [name] and [name].  - They make sure that Patrece gets her medications especially the morning injections of insulin.  Her A1c has improved to 6.8%, overall improving from 10.9% .   -  Suggestion is made for her to avoid simple carbohydrates  from her diet including Cakes, Sweet Desserts / Pastries, Ice Cream, Soda (diet and regular), Sweet Tea, Candies, Chips, Cookies, Sweet Pastries,  Store Bought Juices, Alcohol in Excess of  1-2 drinks a day, Artificial Sweeteners, Coffee Creamer, and "Sugar-free" Products. This will help patient to have stable blood glucose profile and potentially avoid unintended weight gain.   - Patient is advised to stick to a routine mealtimes to eat 3 meals  a day and avoid unnecessary snacks ( to snack only to correct hypoglycemia).  - I have approached patient with the following individualized plan to manage diabetes and patient agrees.  -Based on her reported glycemic profile which are on target, she will continue to benefit from basal insulin, will not need prandial insulin for now.   -She is advised to continue Lantus 60 units  q. a.m. with breakfast,  associated with strict monitoring of blood glucose 2 times a day-before breakfast  and at bedtime. -She  is encouraged to call clinic for blood glucose levels less than 70 or above 200 mg /dl. -She has normal renal function, will continue to benefit from metformin treatment.  She is advised to continue  metformin ER  at 500mg  po twice a day.  -She is advised to continue Trulicity 0.75 mg subcutaneously weekly.   -Target numbers for A1c, LDL, HDL, Triglycerides, - discussed in detail.   - Patient specific  target  for A1c; LDL, HDL, Triglycerides, and  Waist Circumference were discussed in detail.  2) BP/HTN: She has well-controlled blood pressure.  She is advised to continue current medications including Toprol 25 mg p.o. daily .  3) Lipids/HPL: Her recent lipid panel showed uncontrolled LDL at 91.  She is currently on atorvastatin 40 mg p.o. nightly.   4)  Weight/Diet: CDE consult in progress, exercise, and carbohydrates information provided.  5)  Primary hypothyroidism -Her previsit thyroid function tests are consistent with appropriate replacement. -He is advised to continue levothyroxine 75 mcg p.o. every morning.   - We discussed about the correct intake of her thyroid hormone, on empty stomach at fasting, with water, separated by at least 30 minutes from breakfast and other medications,  and separated by more than 4 hours from calcium, iron, multivitamins, acid reflux medications (PPIs). -Patient is made aware of the fact that thyroid hormone replacement is needed for life, dose to be adjusted by periodic monitoring of thyroid function tests.  6) Chronic Care/Health Maintenance:  -Patient is on ACEI/ARB and Statin medications and encouraged to continue to follow up with Ophthalmology, Podiatrist at least yearly or according to recommendations, and advised to  stay away from smoking. I have recommended yearly flu vaccine and pneumonia vaccination at least every 5 years; and  sleep for at least 7 hours a day.  I advised patient to maintain close follow up with her PCP for primary care needs. - Patient Care Time Today:  25 min, of which >50% was spent in  counseling and the rest reviewing  her  current and  previous labs/studies, previous treatments, her blood glucose readings, and medications' doses and developing a plan for long-term care based on the latest recommendations for standards of care.   Margaret SaasJanet S Boliver participated in the discussions, expressed understanding, and voiced agreement  with the above plans.  All questions were answered to her satisfaction. she is encouraged to contact clinic should she have any questions or concerns prior to her return visit.  Follow up plan: Return in about 6 months (around 07/19/2019) for Bring Meter and Logs- A1c in Office.  Marquis LunchGebre Tayvion Lauder, MD Phone: [phone]  Fax: [phone]   01/18/2019, 5:11 PM

## 2019-01-24 DIAGNOSIS — G35 Multiple sclerosis: Secondary | ICD-10-CM | POA: Diagnosis not present

## 2019-01-24 DIAGNOSIS — R2689 Other abnormalities of gait and mobility: Secondary | ICD-10-CM | POA: Diagnosis not present

## 2019-01-24 DIAGNOSIS — E039 Hypothyroidism, unspecified: Secondary | ICD-10-CM | POA: Diagnosis not present

## 2019-01-24 DIAGNOSIS — G3184 Mild cognitive impairment, so stated: Secondary | ICD-10-CM | POA: Diagnosis not present

## 2019-01-24 DIAGNOSIS — E119 Type 2 diabetes mellitus without complications: Secondary | ICD-10-CM | POA: Diagnosis not present

## 2019-01-24 DIAGNOSIS — I10 Essential (primary) hypertension: Secondary | ICD-10-CM | POA: Diagnosis not present

## 2019-01-25 ENCOUNTER — Telehealth: Payer: Self-pay | Admitting: Family Medicine

## 2019-01-25 NOTE — Telephone Encounter (Signed)
Company: [facility])   Frequency: 2 week 3. 1 week 2  OT,PT,HOME HEALTH: Speech Therapy  CB# [phone]  May leave Verbal on Voicemail if no answer.

## 2019-01-25 NOTE — Telephone Encounter (Signed)
[facility] for services listed per AL/NP.  LMVM for [facility].

## 2019-01-26 DIAGNOSIS — E119 Type 2 diabetes mellitus without complications: Secondary | ICD-10-CM | POA: Diagnosis not present

## 2019-01-26 DIAGNOSIS — G3184 Mild cognitive impairment, so stated: Secondary | ICD-10-CM | POA: Diagnosis not present

## 2019-01-26 DIAGNOSIS — R2689 Other abnormalities of gait and mobility: Secondary | ICD-10-CM | POA: Diagnosis not present

## 2019-01-26 DIAGNOSIS — G35 Multiple sclerosis: Secondary | ICD-10-CM | POA: Diagnosis not present

## 2019-01-26 DIAGNOSIS — E039 Hypothyroidism, unspecified: Secondary | ICD-10-CM | POA: Diagnosis not present

## 2019-01-26 DIAGNOSIS — I10 Essential (primary) hypertension: Secondary | ICD-10-CM | POA: Diagnosis not present

## 2019-01-29 DIAGNOSIS — E119 Type 2 diabetes mellitus without complications: Secondary | ICD-10-CM | POA: Diagnosis not present

## 2019-01-29 DIAGNOSIS — E039 Hypothyroidism, unspecified: Secondary | ICD-10-CM | POA: Diagnosis not present

## 2019-01-29 DIAGNOSIS — I10 Essential (primary) hypertension: Secondary | ICD-10-CM | POA: Diagnosis not present

## 2019-01-29 DIAGNOSIS — R2689 Other abnormalities of gait and mobility: Secondary | ICD-10-CM | POA: Diagnosis not present

## 2019-01-29 DIAGNOSIS — G3184 Mild cognitive impairment, so stated: Secondary | ICD-10-CM | POA: Diagnosis not present

## 2019-01-29 DIAGNOSIS — G35 Multiple sclerosis: Secondary | ICD-10-CM | POA: Diagnosis not present

## 2019-01-30 DIAGNOSIS — I10 Essential (primary) hypertension: Secondary | ICD-10-CM | POA: Diagnosis not present

## 2019-01-30 DIAGNOSIS — R2689 Other abnormalities of gait and mobility: Secondary | ICD-10-CM | POA: Diagnosis not present

## 2019-01-30 DIAGNOSIS — E039 Hypothyroidism, unspecified: Secondary | ICD-10-CM | POA: Diagnosis not present

## 2019-01-30 DIAGNOSIS — G3184 Mild cognitive impairment, so stated: Secondary | ICD-10-CM | POA: Diagnosis not present

## 2019-01-30 DIAGNOSIS — E119 Type 2 diabetes mellitus without complications: Secondary | ICD-10-CM | POA: Diagnosis not present

## 2019-01-30 DIAGNOSIS — G35 Multiple sclerosis: Secondary | ICD-10-CM | POA: Diagnosis not present

## 2019-01-31 DIAGNOSIS — G35 Multiple sclerosis: Secondary | ICD-10-CM | POA: Diagnosis not present

## 2019-01-31 DIAGNOSIS — I10 Essential (primary) hypertension: Secondary | ICD-10-CM | POA: Diagnosis not present

## 2019-01-31 DIAGNOSIS — R2689 Other abnormalities of gait and mobility: Secondary | ICD-10-CM | POA: Diagnosis not present

## 2019-01-31 DIAGNOSIS — G3184 Mild cognitive impairment, so stated: Secondary | ICD-10-CM | POA: Diagnosis not present

## 2019-01-31 DIAGNOSIS — E119 Type 2 diabetes mellitus without complications: Secondary | ICD-10-CM | POA: Diagnosis not present

## 2019-01-31 DIAGNOSIS — E039 Hypothyroidism, unspecified: Secondary | ICD-10-CM | POA: Diagnosis not present

## 2019-02-01 DIAGNOSIS — E119 Type 2 diabetes mellitus without complications: Secondary | ICD-10-CM | POA: Diagnosis not present

## 2019-02-01 DIAGNOSIS — E039 Hypothyroidism, unspecified: Secondary | ICD-10-CM | POA: Diagnosis not present

## 2019-02-01 DIAGNOSIS — R2689 Other abnormalities of gait and mobility: Secondary | ICD-10-CM | POA: Diagnosis not present

## 2019-02-01 DIAGNOSIS — G35 Multiple sclerosis: Secondary | ICD-10-CM | POA: Diagnosis not present

## 2019-02-01 DIAGNOSIS — G3184 Mild cognitive impairment, so stated: Secondary | ICD-10-CM | POA: Diagnosis not present

## 2019-02-01 DIAGNOSIS — I10 Essential (primary) hypertension: Secondary | ICD-10-CM | POA: Diagnosis not present

## 2019-02-06 DIAGNOSIS — E119 Type 2 diabetes mellitus without complications: Secondary | ICD-10-CM | POA: Diagnosis not present

## 2019-02-06 DIAGNOSIS — E039 Hypothyroidism, unspecified: Secondary | ICD-10-CM | POA: Diagnosis not present

## 2019-02-06 DIAGNOSIS — I10 Essential (primary) hypertension: Secondary | ICD-10-CM | POA: Diagnosis not present

## 2019-02-06 DIAGNOSIS — G3184 Mild cognitive impairment, so stated: Secondary | ICD-10-CM | POA: Diagnosis not present

## 2019-02-06 DIAGNOSIS — G35 Multiple sclerosis: Secondary | ICD-10-CM | POA: Diagnosis not present

## 2019-02-06 DIAGNOSIS — R2689 Other abnormalities of gait and mobility: Secondary | ICD-10-CM | POA: Diagnosis not present

## 2019-02-07 DIAGNOSIS — G3184 Mild cognitive impairment, so stated: Secondary | ICD-10-CM | POA: Diagnosis not present

## 2019-02-07 DIAGNOSIS — I10 Essential (primary) hypertension: Secondary | ICD-10-CM | POA: Diagnosis not present

## 2019-02-07 DIAGNOSIS — R2689 Other abnormalities of gait and mobility: Secondary | ICD-10-CM | POA: Diagnosis not present

## 2019-02-07 DIAGNOSIS — E119 Type 2 diabetes mellitus without complications: Secondary | ICD-10-CM | POA: Diagnosis not present

## 2019-02-07 DIAGNOSIS — G35 Multiple sclerosis: Secondary | ICD-10-CM | POA: Diagnosis not present

## 2019-02-07 DIAGNOSIS — E039 Hypothyroidism, unspecified: Secondary | ICD-10-CM | POA: Diagnosis not present

## 2019-02-08 ENCOUNTER — Telehealth: Payer: Self-pay

## 2019-02-08 DIAGNOSIS — G35 Multiple sclerosis: Secondary | ICD-10-CM | POA: Diagnosis not present

## 2019-02-08 DIAGNOSIS — I10 Essential (primary) hypertension: Secondary | ICD-10-CM | POA: Diagnosis not present

## 2019-02-08 DIAGNOSIS — G3184 Mild cognitive impairment, so stated: Secondary | ICD-10-CM | POA: Diagnosis not present

## 2019-02-08 DIAGNOSIS — E119 Type 2 diabetes mellitus without complications: Secondary | ICD-10-CM | POA: Diagnosis not present

## 2019-02-08 DIAGNOSIS — R2689 Other abnormalities of gait and mobility: Secondary | ICD-10-CM | POA: Diagnosis not present

## 2019-02-08 DIAGNOSIS — E039 Hypothyroidism, unspecified: Secondary | ICD-10-CM | POA: Diagnosis not present

## 2019-02-08 NOTE — Telephone Encounter (Signed)
Orders have been signed by Shawnie Dapper, NP and faxed back to [facility]. Confirmation fax has been received.

## 2019-02-09 DIAGNOSIS — G3184 Mild cognitive impairment, so stated: Secondary | ICD-10-CM | POA: Diagnosis not present

## 2019-02-09 DIAGNOSIS — G35 Multiple sclerosis: Secondary | ICD-10-CM | POA: Diagnosis not present

## 2019-02-09 DIAGNOSIS — E119 Type 2 diabetes mellitus without complications: Secondary | ICD-10-CM | POA: Diagnosis not present

## 2019-02-09 DIAGNOSIS — E039 Hypothyroidism, unspecified: Secondary | ICD-10-CM | POA: Diagnosis not present

## 2019-02-09 DIAGNOSIS — I10 Essential (primary) hypertension: Secondary | ICD-10-CM | POA: Diagnosis not present

## 2019-02-09 DIAGNOSIS — R2689 Other abnormalities of gait and mobility: Secondary | ICD-10-CM | POA: Diagnosis not present

## 2019-02-13 DIAGNOSIS — G35 Multiple sclerosis: Secondary | ICD-10-CM | POA: Diagnosis not present

## 2019-02-14 DIAGNOSIS — G35 Multiple sclerosis: Secondary | ICD-10-CM | POA: Diagnosis not present

## 2019-02-14 DIAGNOSIS — G3184 Mild cognitive impairment, so stated: Secondary | ICD-10-CM | POA: Diagnosis not present

## 2019-02-14 DIAGNOSIS — E119 Type 2 diabetes mellitus without complications: Secondary | ICD-10-CM | POA: Diagnosis not present

## 2019-02-14 DIAGNOSIS — R2689 Other abnormalities of gait and mobility: Secondary | ICD-10-CM | POA: Diagnosis not present

## 2019-02-14 DIAGNOSIS — E039 Hypothyroidism, unspecified: Secondary | ICD-10-CM | POA: Diagnosis not present

## 2019-02-14 DIAGNOSIS — I10 Essential (primary) hypertension: Secondary | ICD-10-CM | POA: Diagnosis not present

## 2019-02-15 DIAGNOSIS — G35 Multiple sclerosis: Secondary | ICD-10-CM | POA: Diagnosis not present

## 2019-02-15 DIAGNOSIS — I10 Essential (primary) hypertension: Secondary | ICD-10-CM | POA: Diagnosis not present

## 2019-02-15 DIAGNOSIS — E039 Hypothyroidism, unspecified: Secondary | ICD-10-CM | POA: Diagnosis not present

## 2019-02-15 DIAGNOSIS — G3184 Mild cognitive impairment, so stated: Secondary | ICD-10-CM | POA: Diagnosis not present

## 2019-02-15 DIAGNOSIS — E119 Type 2 diabetes mellitus without complications: Secondary | ICD-10-CM | POA: Diagnosis not present

## 2019-02-15 DIAGNOSIS — R2689 Other abnormalities of gait and mobility: Secondary | ICD-10-CM | POA: Diagnosis not present

## 2019-02-17 ENCOUNTER — Ambulatory Visit
Admission: RE | Admit: 2019-02-17 | Discharge: 2019-02-17 | Disposition: A | Discharge status: Home / Self Care | Payer: Medicare Other | Source: Ambulatory Visit | Attending: Family Medicine | Admitting: Family Medicine

## 2019-02-17 DIAGNOSIS — Z79899 Other long term (current) drug therapy: Secondary | ICD-10-CM | POA: Diagnosis not present

## 2019-02-17 DIAGNOSIS — R2689 Other abnormalities of gait and mobility: Secondary | ICD-10-CM | POA: Diagnosis not present

## 2019-02-17 DIAGNOSIS — I10 Essential (primary) hypertension: Secondary | ICD-10-CM | POA: Diagnosis not present

## 2019-02-17 DIAGNOSIS — Z6839 Body mass index (BMI) 39.0-39.9, adult: Secondary | ICD-10-CM | POA: Diagnosis not present

## 2019-02-17 DIAGNOSIS — Z7982 Long term (current) use of aspirin: Secondary | ICD-10-CM | POA: Diagnosis not present

## 2019-02-17 DIAGNOSIS — E7800 Pure hypercholesterolemia, unspecified: Secondary | ICD-10-CM | POA: Diagnosis not present

## 2019-02-17 DIAGNOSIS — G35 Multiple sclerosis: Secondary | ICD-10-CM

## 2019-02-17 DIAGNOSIS — E119 Type 2 diabetes mellitus without complications: Secondary | ICD-10-CM | POA: Diagnosis not present

## 2019-02-17 DIAGNOSIS — G3184 Mild cognitive impairment, so stated: Secondary | ICD-10-CM | POA: Diagnosis not present

## 2019-02-17 DIAGNOSIS — Z794 Long term (current) use of insulin: Secondary | ICD-10-CM | POA: Diagnosis not present

## 2019-02-17 DIAGNOSIS — E039 Hypothyroidism, unspecified: Secondary | ICD-10-CM | POA: Diagnosis not present

## 2019-02-17 MED ORDER — GADOBENATE DIMEGLUMINE 529 MG/ML IV SOLN
20.0000 mL | Freq: Once | INTRAVENOUS | Status: AC | PRN
  Administered 2019-02-17: 20 mL via INTRAVENOUS

## 2019-02-20 DIAGNOSIS — E039 Hypothyroidism, unspecified: Secondary | ICD-10-CM | POA: Diagnosis not present

## 2019-02-20 DIAGNOSIS — G3184 Mild cognitive impairment, so stated: Secondary | ICD-10-CM | POA: Diagnosis not present

## 2019-02-20 DIAGNOSIS — G35 Multiple sclerosis: Secondary | ICD-10-CM | POA: Diagnosis not present

## 2019-02-20 DIAGNOSIS — I10 Essential (primary) hypertension: Secondary | ICD-10-CM | POA: Diagnosis not present

## 2019-02-20 DIAGNOSIS — E119 Type 2 diabetes mellitus without complications: Secondary | ICD-10-CM | POA: Diagnosis not present

## 2019-02-20 DIAGNOSIS — R2689 Other abnormalities of gait and mobility: Secondary | ICD-10-CM | POA: Diagnosis not present

## 2019-02-21 DIAGNOSIS — R2689 Other abnormalities of gait and mobility: Secondary | ICD-10-CM | POA: Diagnosis not present

## 2019-02-21 DIAGNOSIS — E039 Hypothyroidism, unspecified: Secondary | ICD-10-CM | POA: Diagnosis not present

## 2019-02-21 DIAGNOSIS — G3184 Mild cognitive impairment, so stated: Secondary | ICD-10-CM | POA: Diagnosis not present

## 2019-02-21 DIAGNOSIS — I10 Essential (primary) hypertension: Secondary | ICD-10-CM | POA: Diagnosis not present

## 2019-02-21 DIAGNOSIS — E119 Type 2 diabetes mellitus without complications: Secondary | ICD-10-CM | POA: Diagnosis not present

## 2019-02-21 DIAGNOSIS — G35 Multiple sclerosis: Secondary | ICD-10-CM | POA: Diagnosis not present

## 2019-02-24 DIAGNOSIS — R413 Other amnesia: Secondary | ICD-10-CM | POA: Diagnosis not present

## 2019-02-24 DIAGNOSIS — I10 Essential (primary) hypertension: Secondary | ICD-10-CM | POA: Diagnosis not present

## 2019-02-24 DIAGNOSIS — Z6839 Body mass index (BMI) 39.0-39.9, adult: Secondary | ICD-10-CM | POA: Diagnosis not present

## 2019-02-24 DIAGNOSIS — G35 Multiple sclerosis: Secondary | ICD-10-CM | POA: Diagnosis not present

## 2019-02-24 DIAGNOSIS — Z299 Encounter for prophylactic measures, unspecified: Secondary | ICD-10-CM | POA: Diagnosis not present

## 2019-02-24 DIAGNOSIS — E1165 Type 2 diabetes mellitus with hyperglycemia: Secondary | ICD-10-CM | POA: Diagnosis not present

## 2019-02-27 ENCOUNTER — Telehealth: Payer: Self-pay | Admitting: *Deleted

## 2019-02-27 NOTE — Telephone Encounter (Signed)
''  per Amy,NP. Results given to her Pam her sister.  I called to set up appt for 4 months, she stated that her next time would be 08-21-19 ( I made this at 1030).  This is 6 months.  She will check her schedule and call back to change if possible.

## 2019-02-27 NOTE — Telephone Encounter (Signed)
-----   Message from Shawnie Dapper, NP sent at 02/23/2019  4:37 PM EST ----- I have spoken with Elita Quick, patient's sister and caregiver, as well as Mrs. Mullany regarding the results of her most recent MRI.  She has been participating in physical therapy twice weekly for the last 4 weeks.  She reports that right leg weakness is significantly improved.  She is no longer dragging her foot.  There have been no new concerns of numbness or weakness.  We have discussed concerns of potential new lesions noted on MRI.  The abnormalities are so small that is hard to tell at this time.  We have discussed adding steroids the patient does not feel that this is warranted at this time.  She will continue Ocrevus infusions.  She will call with any new or worsening symptoms.  I would like for her to follow-up with Dr. Vickey Huger with possibility of repeating MRI in 4 to 6 months.  Patient and her sister both verbalized understanding and agreement with this plan.  I will asked Andrey Campanile or Baird Lyons to get her set up with an appointment to follow-up with Dr. Vickey Huger in 4 months.

## 2019-02-28 DIAGNOSIS — G3184 Mild cognitive impairment, so stated: Secondary | ICD-10-CM | POA: Diagnosis not present

## 2019-02-28 DIAGNOSIS — E119 Type 2 diabetes mellitus without complications: Secondary | ICD-10-CM | POA: Diagnosis not present

## 2019-02-28 DIAGNOSIS — I10 Essential (primary) hypertension: Secondary | ICD-10-CM | POA: Diagnosis not present

## 2019-02-28 DIAGNOSIS — R2689 Other abnormalities of gait and mobility: Secondary | ICD-10-CM | POA: Diagnosis not present

## 2019-02-28 DIAGNOSIS — E039 Hypothyroidism, unspecified: Secondary | ICD-10-CM | POA: Diagnosis not present

## 2019-02-28 DIAGNOSIS — G35 Multiple sclerosis: Secondary | ICD-10-CM | POA: Diagnosis not present

## 2019-03-07 DIAGNOSIS — G35 Multiple sclerosis: Secondary | ICD-10-CM | POA: Diagnosis not present

## 2019-03-07 DIAGNOSIS — G3184 Mild cognitive impairment, so stated: Secondary | ICD-10-CM | POA: Diagnosis not present

## 2019-03-07 DIAGNOSIS — I10 Essential (primary) hypertension: Secondary | ICD-10-CM | POA: Diagnosis not present

## 2019-03-07 DIAGNOSIS — E039 Hypothyroidism, unspecified: Secondary | ICD-10-CM | POA: Diagnosis not present

## 2019-03-07 DIAGNOSIS — E119 Type 2 diabetes mellitus without complications: Secondary | ICD-10-CM | POA: Diagnosis not present

## 2019-03-07 DIAGNOSIS — R2689 Other abnormalities of gait and mobility: Secondary | ICD-10-CM | POA: Diagnosis not present

## 2019-03-09 ENCOUNTER — Telehealth: Payer: Self-pay

## 2019-03-09 NOTE — Telephone Encounter (Signed)
Orders have been signed and faxed back [facility]. Confirmation fax has been received.

## 2019-03-11 ENCOUNTER — Other Ambulatory Visit: Payer: Self-pay | Admitting: "Endocrinology

## 2019-03-11 DIAGNOSIS — E1122 Type 2 diabetes mellitus with diabetic chronic kidney disease: Secondary | ICD-10-CM

## 2019-03-14 DIAGNOSIS — R2689 Other abnormalities of gait and mobility: Secondary | ICD-10-CM | POA: Diagnosis not present

## 2019-03-14 DIAGNOSIS — E119 Type 2 diabetes mellitus without complications: Secondary | ICD-10-CM | POA: Diagnosis not present

## 2019-03-14 DIAGNOSIS — E039 Hypothyroidism, unspecified: Secondary | ICD-10-CM | POA: Diagnosis not present

## 2019-03-14 DIAGNOSIS — I10 Essential (primary) hypertension: Secondary | ICD-10-CM | POA: Diagnosis not present

## 2019-03-14 DIAGNOSIS — G3184 Mild cognitive impairment, so stated: Secondary | ICD-10-CM | POA: Diagnosis not present

## 2019-03-14 DIAGNOSIS — G35 Multiple sclerosis: Secondary | ICD-10-CM | POA: Diagnosis not present

## 2019-03-17 DIAGNOSIS — I10 Essential (primary) hypertension: Secondary | ICD-10-CM | POA: Diagnosis not present

## 2019-03-17 DIAGNOSIS — E119 Type 2 diabetes mellitus without complications: Secondary | ICD-10-CM | POA: Diagnosis not present

## 2019-03-17 DIAGNOSIS — E7800 Pure hypercholesterolemia, unspecified: Secondary | ICD-10-CM | POA: Diagnosis not present

## 2019-03-20 ENCOUNTER — Telehealth: Payer: Self-pay

## 2019-03-20 DIAGNOSIS — G35 Multiple sclerosis: Secondary | ICD-10-CM

## 2019-03-20 MED ORDER — DONEPEZIL HCL 5 MG PO TABS: 5.0000 mg | ORAL_TABLET | Freq: Every day | ORAL | 3 refills | Status: DC

## 2019-03-20 NOTE — Telephone Encounter (Signed)
New script has been sent to the patient's pharmacy  

## 2019-03-22 ENCOUNTER — Other Ambulatory Visit (HOSPITAL_COMMUNITY): Payer: Self-pay | Admitting: Internal Medicine

## 2019-03-22 DIAGNOSIS — Z09 Encounter for follow-up examination after completed treatment for conditions other than malignant neoplasm: Secondary | ICD-10-CM

## 2019-03-23 ENCOUNTER — Other Ambulatory Visit (HOSPITAL_COMMUNITY): Payer: Self-pay | Admitting: Internal Medicine

## 2019-03-23 DIAGNOSIS — R921 Mammographic calcification found on diagnostic imaging of breast: Secondary | ICD-10-CM

## 2019-04-06 DIAGNOSIS — Z23 Encounter for immunization: Secondary | ICD-10-CM | POA: Diagnosis not present

## 2019-04-07 DIAGNOSIS — I10 Essential (primary) hypertension: Secondary | ICD-10-CM | POA: Diagnosis not present

## 2019-04-07 DIAGNOSIS — G35 Multiple sclerosis: Secondary | ICD-10-CM | POA: Diagnosis not present

## 2019-04-07 DIAGNOSIS — E039 Hypothyroidism, unspecified: Secondary | ICD-10-CM | POA: Diagnosis not present

## 2019-04-07 DIAGNOSIS — Z6839 Body mass index (BMI) 39.0-39.9, adult: Secondary | ICD-10-CM | POA: Diagnosis not present

## 2019-04-07 DIAGNOSIS — E1165 Type 2 diabetes mellitus with hyperglycemia: Secondary | ICD-10-CM | POA: Diagnosis not present

## 2019-04-07 DIAGNOSIS — Z299 Encounter for prophylactic measures, unspecified: Secondary | ICD-10-CM | POA: Diagnosis not present

## 2019-04-13 ENCOUNTER — Ambulatory Visit: Payer: Medicare Other

## 2019-04-13 DIAGNOSIS — E7800 Pure hypercholesterolemia, unspecified: Secondary | ICD-10-CM | POA: Diagnosis not present

## 2019-04-13 DIAGNOSIS — I10 Essential (primary) hypertension: Secondary | ICD-10-CM | POA: Diagnosis not present

## 2019-04-13 DIAGNOSIS — E119 Type 2 diabetes mellitus without complications: Secondary | ICD-10-CM | POA: Diagnosis not present

## 2019-04-16 ENCOUNTER — Other Ambulatory Visit: Payer: Self-pay | Admitting: "Endocrinology

## 2019-04-16 DIAGNOSIS — Z794 Long term (current) use of insulin: Secondary | ICD-10-CM

## 2019-04-16 DIAGNOSIS — E1122 Type 2 diabetes mellitus with diabetic chronic kidney disease: Secondary | ICD-10-CM

## 2019-04-18 ENCOUNTER — Ambulatory Visit (HOSPITAL_COMMUNITY)
Admission: RE | Admit: 2019-04-18 | Discharge: 2019-04-18 | Disposition: A | Discharge status: Home / Self Care | Payer: Medicare Other | Source: Ambulatory Visit | Attending: Internal Medicine | Admitting: Internal Medicine

## 2019-04-18 ENCOUNTER — Other Ambulatory Visit: Payer: Self-pay

## 2019-04-18 DIAGNOSIS — Z09 Encounter for follow-up examination after completed treatment for conditions other than malignant neoplasm: Secondary | ICD-10-CM | POA: Insufficient documentation

## 2019-04-18 DIAGNOSIS — R921 Mammographic calcification found on diagnostic imaging of breast: Secondary | ICD-10-CM

## 2019-05-05 DIAGNOSIS — E7800 Pure hypercholesterolemia, unspecified: Secondary | ICD-10-CM | POA: Diagnosis not present

## 2019-05-05 DIAGNOSIS — I10 Essential (primary) hypertension: Secondary | ICD-10-CM | POA: Diagnosis not present

## 2019-05-05 DIAGNOSIS — E119 Type 2 diabetes mellitus without complications: Secondary | ICD-10-CM | POA: Diagnosis not present

## 2019-05-06 DIAGNOSIS — Z23 Encounter for immunization: Secondary | ICD-10-CM | POA: Diagnosis not present

## 2019-06-12 ENCOUNTER — Telehealth: Payer: Self-pay | Admitting: Neurology

## 2019-06-12 NOTE — Telephone Encounter (Signed)
I received a communication form  [facility], being concerned about imipramine use in a patient with memory loss due to anticholinergic effect.   The patient is on Detrol and imipramine. There is anticholinergic side affects with all of these  For now, I leave her on this medication but [facility] will follow with [facility] or [facility] , q 6 month.

## 2019-06-14 ENCOUNTER — Other Ambulatory Visit: Payer: Self-pay | Admitting: "Endocrinology

## 2019-06-14 DIAGNOSIS — E1122 Type 2 diabetes mellitus with diabetic chronic kidney disease: Secondary | ICD-10-CM

## 2019-06-19 ENCOUNTER — Other Ambulatory Visit: Payer: Self-pay

## 2019-06-19 ENCOUNTER — Encounter: Payer: Self-pay | Admitting: Neurology

## 2019-06-19 ENCOUNTER — Ambulatory Visit (INDEPENDENT_AMBULATORY_CARE_PROVIDER_SITE_OTHER): Payer: Medicare Other | Admitting: Neurology

## 2019-06-19 VITALS — BP 132/84 | HR 75 | Ht 64.0 in | Wt 228.0 lb

## 2019-06-19 DIAGNOSIS — G3184 Mild cognitive impairment, so stated: Secondary | ICD-10-CM | POA: Diagnosis not present

## 2019-06-19 DIAGNOSIS — G35 Multiple sclerosis: Secondary | ICD-10-CM | POA: Diagnosis not present

## 2019-06-19 DIAGNOSIS — Z79899 Other long term (current) drug therapy: Secondary | ICD-10-CM | POA: Diagnosis not present

## 2019-06-19 NOTE — Progress Notes (Signed)
[facility]  Provider:  Melvyn Novas, M D  Referring Provider: [name], MD Primary Care Physician:  [name], MD  Chief Complaint  Patient presents with  . Follow-up    pt with sister, rm 10. pt presents as a follow up. states things are about the same. has no issues or concerns. states memory is stable. She will be moving to [place] in october. She has her infusion scheduled here 8/2.      06-19-2019.I have the pleasure of meeting today with [name]. [name] again and meanwhile 64 year old Caucasian female MS patient.  She is today accompanied by her sister, [name],  visiting from [place].  The patient has been followed for many years, she originally was diagnosed with relapsing remitting MS which later converted to a  secondary progressive form. She had in 2017 started on Ocrevus.  At the time she had a brain MRI and cervical spine MRI without contrast but it confirmed that as to her diagnosis she has now reached already progressive MS.  Ocrevus was FDA approved for the treatment of progressive MS and for this reason we changed to the drug at the time.  There was no other medication FDA approved for the treatment of this form.  Her next infusion will be on August 2.  The patient plans to move to [place] and join her sister in October of this year.   We will have to make arrangements for her that at a local infusion center and neurologist will follow her. She has a cousin in [place] who suffers from MS.  Clinically our visit today was directed to words her more recent complaints of cognitive difficulties, and I changed her diagnosis to mild cognitive impairment with memory loss in November 2018.  Today she was evaluated by a Montreal cognitive assessment test and scored 28 out of 30 points, this is not dementia.  I have to say that in the years 2017 2018 there were a lot of memory lapses, cognitive deficits and some led to her not appearing for appointments not  having blood drawn and treatments being delayed.  This seems no longer to be an issue and I wonder if it was also either a state of depression where a medication side effect or medical condition side effect.  She is conversant fluent and again a Montreal cognitive assessment of 28 out of 30 point is an excellent result.       Interval history from 23 December 2016, I am seeing [name] here today long-established patient with MS and recent cognitive decline, in the presence of her sister [name], [place].  In our last visit 6 weeks ago we had discussed to start endocrine fluids for the treatment of MS which helps her to be compliant. The first infusion is scheduled for December 27 th.  In preparation we had obtained a blood test for tuberculosis was returned negative,  negative hepatitis panel, comprehensive metabolic panel and CBC.  Her creatinine is a slight bit elevated, but her glomerular filtration rate was 59 -which is still considered normal.   She also had high blood sugars.  White and red blood cell counts were normal region.There was no anemia.  The patient's MRI from 23 November 2016 showed multiple T2 and FLAIR hyperintense foci in the pattern and configuration consistent with demyelinating plaques.  This is a chronic multiple sclerosis as none of the foci appear to be acute.  I consider the patient a good  candidate for OCREVUS. I was pleasantly surprised to see the patient looking brighter, more alert and certainly overall healthy.today She has eaten more regularly and more fresh food, she also has more social interaction leaves the house more often.  Appears more alert.  She is able to walk with her  4 wheeled walker. Margaret [name] felt that Aricept has had a positive effect on her sister.  We will repeat a MOCA/ MMSE in 3 month , next visit with NP.    HPI: 11-09-2016 [name] is a 63 y.o. female  Is seen here as a revisit  from Dr. Sherril Croon for multiple sclerosis follow up.  I  had the pleasure of seeing [name] today in the presence of her sister, physician assistant [name]. I know [name] from the [facility] radiology department where she has worked with the interventional team. I last saw [name] in late 2017 when our goal was to change her to a medication of infusion character, namely OCREVUS. At the time she had an MRI with out contrast that confirmed that and as was her diagnosis, but could not 100% differentiate between acute and more remote lesions. Now, almost a year later when it again as Margaret. Hanson had missed some of her appointments. From now on she will bring her sister with her. My goal is still to change the patient to a therapy that doesn't need to daily or every other day compliance neither pill no injection but rather an infusion therapy here in office. Low compliance would therefore not be as impairing. I would still like to change her to OCREVUS- but will have to repeat the hepatitis panel, the gold tuberculosis test and the patient information form.  She is not driving any longer but lives alone- close to her sister. CD   The patient had followed Dr. Orlin Hanson for over 20 years , diagnosed since 1989 ,, and is now mainly seen by [name], NP. She has never been on another medication, but Betaseron.  In my last visit , 2013 , I suggested Ampyra to her , which we than initiated but had to D/C after her kidney function changed.   The patient is diabetic, diagnosed in 2008. She just changed in 2014 to insulin.  She remains on Betaseron. Has been gaining weight and has had a fall last December. Uses a Rollator when she is outdoors but indoors a walking stick or cane, but more and more frequently the walker. .   She believes her sleep is poor due to spasms and nocturia, she may snore. Still wakes with a dry mouth, has been told she snores. Sleeps prone. The patient underwent a sleep study I believe in 2014, but was unable to tolerate the CPAP due to  her sleeping habit. She has a chief complaint today of cramping pain in the left leg, spasm, at the groin and right above the hip. No radiation , no burning and no deep ache - just spams. Onset 3 weeks ago.  Although, she has taken baclofen po now tid  for a long time , for the " MS hug" - it had an effect only on abdominal spasms. On the same dose, it has not affected the hip spasms.  She believes her sleep is poor due to spasms and nocturia, she may snore. Wakes with a dry mouth, has been told she snores. Sleeps prone.  The spasms in the left hip are preceded by sitting in certain low chairs in  the [employer] , her volunteer work place.  Dr. Woody Seller  recently did some routine Labs, that are not available on EPIC.    Interval history from 09/17/2015, [name] is seen here today after her May 2017 first visit with me. She had an excellent comprehensive metabolic panel result no abnormalities of liver or kidney function. She tends to have higher potassium levels but she was also not diluting properly, meaning the patient was probably dehydrated to some degree at the time of the blood draw. I also checked her for JC virus and it was -0.21 JC antibody is considered negative or indeterminate. Theoretically, this patient could be using an oral medication for the treatment of MS and many of these are stronger than the injectable interferons. However her MRI from recent has shown no acute lesions and her history doesn't really speak of recent relapses. 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 I will add the necessary labs today which are also including a hepatitis panel and a gold blood test for tuberculosis. If these are negative I will really prefer her to have an infusion therapy. 09-17-2015  35 minute re-Assessment with ore than 50% of the time dedicated to face to face discussion and coordination of  care. :   Originally MS relapsing - remitting , but now  without relapse over 10 years . May enter the  progressive phase of the disease.  She will remain on Betaseron, not oral medication. 20 years .  She was not doing well on AMPYRA, her renal function declined..  Fatigue due to MS or sleepiness from nocturia and apnea?  Sleep study revealed mild AHI, tried CPAP but couldn't tolerate the sleep position. She sleeps usually prone/  Baclofen now 10 mg tid. Spasm are most bother some at night. She has refills.   PT re - evaluation , fall prevention to be re- discussed.   She uses a walker  .   Her urologist at [place] - she sees Dr. Gaynelle Arabian.  CMET was normal, JCV indeterminate, MRI without acute brain lesions.   I have the pleasure of seeing [name] today on 01/13/2016, who has not had any clinical symptoms of relapse since last seen in August 2017. She reports memory deficits however. Dr. Felecia Shelling had read her MRI and advised me in regards to possible starting OCREVUS. Her viral panel, hepatitis panel were all in normal limits. She would be a candidate. The question is if her disease will progress further or if percreta was could stop it. She may have ended secondary progressive MS at this point. She is currently on Betaseron , for about 20 years.  She will continue BETASERON, until [place] will be approved. She is very interested.      Review of Systems: Out of a complete 14 system review, the patient complains of only the following symptoms, and all other reviewed systems are negative.  Hip spasm, pain , non radiating , weight gain, fatigue,  Gait disorder, falls , urinary incontinence- urge - neurogenic bladder dysfunction. , nocturia.   MS - secondary progressive.  OCREVUS since 2018 - compliance with OCREVUS is much easier and the patient was doing well.   Montreal Cognitive Assessment  06/19/2019 01/13/2016  Visuospatial/ Executive (0/5) 4 5  Naming (0/3) 3 2  Attention: Read list  of digits (0/2) 2 2  Attention: Read list of letters (0/1) 1 1  Attention: Serial 7 subtraction starting at 100 (0/3) 3 2  Language: Repeat phrase (0/2) 2 2  Language : Fluency (0/1) 1 1  Abstraction (0/2) 2 2  Delayed Recall (0/5) 4 1  Orientation (0/6) 6 5  Total 28 23     MMSE - Mini Mental State Exam 01/17/2019 01/13/2018 06/17/2017  Orientation to time Orientation to Place Registration Attention/ Calculation Recall Language- name 2 objects Language- repeat Language- follow 3 step command Language- read & follow direction Write a sentence 1 0 0  Write a sentence-comments - no subject sentence didn't have subject  Copy design Copy design-comments named 8 animals - -  Total score Social History   Socioeconomic History  . Marital status: Single    Spouse name: Not on file  . Number of children: 0  . Years of education: college  . Highest education level: Not on file  Occupational History  . Occupation: disabled    Associate Professor: NOT EMPLOYED  Tobacco Use  . Smoking status: Never Smoker  . Smokeless tobacco: Never Used  Substance and Sexual Activity  . Alcohol use: No    Alcohol/week: 0.0 standard drinks    Comment: quit in 1975  . Drug use: No  . Sexual activity: Never    Birth control/protection: Post-menopausal  Other Topics Concern  . Not on file  Social History Narrative   Patient is single and lives alone.   Patient is right-handed.   Patient has a college education.   Patient does not drink any caffeine.   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   Social Determinants of Health   Financial Resource Strain:   . Difficulty of Paying Living Expenses:   Food Insecurity:   . Worried About Programme researcher, broadcasting/film/video in the Last Year:   . Barista in the Last Year:   Transportation Needs:   . Freight forwarder (Medical):   Marland Kitchen Lack of Transportation  (Non-Medical):   Physical Activity:   . Days of Exercise per Week:   . Minutes of Exercise per Session:   Stress:   . Feeling of Stress :   Social Connections:   . Frequency of Communication with Friends and Family:   . Frequency of Social Gatherings with Friends and Family:   . Attends Religious Services:   . Active Member of Clubs or Organizations:   . Attends Banker Meetings:   Marland Kitchen Marital Status:   Intimate Partner Violence:   . Fear of Current or Ex-Partner:   . Emotionally Abused:   Marland Kitchen Physically Abused:   . Sexually Abused:     Family History  Problem Relation Age of Onset  . Heart attack Mother   . Diabetes Mother   . Aneurysm Father   . Heart disease Father   . Cancer Maternal Grandmother        breast  . Colon cancer Neg Hx     Past Medical History:  Diagnosis Date  . Diabetes mellitus without complication (HCC)   . High cholesterol   . Hypertension   . Hypothyroidism   . Morbid obesity (HCC)   . MS (multiple sclerosis) (HCC) relapsing remitting  . Severe obesity (  BMI >= 40) (HCC) 10/11/2012    Past Surgical History:  Procedure Laterality Date  . BIOPSY  03/19/2015   Procedure: BIOPSY;  Surgeon: West Bali, MD;  Location: [facility];  Service: Endoscopy;;  random colon biopsy  . CHOLECYSTECTOMY  8/09  . COLONOSCOPY     about 7 years  . COLONOSCOPY WITH PROPOFOL N/A 03/19/2015   KGM:WNUUVO ileum and colon/small internal hemorrhoids  . LEFT OOPHORECTOMY Left 8/11    Current Outpatient Medications  Medication Sig Dispense Refill  . aspirin 81 MG tablet Take 81 mg by mouth daily.    Marland Kitchen atorvastatin (LIPITOR) 40 MG tablet Take 40 mg by mouth daily.     . B-D ULTRAFINE III SHORT PEN 31G X 8 MM MISC USE 1 PEN NEEDLE TWICE A DAY 180 each 4  . baclofen (LIORESAL) 10 MG tablet TAKE 1 TABLET (10 MG) IN THE MORNING AND 2 TABLETS (20 MG) IN THE EVENING 270 tablet 3  . Calcium Carbonate (CALCIUM 600 PO) Take 1 tablet by mouth daily.    Marland Kitchen  donepezil (ARICEPT) 5 MG tablet Take 1 tablet (5 mg total) by mouth at bedtime. 90 tablet 3  . Ergocalciferol 2000 units CAPS Take 2,000 capsules by mouth daily.     Marland Kitchen glucose blood (ONE TOUCH ULTRA TEST) test strip AS DIRECTED TO TEST BLOOD SUGAR FOUR TIMES DAILY 350 each 2  . glucose blood (ONE TOUCH ULTRA TEST) test strip Use as instructed 2 x daily. E11.65 One touch Ultra 200 each 5  . imipramine (TOFRANIL) 25 MG tablet TAKE 1 TABLET AT BEDTIME 90 tablet 3  . LANTUS SOLOSTAR 100 UNIT/ML Solostar Pen INJECT 60 UNITS UNDER THE SKIN DAILY WITH BREAKFAST 30 mL 3  . levothyroxine (SYNTHROID, LEVOTHROID) 25 MCG tablet Take 75 mcg daily before breakfast by mouth.    . metFORMIN (GLUCOPHAGE-XR) 500 MG 24 hr tablet TAKE 1 TABLET TWICE A DAY 180 tablet 3  . methenamine (HIPREX) 1 g tablet 2 (two) times daily with a meal.     . RELION PEN NEEDLES 32G X 4 MM MISC     . Tamsulosin HCl (FLOMAX) 0.4 MG CAPS Take 0.4 mg by mouth daily after breakfast.     . tolterodine (DETROL LA) 4 MG 24 hr capsule TAKE 1 CAPSULE DAILY 90 capsule 3  . TOPROL XL 25 MG 24 hr tablet Take 25 mg by mouth daily.     . TRULICITY 0.75 MG/0.5ML SOPN INJECT 0.5 ML (0.75 MG) UNDER THE SKIN ONCE WEEKLY 6 mL 3   No current facility-administered medications for this visit.    Allergies as of 06/19/2019 - Review Complete 06/19/2019  Allergen Reaction Noted  . Penicillins Anaphylaxis 10/07/2011  . Lotensin [benazepril hcl] Cough 03/11/2015   patient lost 50 pounds since 2016 .  Vitals: BP 132/84   Pulse 75   Ht 5\' 4"  (1.626 m)   Wt 228 lb (103.4 kg)   BMI 39.14 kg/m  Last Weight:  Wt Readings from Last 1 Encounters:  06/19/19 228 lb (103.4 kg)   Last Height:   Ht Readings from Last 1 Encounters:  06/19/19 5\' 4"  (1.626 m)    Physical exam:  Skin - edema over both ankles, severe.  General: The patient is awake, alert and appears not in acute distress. The patient is well groomed. She is right handed.  Head:  Normocephalic, atraumatic.  Neck is supple. Mallampati 3 , neck circumference: 15 inches , no retrognathia, no retainers, no  TMJ, no nasal deviation  Cardiovascular:  Regular rate and rhythm, without murmurs or carotid bruit, and without distended neck veins. Respiratory: Lungs are clear to auscultation.Skin:  Without evidence of edema, or rash. Trunk: BMI remains elevated. Neurologic exam :The patient is awake and alert, oriented to place and time.   Memory subjective described as impaired- she is not longer. Slow, she is in company and daily stimulated, she has done crosswords, reading club,  Her speech is no longer mildly dysarthric. Intact attention span & concentration ability.Speech is fluent without  dysarthria, dysphonia-   Mood and affect are appropriate, she is alert , conversant and much excited about moving soon.   Montreal Cognitive Assessment  06/19/2019 01/13/2016  Visuospatial/ Executive (0/5) 4 5  Naming (0/3) 3 2  Attention: Read list of digits (0/2) 2 2  Attention: Read list of letters (0/1) 1 1  Attention: Serial 7 subtraction starting at 100 (0/3) 3 2  Language: Repeat phrase (0/2) 2 2  Language : Fluency (0/1) 1 1  Abstraction (0/2) 2 2  Delayed Recall (0/5) 4 1  Orientation (0/6) 6 5  Total 28 23   MOCA   Cranial nerves:  Smell and taste is preserved. Pupils are equal in size, and reacting sluggishlyto light. No reaction to accomodation. INO .  Funduscopic exam deferred. No history of optic neuritis.  Reports horizontal diplopia.  Extraocular movements  in vertical and horizontal planes intact and without nystagmus. Visual fields by finger perimetry are intact. Hearing to  Tuning fork intact.  Facial sensation intact to fine touch. Facial motor strength is symmetric and tongue and uvula move midline. Motor exam: Normal tone and normal muscle bulk and symmetric normal strength in all extremities. Sensory:  Fine touch, pinprick and vibration were present in all  extremities. Proprioception in upper extremities is normal. Coordination: Finger-to-nose maneuver without evidence of ataxia, but with dysmetria (no tremor).  Reports right leg coordination difficulties, but not handwriting changes.  The patient has trouble lifting the right foot and adducting/ flexing the right hip. Spasticity . Patient walkswith her walker assistive device, a walker with built- in seat   She reports not using the seat much.  Deep tendon reflexes: in the upper and lower extremities are very brisk .   Right handed, with brisker reflexes on the right. Babinski maneuver response is up going right, and down going left .    Recent MRI findings.   FINDINGS:   No abnormal lesions are seen on diffusion-weighted views to suggest acute ischemia. The cortical sulci, fissures and cisterns are normal in size and appearance. Lateral, third and fourth ventricle are normal in size and appearance. No extra-axial fluid collections are seen. No evidence of mass effect or midline shift.    Multiple round and ovoid periventricular, subcortical, pericallosal, juxtacortical chronic demyelinating plaques.  2 subtle enhancing lesions noted within the pons, may represent acute demyelinating plaques (series 17; images 40 and 51).  No other abnormal lesions are seen on post contrast views.    On sagittal views the posterior fossa, pituitary gland and corpus callosum are unremarkable. No evidence of intracranial hemorrhage on SWI views. The orbits and their contents, paranasal sinuses and calvarium are unremarkable.  Intracranial flow voids are present.   IMPRESSION:   MRI brain with and without contrast demonstrating: -Multiple supratentorial chronic demyelinating plaques; stable from 2018.  -There are 2 subtle new enhancing lesions noted within the pons, may represent acute demyelinating plaques.  INTERPRETING PHYSICIAN:  Suanne Marker, MD Certified in Neurology,  Neurophysiology and Neuroimaging  [facility] [address], [address] [place], [place] [address] [phone]     I discussed with [name] and  [name]  The continuation of OCREVUS therapy in [place], in light of Syleena's cognitive recovery, her depression recovery and her secondary progressive MS therapy.  She started OCREVUS 01-21-2017 - now in year 3.  Memory to be retested by [facility] , next visit in 4-5 month with NP - prefers  a Friday visit-.   Melvyn Novas, MD  [facility] [address], [address] [place], [place] [address] [phone]

## 2019-06-19 NOTE — Patient Instructions (Signed)
Ocrelizumab injection °What is this medicine? °OCRELIZUMAB (ok re LIZ ue mab) treats multiple sclerosis. It helps to decrease the number of multiple sclerosis relapses. It is not a cure. °This medicine may be used for other purposes; ask your health care provider or pharmacist if you have questions. °COMMON BRAND NAME(S): OCREVUS °What should I tell my health care provider before I take this medicine? °They need to know if you have any of these conditions: °· cancer °· hepatitis B infection °· other infection (especially a virus infection such as chickenpox, cold sores, or herpes) °· an unusual or allergic reaction to ocrelizumab, other medicines, foods, dyes or preservatives °· pregnant or trying to get pregnant °· breast-feeding °How should I use this medicine? °This medicine is for infusion into a vein. It is given by a health care professional in a hospital or clinic setting. °A special MedGuide will be given to you before each treatment. Be sure to read this information carefully each time. °Talk to your pediatrician regarding the use of this medicine in children. Special care may be needed. °Overdosage: If you think you have taken too much of this medicine contact a poison control center or emergency room at once. °NOTE: This medicine is only for you. Do not share this medicine with others. °What if I miss a dose? °Keep appointments for follow-up doses as directed. It is important not to miss your dose. Call your doctor or health care professional if you are unable to keep an appointment. °What may interact with this medicine? °· alemtuzumab °· daclizumab °· dimethyl fumarate °· fingolimod °· glatiramer °· interferon beta °· live virus vaccines °· mitoxantrone °· natalizumab °· peginterferon beta °· rituximab °· steroid medicines like prednisone or cortisone °· teriflunomide °This list may not describe all possible interactions. Give your health care provider a list of all the medicines, herbs,  non-prescription drugs, or dietary supplements you use. Also tell them if you smoke, drink alcohol, or use illegal drugs. Some items may interact with your medicine. °What should I watch for while using this medicine? °Tell your doctor or healthcare professional if your symptoms do not start to get better or if they get worse. °This medicine can cause serious allergic reactions. To reduce your risk you may need to take medicine before treatment with this medicine. Take your medicine as directed. °Women should inform their doctor if they wish to become pregnant or think they might be pregnant. There is a potential for serious side effects to an unborn child. Talk to your health care professional or pharmacist for more information. Female patients should use effective birth control methods while receiving this medicine and for 6 months after the last dose. °Call your doctor or health care professional for advice if you get a fever, chills or sore throat, or other symptoms of a cold or flu. Do not treat yourself. This drug decreases your body's ability to fight infections. Try to avoid being around people who are sick. °If you have a hepatitis B infection or a history of a hepatitis B infection, talk to your doctor. The symptoms of hepatitis B may get worse if you take this medicine. °In some patients, this medicine may cause a serious brain infection that may cause death. If you have any problems seeing, thinking, speaking, walking, or standing, tell your doctor right away. If you cannot reach your doctor, urgently seek other source of medical care. °This medicine can decrease the response to a vaccine. If you need to get   vaccinated, tell your healthcare professional if you have received this medicine. Extra booster doses may be needed. Talk to your doctor to see if a different vaccination schedule is needed. °Talk to your doctor about your risk of cancer. You may be more at risk for certain types of cancers if you  take this medicine. °What side effects may I notice from receiving this medicine? °Side effects that you should report to your doctor or health care professional as soon as possible: °· allergic reactions like skin rash, itching or hives, swelling of the face, lips, or tongue °· breathing problems °· facial flushing °· fast, irregular heartbeat °· lump or soreness in the breast °· signs and symptoms of herpes such as cold sore, shingles, or genital sores °· signs and symptoms of infection like fever or chills, cough, sore throat, pain or trouble passing urine °· signs and symptoms of low blood pressure like dizziness; feeling faint or lightheaded, falls; unusually weak or tired °· signs and symptoms of progressive multifocal leukoencephalopathy (PML) like changes in vision; clumsiness; confusion; personality changes; weakness on one side of the body °· swelling of the ankles, feet, hands °Side effects that usually do not require medical attention (report these to your doctor or health care professional if they continue or are bothersome): °· back pain °· depressed mood °· diarrhea °· pain, redness, or irritation at site where injected °This list may not describe all possible side effects. Call your doctor for medical advice about side effects. You may report side effects to FDA at 1-800-FDA-1088. °Where should I keep my medicine? °This drug is given in a hospital or clinic and will not be stored at home. °NOTE: This sheet is a summary. It may not cover all possible information. If you have questions about this medicine, talk to your doctor, pharmacist, or health care provider. °© 2020 Elsevier/Gold Standard (2018-01-17 07:41:53) ° °

## 2019-06-20 ENCOUNTER — Telehealth: Payer: Self-pay | Admitting: Neurology

## 2019-06-20 LAB — COMPREHENSIVE METABOLIC PANEL
ALT: 13 IU/L (ref 0–32)
AST: 10 IU/L (ref 0–40)
Albumin/Globulin Ratio: 1.5 (ref 1.2–2.2)
Albumin: 4.1 g/dL (ref 3.8–4.8)
Alkaline Phosphatase: 104 IU/L (ref 48–121)
BUN/Creatinine Ratio: 34 — ABNORMAL HIGH (ref 12–28)
BUN: 31 mg/dL — ABNORMAL HIGH (ref 8–27)
Bilirubin Total: 0.2 mg/dL (ref 0.0–1.2)
CO2: 24 mmol/L (ref 20–29)
Calcium: 9.7 mg/dL (ref 8.7–10.3)
Chloride: 103 mmol/L (ref 96–106)
Creatinine, Ser: 0.9 mg/dL (ref 0.57–1.00)
GFR calc Af Amer: 80 mL/min/{1.73_m2} (ref >59)
GFR calc non Af Amer: 69 mL/min/{1.73_m2} (ref >59)
Globulin, Total: 2.8 g/dL (ref 1.5–4.5)
Glucose: 130 mg/dL — ABNORMAL HIGH (ref 65–99)
Potassium: 4.9 mmol/L (ref 3.5–5.2)
Sodium: 143 mmol/L (ref 134–144)
Total Protein: 6.9 g/dL (ref 6.0–8.5)

## 2019-06-20 LAB — CBC WITH DIFFERENTIAL/PLATELET
Basophils Absolute: 0.1 10*3/uL (ref 0.0–0.2)
Basos: 1 %
EOS (ABSOLUTE): 0.2 10*3/uL (ref 0.0–0.4)
Eos: 2 %
Hematocrit: 42.5 % (ref 34.0–46.6)
Hemoglobin: 13.7 g/dL (ref 11.1–15.9)
Immature Grans (Abs): 0 10*3/uL (ref 0.0–0.1)
Immature Granulocytes: 0 %
Lymphocytes Absolute: 1.6 10*3/uL (ref 0.7–3.1)
Lymphs: 16 %
MCH: 27.3 pg (ref 26.6–33.0)
MCHC: 32.2 g/dL (ref 31.5–35.7)
MCV: 85 fL (ref 79–97)
Monocytes Absolute: 0.9 10*3/uL (ref 0.1–0.9)
Monocytes: 9 %
Neutrophils Absolute: 7.3 10*3/uL — ABNORMAL HIGH (ref 1.4–7.0)
Neutrophils: 72 %
Platelets: 373 10*3/uL (ref 150–450)
RBC: 5.01 x10E6/uL (ref 3.77–5.28)
RDW: 13.2 % (ref 11.7–15.4)
WBC: 10.1 10*3/uL (ref 3.4–10.8)

## 2019-06-20 NOTE — Telephone Encounter (Signed)
-----   Message from Melvyn Novas, MD sent at 06/20/2019 12:40 PM EDT ----- Secondary progressive MS patient , non fast blood tests. BUN again very high- hydration is encouraged. Liver and kidney function were normal.

## 2019-06-20 NOTE — Telephone Encounter (Signed)
Called the patient's sister, POA and was able to review the lab results with her. She verbalized understanding and had no questions. She did want to ask if the MRI's that were ordered could be completed near [facility] area. Advised I would pass this along to MRI scheduler. She was appreciative.

## 2019-06-20 NOTE — Progress Notes (Signed)
Secondary progressive MS patient , non fast blood tests. BUN again very high- hydration is encouraged. Liver and kidney function were normal.

## 2019-06-22 NOTE — Telephone Encounter (Signed)
Patient is scheduled at [facility] for Thursday 07/13/19 arrival time is 10:30 AM. Pam on her DPR is aware of time and day. I also gave her their number of [phone] incase they needed to r/s.  Medicare no auth.

## 2019-06-25 DIAGNOSIS — I10 Essential (primary) hypertension: Secondary | ICD-10-CM | POA: Diagnosis not present

## 2019-06-25 DIAGNOSIS — E119 Type 2 diabetes mellitus without complications: Secondary | ICD-10-CM | POA: Diagnosis not present

## 2019-06-25 DIAGNOSIS — E7800 Pure hypercholesterolemia, unspecified: Secondary | ICD-10-CM | POA: Diagnosis not present

## 2019-07-01 ENCOUNTER — Other Ambulatory Visit: Payer: Self-pay | Admitting: Neurology

## 2019-07-13 ENCOUNTER — Ambulatory Visit (HOSPITAL_COMMUNITY): Payer: Medicare Other

## 2019-07-14 ENCOUNTER — Other Ambulatory Visit: Payer: Self-pay

## 2019-07-14 ENCOUNTER — Ambulatory Visit (HOSPITAL_COMMUNITY)
Admission: RE | Admit: 2019-07-14 | Discharge: 2019-07-14 | Disposition: A | Discharge status: Home / Self Care | Payer: Medicare Other | Source: Ambulatory Visit | Attending: Neurology | Admitting: Neurology

## 2019-07-14 DIAGNOSIS — G3184 Mild cognitive impairment, so stated: Secondary | ICD-10-CM

## 2019-07-14 DIAGNOSIS — G35 Multiple sclerosis: Secondary | ICD-10-CM | POA: Diagnosis present

## 2019-07-14 DIAGNOSIS — Z79899 Other long term (current) drug therapy: Secondary | ICD-10-CM | POA: Insufficient documentation

## 2019-07-14 MED ORDER — GADOBUTROL 1 MMOL/ML IV SOLN
10.0000 mL | Freq: Once | INTRAVENOUS | Status: AC | PRN
  Administered 2019-07-14: 10 mL via INTRAVENOUS

## 2019-07-15 ENCOUNTER — Other Ambulatory Visit: Payer: Self-pay | Admitting: "Endocrinology

## 2019-07-15 DIAGNOSIS — N182 Chronic kidney disease, stage 2 (mild): Secondary | ICD-10-CM

## 2019-07-17 NOTE — Progress Notes (Signed)
MRI CERVICAL SPINE IMPRESSION:  1. Normal MRI appearance of the cervical spinal cord. No evidence for demyelinating disease identified. No abnormal enhancement. 2. Moderate sized central disc protrusion at C3-4 with secondary mild spinal stenosis and flattening of the ventral spinal cord. 3. Additional small central disc protrusions at C2-3, C4-5, and C5-6 with no more than mild spinal stenosis but no cord deformity. 4. Mild-to-moderate left greater than right C4 and C5 foraminal stenosis related to disc bulge and uncovertebral disease.  MRI THORACIC SPINE IMPRESSION:  1. Single subtle focus of T2 signal abnormality involving the left dorsal cord at the level of T3, suspicious for a demyelinating plaque. No abnormal enhancement to suggest active demyelination. No other convincing cord signal changes seen within the thoracic spinal cord. 2. Small disc protrusions at T7-8, T9-10, and T10-11 as above without significant stenosis.

## 2019-07-20 ENCOUNTER — Ambulatory Visit: Payer: Medicare Other | Admitting: "Endocrinology

## 2019-07-21 ENCOUNTER — Encounter: Payer: Self-pay | Admitting: "Endocrinology

## 2019-07-21 ENCOUNTER — Ambulatory Visit (INDEPENDENT_AMBULATORY_CARE_PROVIDER_SITE_OTHER): Payer: Medicare Other | Admitting: "Endocrinology

## 2019-07-21 ENCOUNTER — Other Ambulatory Visit: Payer: Self-pay

## 2019-07-21 VITALS — BP 125/78 | HR 84 | Ht 64.0 in | Wt 230.4 lb

## 2019-07-21 DIAGNOSIS — N182 Chronic kidney disease, stage 2 (mild): Secondary | ICD-10-CM

## 2019-07-21 DIAGNOSIS — E1122 Type 2 diabetes mellitus with diabetic chronic kidney disease: Secondary | ICD-10-CM | POA: Diagnosis not present

## 2019-07-21 DIAGNOSIS — Z794 Long term (current) use of insulin: Secondary | ICD-10-CM | POA: Diagnosis not present

## 2019-07-21 DIAGNOSIS — E039 Hypothyroidism, unspecified: Secondary | ICD-10-CM

## 2019-07-21 DIAGNOSIS — E782 Mixed hyperlipidemia: Secondary | ICD-10-CM | POA: Diagnosis not present

## 2019-07-21 DIAGNOSIS — I10 Essential (primary) hypertension: Secondary | ICD-10-CM | POA: Diagnosis not present

## 2019-07-21 LAB — POCT GLYCOSYLATED HEMOGLOBIN (HGB A1C): Hemoglobin A1C: 7.2 % — AB (ref 4.0–5.6)

## 2019-07-21 MED ORDER — ACCU-CHEK GUIDE VI STRP: ORAL_STRIP | 2 refills | Status: DC

## 2019-07-21 MED ORDER — ACCU-CHEK GUIDE W/DEVICE KIT: 1.0000 | PACK | 0 refills | Status: DC

## 2019-07-21 NOTE — Patient Instructions (Signed)

## 2019-07-21 NOTE — Progress Notes (Signed)
07/21/2019             Endocrinology follow-up note   Subjective:    Patient ID: [name], female    DOB: 12-12-1955,  Glenda Chroman, MD   Past Medical History:  Diagnosis Date  . Diabetes mellitus without complication ([place])   . High cholesterol   . Hypertension   . Hypothyroidism   . Morbid obesity ([place])   . MS (multiple sclerosis) ([facility]) relapsing remitting  . Severe obesity (BMI >= 40) ([place]) 10/11/2012   Past Surgical History:  Procedure Laterality Date  . BIOPSY  03/19/2015   Procedure: BIOPSY;  Surgeon: Danie Binder, MD;  Location: [facility];  Service: Endoscopy;;  random colon biopsy  . CHOLECYSTECTOMY  8/09  . COLONOSCOPY     about 7 years  . COLONOSCOPY WITH PROPOFOL N/A 03/19/2015   QIW:[id number] ileum and colon/small internal hemorrhoids  . LEFT OOPHORECTOMY Left 8/11   Social History   Socioeconomic History  . Marital status: Single    Spouse name: Not on file  . Number of children: 0  . Years of education: college  . Highest education level: Not on file  Occupational History  . Occupation: disabled    Fish farm manager: NOT EMPLOYED  Tobacco Use  . Smoking status: Never Smoker  . Smokeless tobacco: Never Used  Substance and Sexual Activity  . Alcohol use: No    Alcohol/week: 0.0 standard drinks    Comment: quit in 1975  . Drug use: No  . Sexual activity: Never    Birth control/protection: Post-menopausal  Other Topics Concern  . Not on file  Social History Narrative   Patient is single and lives alone.   Patient is right-handed.   Patient has a college education.   Patient does not drink any caffeine.   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   Social Determinants of Health   Financial Resource Strain:   . Difficulty of Paying Living Expenses:   Food Insecurity:   . Worried About Charity fundraiser in the Last Year:   . Arboriculturist in the Last Year:   Transportation Needs:   . Film/video editor (Medical):   Marland Kitchen Lack  of Transportation (Non-Medical):   Physical Activity:   . Days of Exercise per Week:   . Minutes of Exercise per Session:   Stress:   . Feeling of Stress :   Social Connections:   . Frequency of Communication with Friends and Family:   . Frequency of Social Gatherings with Friends and Family:   . Attends Religious Services:   . Active Member of Clubs or Organizations:   . Attends Archivist Meetings:   Marland Kitchen Marital Status:    Outpatient Encounter Medications as of 07/21/2019  Medication Sig  . aspirin 81 MG tablet Take 81 mg by mouth daily.  Marland Kitchen atorvastatin (LIPITOR) 40 MG tablet Take 40 mg by mouth daily.   . B-D ULTRAFINE III SHORT PEN 31G X 8 MM MISC USE 1 PEN NEEDLE TWICE A DAY  . baclofen (LIORESAL) 10 MG tablet TAKE 1 TABLET (10 MG) IN THE MORNING AND 2 TABLETS (20 MG) IN THE EVENING  . Blood Glucose Monitoring Suppl (ACCU-CHEK GUIDE) w/Device KIT 1 Piece by Does not apply route as directed.  . Calcium Carbonate (CALCIUM 600 PO) Take 1 tablet by mouth daily.  Marland Kitchen donepezil (ARICEPT) 5 MG tablet Take 1 tablet (5 mg total) by mouth  at bedtime.  . Ergocalciferol 2000 units CAPS Take 2,000 capsules by mouth daily.   Marland Kitchen glucose blood (ACCU-CHEK GUIDE) test strip Use as instructed  . imipramine (TOFRANIL) 25 MG tablet TAKE 1 TABLET AT BEDTIME  . LANTUS SOLOSTAR 100 UNIT/ML Solostar Pen INJECT 60 UNITS UNDER THE SKIN DAILY WITH BREAKFAST  . levothyroxine (SYNTHROID, LEVOTHROID) 25 MCG tablet Take 75 mcg daily before breakfast by mouth.  . metFORMIN (GLUCOPHAGE-XR) 500 MG 24 hr tablet TAKE 1 TABLET TWICE A DAY  . methenamine (HIPREX) 1 g tablet 2 (two) times daily with a meal.   . RELION PEN NEEDLES 32G X 4 MM MISC   . Tamsulosin HCl (FLOMAX) 0.4 MG CAPS Take 0.4 mg by mouth daily after breakfast.   . tolterodine (DETROL LA) 4 MG 24 hr capsule TAKE 1 CAPSULE DAILY  . TOPROL XL 25 MG 24 hr tablet Take 25 mg by mouth daily.   . TRULICITY 5.99 HF/4.1SE SOPN INJECT 0.5 ML (0.75 MG)  UNDER THE SKIN ONCE WEEKLY  . [DISCONTINUED] glucose blood (ONE TOUCH ULTRA TEST) test strip AS DIRECTED TO TEST BLOOD SUGAR FOUR TIMES DAILY  . [DISCONTINUED] glucose blood (ONE TOUCH ULTRA TEST) test strip Use as instructed 2 x daily. E11.65 One touch Ultra   No facility-administered encounter medications on file as of 07/21/2019.   ALLERGIES: Allergies  Allergen Reactions  . Penicillins Anaphylaxis    Has patient had a PCN reaction causing immediate rash, facial/tongue/throat swelling, SOB or lightheadedness with hypotension: Yes Has patient had a PCN reaction causing severe rash involving mucus membranes or skin necrosis: No Has patient had a PCN reaction that required hospitalization No Has patient had a PCN reaction occurring within the last 10 years: No If all of the above answers are "NO", then may proceed with Cephalosporin use.   Marland Kitchen Lotensin [Benazepril Hcl] Cough   VACCINATION STATUS: Immunization History  Administered Date(s) Administered  . Influenza,inj,Quad PF,6+ Mos 10/09/2018    Diabetes She presents for her follow-up diabetic visit. She has type 2 diabetes mellitus. Onset time: She was diagnosed at approximate age of 33 years. Her disease course has been stable. There are no hypoglycemic associated symptoms. Pertinent negatives for hypoglycemia include no confusion, headaches, pallor or seizures. Associated symptoms include fatigue. Pertinent negatives for diabetes include no chest pain, no polydipsia, no polyphagia and no polyuria. There are no hypoglycemic complications. Symptoms are stable. Diabetic complications include nephropathy and peripheral neuropathy. Risk factors for coronary artery disease include diabetes mellitus, dyslipidemia, hypertension and sedentary lifestyle. Hanson diabetic treatment includes insulin injections and oral agent (monotherapy). She is compliant with treatment most of the time. She is following a generally unhealthy diet. She has had a  previous visit with a dietitian. She never participates in exercise. There is no change in her home blood glucose trend. Her breakfast blood glucose range is generally 130-140 mg/dl. Her bedtime blood glucose range is generally 130-140 mg/dl. Her overall blood glucose range is 130-140 mg/dl. An ACE inhibitor/angiotensin II receptor blocker is being taken.  Thyroid Problem Presents for follow-up visit. Symptoms include fatigue. Patient reports no cold intolerance, diarrhea, heat intolerance or palpitations. The symptoms have been improving. Past treatments include levothyroxine. Her past medical history is significant for diabetes and hyperlipidemia.  Hyperlipidemia This is a chronic problem. The Hanson episode started more than 1 year ago. Exacerbating diseases include diabetes, hypothyroidism and obesity. Pertinent negatives include no chest pain, myalgias or shortness of breath. Hanson antihyperlipidemic treatment includes statins. Risk factors  for coronary artery disease include diabetes mellitus, dyslipidemia, hypertension, obesity, a sedentary lifestyle and post-menopausal.  Hypertension This is a chronic problem. The Hanson episode started more than 1 year ago. Pertinent negatives include no chest pain, headaches, palpitations or shortness of breath. Risk factors for coronary artery disease include dyslipidemia, diabetes mellitus, obesity, sedentary lifestyle, family history and post-menopausal state. Past treatments include angiotensin blockers. Identifiable causes of hypertension include a thyroid problem.   Review of systems: Limited as above.    Objective:    BP 125/78   Pulse 84   Ht '5\' 4"'  (1.626 m)   Wt 230 lb 6.4 oz (104.5 kg)   BMI 39.55 kg/m   Wt Readings from Last 3 Encounters:  07/21/19 230 lb 6.4 oz (104.5 kg)  06/19/19 228 lb (103.4 kg)  01/17/19 230 lb 9.6 oz (104.6 kg)     Physical Exam- Limited  Constitutional:  Body mass index is 39.55 kg/m. , not in acute  distress, normal state of mind Eyes:  EOMI, no exophthalmos Neck: Supple Thyroid: No gross goiter Respiratory: Adequate breathing efforts Musculoskeletal:  + Walks with a walker related to her MS/disequilibrium.   movements Skin:  no rashes, no hyperemia Neurological: no tremor with outstretched hands,    Results for orders placed or performed in visit on 07/21/19  HgB A1c  Result Value Ref Range   Hemoglobin A1C 7.2 (A) 4.0 - 5.6 %   HbA1c POC (<> result, manual entry)     HbA1c, POC (prediabetic range)     HbA1c, POC (controlled diabetic range)     Complete Blood Count (Most recent): Lab Results  Component Value Date   WBC 10.1 06/19/2019   HGB 13.7 06/19/2019   HCT 42.5 06/19/2019   MCV 85 06/19/2019   PLT 373 06/19/2019   Chemistry (most recent): Lab Results  Component Value Date   NA 143 06/19/2019   K 4.9 06/19/2019   CL 103 06/19/2019   CO2 24 06/19/2019   BUN 31 (H) 06/19/2019   CREATININE 0.90 06/19/2019   Diabetic Labs (most recent): Lab Results  Component Value Date   HGBA1C 7.2 (A) 07/21/2019   HGBA1C 6.8 (H) 01/16/2019   HGBA1C 6.5 (H) 06/21/2018   Lipid Panel     Component Value Date/Time   CHOL 171 09/20/2017 0000   TRIG 115 09/20/2017 0000   HDL 57 09/20/2017 0000   CHOLHDL 2.7 03/17/2016 1228   VLDL 23 03/17/2016 1228   LDLCALC 91 09/20/2017 0000    Assessment & Plan:   1. Type 2 diabetes mellitus with stage 2 chronic kidney disease, with long-term Hanson use of insulin (HCC)  -Her CKD has reversed,  patient remains at a high risk for more acute and chronic complications of diabetes which include CAD, CVA, CKD, retinopathy, and neuropathy. These are all discussed in detail with the patient. - Lately,  she is getting a big help from her sisters , Jeannene Patella and Maudie Mercury.  - They make sure that Scottlynn gets her medications especially the morning injections of insulin.   Her glycemic profile is reviewed and showing near target profile both fasting and  postprandial.  Her point-of-care A1c 7.2%, remaining stable.   -  Suggestion is made for her to avoid simple carbohydrates  from her diet including Cakes, Sweet Desserts / Pastries, Ice Cream, Soda (diet and regular), Sweet Tea, Candies, Chips, Cookies, Sweet Pastries,  Store Bought Juices, Alcohol in Excess of  1-2 drinks a day, Artificial Sweeteners, Coffee Creamer,  and "Sugar-free" Products. This will help patient to have stable blood glucose profile and potentially avoid unintended weight gain.   - Patient is advised to stick to a routine mealtimes to eat 3 meals  a day and avoid unnecessary snacks ( to snack only to correct hypoglycemia).  - I have approached patient with the following individualized plan to manage diabetes and patient agrees.  -Based on her reported glycemic profile which are on target, she will continue to benefit from simplified treatment approach. -She is advised to continue Lantus 60 units every morning at breakfast, continue Trulicity 5.73 milligrams subcutaneously weekly.  She will continue to benefit from metformin.  She is advised to continue Metformin 500 mg p.o. twice daily.  -Target numbers for A1c, LDL, HDL, Triglycerides - discussed in detail.   - Patient specific target  for A1c; LDL, HDL, Triglycerides, and  Waist Circumference were discussed in detail.  2) BP/HTN: She has well-controlled blood pressure.  She is advised to continue Hanson medications including Toprol 25 mg p.o. daily .  3) Lipids/HPL: Her recent lipid panel showed uncontrolled LDL at 91.  She is currently on atorvastatin 40 mg p.o. nightly.     4)  Weight/Diet: CDE consult in progress, exercise, and carbohydrates information provided.  5)  Primary hypothyroidism -Her previsit thyroid function tests are consistent with appropriate replacement. -He is advised to continue levothyroxine 75 mcg p.o. every morning.   - We discussed about the correct intake of her thyroid hormone, on empty  stomach at fasting, with water, separated by at least 30 minutes from breakfast and other medications,  and separated by more than 4 hours from calcium, iron, multivitamins, acid reflux medications (PPIs). -Patient is made aware of the fact that thyroid hormone replacement is needed for life, dose to be adjusted by periodic monitoring of thyroid function tests.   6) Chronic Care/Health Maintenance:  -Patient is on ACEI/ARB and Statin medications and encouraged to continue to follow up with Ophthalmology, Podiatrist at least yearly or according to recommendations, and advised to  stay away from smoking. I have recommended yearly flu vaccine and pneumonia vaccination at least every 5 years; and  sleep for at least 7 hours a day.  -Patient is planning to move to [place], may return for 1 last time before she moves.  She is advised to establish with primary care doctor and endocrinologist in her new city.  I advised patient to maintain close follow up with her PCP for primary care needs.   - Time spent on this patient care encounter:  35 min, of which > 50% was spent in  counseling and the rest reviewing her blood glucose logs , discussing her hypoglycemia and hyperglycemia episodes, reviewing her Hanson and  previous labs / studies  ( including abstraction from other facilities) and medications  doses and developing a  long term treatment plan and documenting her care.   Please refer to Patient Instructions for Blood Glucose Monitoring and Insulin/Medications Dosing Guide"  in media tab for additional information. Please  also refer to " Patient Self Inventory" in the Media  tab for reviewed elements of pertinent patient history.  [name] participated in the discussions, expressed understanding, and voiced agreement with the above plans.  All questions were answered to her satisfaction. she is encouraged to contact clinic should she have any questions or concerns prior to her return  visit.   Follow up plan: Return in about 3 months (around 10/21/2019) for F/U with  Pre-visit Labs, Meter, Logs, A1c here.Glade Lloyd, MD Phone: [phone]  Fax: [phone]   07/21/2019, 11:34 AM

## 2019-07-24 ENCOUNTER — Telehealth: Payer: Self-pay | Admitting: "Endocrinology

## 2019-07-24 ENCOUNTER — Other Ambulatory Visit: Payer: Self-pay

## 2019-07-24 DIAGNOSIS — E1122 Type 2 diabetes mellitus with diabetic chronic kidney disease: Secondary | ICD-10-CM

## 2019-07-24 MED ORDER — ACCU-CHEK GUIDE VI STRP: ORAL_STRIP | 2 refills | Status: AC

## 2019-07-24 MED ORDER — TRULICITY 0.75 MG/0.5ML ~~LOC~~ SOAJ: SUBCUTANEOUS | 1 refills | Status: DC

## 2019-07-24 MED ORDER — ACCU-CHEK GUIDE W/DEVICE KIT: 1.0000 | PACK | 0 refills | Status: AC

## 2019-07-24 NOTE — Telephone Encounter (Signed)
Notified pt of this. She also states that [facility] is saying they need a CMM form for the monitor, the strips are ready. She wants a call back

## 2019-07-24 NOTE — Telephone Encounter (Signed)
Returned call, all taken care of

## 2019-07-24 NOTE — Telephone Encounter (Signed)
Pt's sister, Pam left a VM that she only received 30 days of [facility] and it has to be 90 days. Please advise. She would like a call back

## 2019-07-24 NOTE — Telephone Encounter (Signed)
Returned call to pt, [facility] has not sent a form to [place] to fill out, pt will contact [facility] again.

## 2019-07-25 ENCOUNTER — Encounter: Payer: Self-pay | Admitting: Neurology

## 2019-07-25 NOTE — Telephone Encounter (Signed)
Pam (sister) is calling to check the status of this. She would like a call back.

## 2019-07-25 NOTE — Telephone Encounter (Signed)
Spoke with Lorin Picket at [employer], gave fax # for him to resend again, will call Pam back once we have the form.

## 2019-07-26 DIAGNOSIS — E7800 Pure hypercholesterolemia, unspecified: Secondary | ICD-10-CM | POA: Diagnosis not present

## 2019-07-26 DIAGNOSIS — E119 Type 2 diabetes mellitus without complications: Secondary | ICD-10-CM | POA: Diagnosis not present

## 2019-07-26 DIAGNOSIS — I10 Essential (primary) hypertension: Secondary | ICD-10-CM | POA: Diagnosis not present

## 2019-07-27 ENCOUNTER — Other Ambulatory Visit: Payer: Self-pay

## 2019-07-27 DIAGNOSIS — E1122 Type 2 diabetes mellitus with diabetic chronic kidney disease: Secondary | ICD-10-CM

## 2019-07-27 MED ORDER — BLOOD GLUCOSE MONITOR KIT: 1.0000 | PACK | Freq: Two times a day (BID) | 0 refills | Status: AC

## 2019-08-01 NOTE — Telephone Encounter (Signed)
Joy, patient's sister, Elita Quick would like a call back

## 2019-08-04 NOTE — Telephone Encounter (Signed)
Left pt's caregiver a message stating we did get the CMN forms needed and documentation along with forms were faxed to [facility] Document Audit/Reveiw Team.

## 2019-08-06 ENCOUNTER — Other Ambulatory Visit: Payer: Self-pay | Admitting: "Endocrinology

## 2019-08-11 ENCOUNTER — Other Ambulatory Visit: Payer: Self-pay | Admitting: Neurology

## 2019-08-21 ENCOUNTER — Ambulatory Visit: Payer: Self-pay | Admitting: Neurology

## 2019-08-25 DIAGNOSIS — I10 Essential (primary) hypertension: Secondary | ICD-10-CM | POA: Diagnosis not present

## 2019-08-25 DIAGNOSIS — E7800 Pure hypercholesterolemia, unspecified: Secondary | ICD-10-CM | POA: Diagnosis not present

## 2019-08-25 DIAGNOSIS — E119 Type 2 diabetes mellitus without complications: Secondary | ICD-10-CM | POA: Diagnosis not present

## 2019-08-28 DIAGNOSIS — G35 Multiple sclerosis: Secondary | ICD-10-CM | POA: Diagnosis not present

## 2019-09-06 ENCOUNTER — Other Ambulatory Visit (HOSPITAL_COMMUNITY): Payer: Self-pay | Admitting: Internal Medicine

## 2019-09-06 DIAGNOSIS — R921 Mammographic calcification found on diagnostic imaging of breast: Secondary | ICD-10-CM

## 2019-09-08 DIAGNOSIS — E119 Type 2 diabetes mellitus without complications: Secondary | ICD-10-CM | POA: Diagnosis not present

## 2019-09-12 DIAGNOSIS — I10 Essential (primary) hypertension: Secondary | ICD-10-CM | POA: Diagnosis not present

## 2019-09-12 DIAGNOSIS — E7800 Pure hypercholesterolemia, unspecified: Secondary | ICD-10-CM | POA: Diagnosis not present

## 2019-09-12 DIAGNOSIS — E119 Type 2 diabetes mellitus without complications: Secondary | ICD-10-CM | POA: Diagnosis not present

## 2019-09-29 DIAGNOSIS — Z299 Encounter for prophylactic measures, unspecified: Secondary | ICD-10-CM | POA: Diagnosis not present

## 2019-09-29 DIAGNOSIS — I10 Essential (primary) hypertension: Secondary | ICD-10-CM | POA: Diagnosis not present

## 2019-09-29 DIAGNOSIS — E1165 Type 2 diabetes mellitus with hyperglycemia: Secondary | ICD-10-CM | POA: Diagnosis not present

## 2019-09-29 DIAGNOSIS — G35 Multiple sclerosis: Secondary | ICD-10-CM | POA: Diagnosis not present

## 2019-10-04 ENCOUNTER — Ambulatory Visit: Payer: Medicare Other | Admitting: Urology

## 2019-10-13 DIAGNOSIS — Z794 Long term (current) use of insulin: Secondary | ICD-10-CM | POA: Diagnosis not present

## 2019-10-13 DIAGNOSIS — E1122 Type 2 diabetes mellitus with diabetic chronic kidney disease: Secondary | ICD-10-CM | POA: Diagnosis not present

## 2019-10-13 DIAGNOSIS — N182 Chronic kidney disease, stage 2 (mild): Secondary | ICD-10-CM | POA: Diagnosis not present

## 2019-10-13 DIAGNOSIS — E039 Hypothyroidism, unspecified: Secondary | ICD-10-CM | POA: Diagnosis not present

## 2019-10-14 ENCOUNTER — Other Ambulatory Visit: Payer: Self-pay | Admitting: Urology

## 2019-10-14 LAB — COMPLETE METABOLIC PANEL WITH GFR
AG Ratio: 1.4 (calc) (ref 1.0–2.5)
ALT: 12 U/L (ref 6–29)
AST: 12 U/L (ref 10–35)
Albumin: 4 g/dL (ref 3.6–5.1)
Alkaline phosphatase (APISO): 90 U/L (ref 37–153)
BUN/Creatinine Ratio: 36 (calc) — ABNORMAL HIGH (ref 6–22)
BUN: 37 mg/dL — ABNORMAL HIGH (ref 7–25)
CO2: 27 mmol/L (ref 20–32)
Calcium: 9.4 mg/dL (ref 8.6–10.4)
Chloride: 102 mmol/L (ref 98–110)
Creat: 1.03 mg/dL — ABNORMAL HIGH (ref 0.50–0.99)
GFR, Est African American: 68 mL/min/{1.73_m2} (ref >=60)
GFR, Est Non African American: 59 mL/min/{1.73_m2} — ABNORMAL LOW (ref >=60)
Globulin: 2.9 g/dL (calc) (ref 1.9–3.7)
Glucose, Bld: 206 mg/dL — ABNORMAL HIGH (ref 65–139)
Potassium: 4.6 mmol/L (ref 3.5–5.3)
Sodium: 140 mmol/L (ref 135–146)
Total Bilirubin: 0.4 mg/dL (ref 0.2–1.2)
Total Protein: 6.9 g/dL (ref 6.1–8.1)

## 2019-10-14 LAB — T4, FREE: Free T4: 1.1 ng/dL (ref 0.8–1.8)

## 2019-10-14 LAB — TSH: TSH: 2.26 mIU/L (ref 0.40–4.50)

## 2019-10-19 ENCOUNTER — Other Ambulatory Visit: Payer: Self-pay

## 2019-10-19 ENCOUNTER — Ambulatory Visit (INDEPENDENT_AMBULATORY_CARE_PROVIDER_SITE_OTHER): Payer: Medicare Other | Admitting: Urology

## 2019-10-19 ENCOUNTER — Encounter: Payer: Self-pay | Admitting: Urology

## 2019-10-19 VITALS — BP 112/76 | HR 93

## 2019-10-19 DIAGNOSIS — R339 Retention of urine, unspecified: Secondary | ICD-10-CM | POA: Diagnosis not present

## 2019-10-19 DIAGNOSIS — N3020 Other chronic cystitis without hematuria: Secondary | ICD-10-CM | POA: Insufficient documentation

## 2019-10-19 LAB — URINALYSIS, ROUTINE W REFLEX MICROSCOPIC
Bilirubin, UA: NEGATIVE
Glucose, UA: NEGATIVE
Ketones, UA: NEGATIVE
Nitrite, UA: NEGATIVE
Specific Gravity, UA: 1.015 (ref 1.005–1.030)
Urobilinogen, Ur: 0.2 mg/dL (ref 0.2–1.0)
pH, UA: 5 (ref 5.0–7.5)

## 2019-10-19 LAB — MICROSCOPIC EXAMINATION: WBC, UA: >30 /hpf — AB (ref 0–5)

## 2019-10-19 LAB — BLADDER SCAN AMB NON-IMAGING: Scan Result: 103

## 2019-10-19 MED ORDER — TAMSULOSIN HCL 0.4 MG PO CAPS: 0.4000 mg | ORAL_CAPSULE | Freq: Every day | ORAL | 3 refills | Status: AC

## 2019-10-19 MED ORDER — NITROFURANTOIN MONOHYD MACRO 100 MG PO CAPS: 100.0000 mg | ORAL_CAPSULE | Freq: Two times a day (BID) | ORAL | 0 refills | Status: DC

## 2019-10-19 MED ORDER — METHENAMINE HIPPURATE 1 G PO TABS: 1.0000 g | ORAL_TABLET | Freq: Two times a day (BID) | ORAL | 3 refills | Status: AC

## 2019-10-19 NOTE — Progress Notes (Signed)
Urological Symptom Review  Patient is experiencing the following symptoms: Frequent urination Get up at night to urinate Leakage of urine   Review of Systems  Gastrointestinal (upper)  : Negative for upper GI symptoms  Gastrointestinal (lower) : Negative for lower GI symptoms  Constitutional : Negative for symptoms  Skin: Negative for skin symptoms  Eyes: Negative for eye symptoms  Ear/Nose/Throat : Negative for Ear/Nose/Throat symptoms  Hematologic/Lymphatic: Negative for Hematologic/Lymphatic symptoms  Cardiovascular : Negative for cardiovascular symptoms  Respiratory : Negative for respiratory symptoms  Endocrine: Negative for endocrine symptoms  Musculoskeletal: Negative for musculoskeletal symptoms  Neurological: Negative for neurological symptoms  Psychologic: Negative for psychiatric symptoms  

## 2019-10-19 NOTE — Patient Instructions (Signed)

## 2019-10-19 NOTE — Progress Notes (Signed)
10/19/2019 11:21 AM   [name] October 17, 1955 [id number]  Referring provider: Glenda Chroman, MD [address] [place],  [place] [address]  followup recurrent UTI  HPI: [name] is a 64yo here for followup for recurrent UTIs and urinary retention. PVR 103. No hospitalized UTIs since last visit.  Stream is strong. No worsening urgency or frequency. No dysuria. She has new nocturnal enuresis for the past 3 days.  Her records from [facility] are as follows: I have chronic cystitis.  HPI: [name] is a 64 year-old female established patient who is here for chronic cystitis.  She does have a burning sensation when she urinates. She does not have to strain or bear down to start her urinary stream. She is not having problems getting her urine stream started. She is not currently having trouble urinating. She is not having problems with emptying her bladder well.   She is not having problems with urinary control or incontinence. She is not urinating more frequently now than usual.   She does not have pelvic or rectal pain related to voiding.   08/28/2015: 3 mo f/u. Hx of relapsing-remitting multiple sclerosis for 15 years, with multidrug resistant urinary tract infections, and neurogenic bladder. She has had asymptomatic Klebsiella urinary tract infection, in 03/2015.Marland Kitchen She takes Ellura one q o day, and imipramine at night.  She has a week of dysuria and foul smelling   01/15/2016: No UTIs since last visit. She is on hiprex BID. She developed new nocturia 2x.   11/11/2016: She is unknown number of UTIs since last visit. She stopped her hiprex over 6 month ago.   01/28/2016: NO UTIs since last visit. She was treated with bactrim and then restarted hiprex.   08/25/2017: No UTIs since last visit. She is on hiprex BID   09/28/2018: She is on hiprex 1g BID. No UTIs since last vist     CC: I have urinary retention.  HPI: Her problem was diagnosed approximately 10/27/2014. Her Hanson symptoms did not begin  after she had a surgical procedure. Her urinary retention is being treated with flomax. Patient denies foley catheter, suprapubic tube, intemittent catheterization, hytrin, cardura, uroxatrol, rapaflo, avodart, and proscar.   She does have an abnormal sensation when needing to urinate. She does not have to strain or bear down to start her urinary stream. She does have a good size and strength to her urinary stream. She is having problems with emptying her bladder well. Her urine has not shut off completely.   She is having problems with urinary control or incontinence.   She has not previously had an indwelling catheter in for more than two weeks at a time.   01/27/2017: She is currently on flomax. She thinks she is emptying her bladder well.   08/25/2017: she is on flomax daily and denies incomplete emptying.   09/28/2018: No worsening LUTS. She is on flomax 0.1m daily       PMH: Past Medical History:  Diagnosis Date  . Diabetes mellitus without complication ([facility]   . High cholesterol   . Hypertension   . Hypothyroidism   . Morbid obesity ([facility]   . Margaret (multiple sclerosis) (HCC) relapsing remitting  . Severe obesity (BMI >= 40) ([facility] 10/11/2012    Surgical History: Past Surgical History:  Procedure Laterality Date  . BIOPSY  03/19/2015   Procedure: BIOPSY;  Surgeon: SDanie Binder MD;  [facility]: [facility];  Service: Endoscopy;;  random colon biopsy  . CHOLECYSTECTOMY  8/09  . COLONOSCOPY     about 7 years  . COLONOSCOPY WITH PROPOFOL N/A 03/19/2015   OVA:NVBTYO ileum and colon/small internal hemorrhoids  . LEFT OOPHORECTOMY Left 8/11    Home Medications:  Allergies as of 10/19/2019      Reactions   Penicillins Anaphylaxis   Has patient had a PCN reaction causing immediate rash, facial/tongue/throat swelling, SOB or lightheadedness with hypotension: Yes Has patient had a PCN reaction causing severe rash involving mucus membranes or skin necrosis: No Has patient had a PCN  reaction that required hospitalization No Has patient had a PCN reaction occurring within the last 10 years: No If all of the above answers are "NO", then may proceed with Cephalosporin use.   Lotensin [benazepril Hcl] Cough      Medication List       Accurate as of October 19, 2019 11:21 AM. If you have any questions, ask your nurse or doctor.        Accu-Chek Guide test strip Generic drug: glucose blood Use as instructed to test blood glucose twice daily   Accu-Chek Guide w/Device Kit 1 Piece by Does not apply route as directed. Test twice daily   aspirin 81 MG tablet Take 81 mg by mouth daily.   atorvastatin 40 MG tablet Commonly known as: LIPITOR Take 40 mg by mouth daily.   baclofen 10 MG tablet Commonly known as: LIORESAL TAKE 1 TABLET (10 MG) IN THE MORNING AND 2 TABLETS (20 MG) IN THE EVENING   blood glucose meter kit and supplies Kit 1 each by Does not apply route 2 (two) times daily. Dispense based on patient and insurance preference. Use two times daily   CALCIUM 600 PO Take 1 tablet by mouth daily.   donepezil 5 MG tablet Commonly known as: ARICEPT Take 1 tablet (5 mg total) by mouth at bedtime.   Ergocalciferol 50 MCG (2000 UT) Caps Take 2,000 capsules by mouth daily.   imipramine 25 MG tablet Commonly known as: TOFRANIL TAKE 1 TABLET AT BEDTIME   Lantus SoloStar 100 UNIT/ML Solostar Pen Generic drug: insulin glargine INJECT 60 UNITS UNDER THE SKIN DAILY WITH BREAKFAST   levothyroxine 25 MCG tablet Commonly known as: SYNTHROID Take 75 mcg daily before breakfast by mouth.   metFORMIN 500 MG 24 hr tablet Commonly known as: GLUCOPHAGE-XR TAKE 1 TABLET TWICE A DAY   methenamine 1 g tablet Commonly known as: HIPREX TAKE 1 TABLET TWICE A DAY   ReliOn Pen Needles 32G X 4 MM Misc Generic drug: Insulin Pen Needle   B-D ULTRAFINE III SHORT PEN 31G X 8 MM Misc Generic drug: Insulin Pen Needle USE 1 PEN NEEDLE TWICE A DAY   tamsulosin 0.4 MG  Caps capsule Commonly known as: FLOMAX TAKE 1 CAPSULE DAILY   tolterodine 4 MG 24 hr capsule Commonly known as: DETROL LA TAKE 1 CAPSULE DAILY   Toprol XL 25 MG 24 hr tablet Generic drug: metoprolol succinate Take 25 mg by mouth daily.   Trulicity 0.60 OK/5.9XH Sopn Generic drug: Dulaglutide INJECT 0.5 ML (0.75 MG) UNDER THE SKIN ONCE WEEKLY       Allergies:  Allergies  Allergen Reactions  . Penicillins Anaphylaxis    Has patient had a PCN reaction causing immediate rash, facial/tongue/throat swelling, SOB or lightheadedness with hypotension: Yes Has patient had a PCN reaction causing severe rash involving mucus membranes or skin necrosis: No Has patient had a PCN reaction that required hospitalization No Has patient had a PCN reaction  occurring within the last 10 years: No If all of the above answers are "NO", then may proceed with Cephalosporin use.   Marland Kitchen Lotensin [Benazepril Hcl] Cough    Family History: Family History  Problem Relation Age of Onset  . Heart attack Mother   . Diabetes Mother   . Aneurysm Father   . Heart disease Father   . Cancer Maternal Grandmother        breast  . Colon cancer Neg Hx     Social History:  reports that she has never smoked. She has never used smokeless tobacco. She reports that she does not drink alcohol and does not use drugs.  ROS: All other review of systems were reviewed and are negative except what is noted above in HPI  Physical Exam: BP 112/76   Pulse 93   Constitutional:  Alert and oriented, No acute distress. HEENT: [place] AT, moist mucus membranes.  Trachea midline, no masses. Cardiovascular: No clubbing, cyanosis, or edema. Respiratory: Normal respiratory effort, no increased work of breathing. GI: Abdomen is soft, nontender, nondistended, no abdominal masses GU: No CVA tenderness.  Lymph: No cervical or inguinal lymphadenopathy. Skin: No rashes, bruises or suspicious lesions. Neurologic: Grossly intact, no focal  deficits, moving all 4 extremities. Psychiatric: Normal mood and affect.  Laboratory Data: Lab Results  Component Value Date   WBC 10.1 06/19/2019   HGB 13.7 06/19/2019   HCT 42.5 06/19/2019   MCV 85 06/19/2019   PLT 373 06/19/2019    Lab Results  Component Value Date   CREATININE 1.03 (H) 10/13/2019    No results found for: PSA  No results found for: TESTOSTERONE  Lab Results  Component Value Date   HGBA1C 7.2 (A) 07/21/2019    Urinalysis No results found for: COLORURINE, APPEARANCEUR, LABSPEC, PHURINE, GLUCOSEU, HGBUR, BILIRUBINUR, KETONESUR, PROTEINUR, UROBILINOGEN, NITRITE, LEUKOCYTESUR  No results found for: LABMICR, [place], RBCUA, LABEPIT, MUCUS, BACTERIA  Pertinent Imaging:  No results found for this or any previous visit.  No results found for this or any previous visit.  No results found for this or any previous visit.  No results found for this or any previous visit.  Results for orders placed during the hospital encounter of 04/04/15  US Renal  Narrative CLINICAL DATA:  Chronic cystitis, morbid obesity, diabetes, hypertension  EXAM: RENAL / URINARY TRACT ULTRASOUND COMPLETE  COMPARISON:  Ultrasound of the kidneys of 05/08/2014  FINDINGS: Right Kidney:  Length: 11.0 cm. No hydronephrosis is seen. The echogenicity of the renal parenchyma is within normal limits.  Left Kidney:  Length: 10.5 cm. The echogenicity of the renal parenchyma is within normal limits. No hydronephrosis is noted.  Bladder:  The urinary bladder is moderately urine distended. Prevoid urine volume measures 246 cubic cm. Postvoid volume measures 133 cubic cm. Bilateral ureteral jets are noted.  IMPRESSION: 1. No hydronephrosis. The echogenicity of the renal parenchyma is within normal limits. 2. Bilateral ureteral jets are noted.   Electronically Signed By: Ivar Drape M.D. On: 04/04/2015 15:32  No results found for this or any previous visit.  No results  found for this or any previous visit.  No results found for this or any previous visit.   Assessment & Plan:    1. Chronic cystitis without hematuria -continue hiprex 1g BID -urine for culture, will call with results. Rx for macrobid given to patient to start if symptoms worsen - Urinalysis, Routine w reflex microscopic  2. Urinary retention -flomax 0.37m daily - BLADDER SCAN AMB  NON-IMAGING   No follow-ups on file.  Nicolette Bang, MD  [facility] [place]

## 2019-10-20 ENCOUNTER — Encounter: Payer: Self-pay | Admitting: Nurse Practitioner

## 2019-10-20 ENCOUNTER — Ambulatory Visit (INDEPENDENT_AMBULATORY_CARE_PROVIDER_SITE_OTHER): Payer: Medicare Other | Admitting: Nurse Practitioner

## 2019-10-20 VITALS — BP 118/81 | HR 96 | Ht 63.5 in | Wt 227.0 lb

## 2019-10-20 DIAGNOSIS — E782 Mixed hyperlipidemia: Secondary | ICD-10-CM | POA: Diagnosis not present

## 2019-10-20 DIAGNOSIS — I10 Essential (primary) hypertension: Secondary | ICD-10-CM

## 2019-10-20 DIAGNOSIS — N182 Chronic kidney disease, stage 2 (mild): Secondary | ICD-10-CM

## 2019-10-20 DIAGNOSIS — Z794 Long term (current) use of insulin: Secondary | ICD-10-CM | POA: Diagnosis not present

## 2019-10-20 DIAGNOSIS — E1122 Type 2 diabetes mellitus with diabetic chronic kidney disease: Secondary | ICD-10-CM

## 2019-10-20 DIAGNOSIS — Z23 Encounter for immunization: Secondary | ICD-10-CM | POA: Diagnosis not present

## 2019-10-20 DIAGNOSIS — E039 Hypothyroidism, unspecified: Secondary | ICD-10-CM | POA: Diagnosis not present

## 2019-10-20 LAB — POCT GLYCOSYLATED HEMOGLOBIN (HGB A1C): Hemoglobin A1C: 7 % — AB (ref 4.0–5.6)

## 2019-10-20 MED ORDER — ATORVASTATIN CALCIUM 40 MG PO TABS: 40.0000 mg | ORAL_TABLET | Freq: Every day | ORAL | 3 refills | Status: AC

## 2019-10-20 MED ORDER — TRULICITY 0.75 MG/0.5ML ~~LOC~~ SOAJ: SUBCUTANEOUS | 1 refills | Status: AC

## 2019-10-20 MED ORDER — METFORMIN HCL ER 500 MG PO TB24: 500.0000 mg | ORAL_TABLET | Freq: Two times a day (BID) | ORAL | 1 refills | Status: AC

## 2019-10-20 MED ORDER — LANTUS SOLOSTAR 100 UNIT/ML ~~LOC~~ SOPN: PEN_INJECTOR | SUBCUTANEOUS | 3 refills | Status: AC

## 2019-10-20 MED ORDER — LEVOTHYROXINE SODIUM 75 MCG PO TABS: 75.0000 ug | ORAL_TABLET | Freq: Every day | ORAL | 3 refills | Status: DC

## 2019-10-20 NOTE — Progress Notes (Signed)
10/20/2019             Endocrinology follow-up note   Subjective:    Patient ID: [name], female    DOB: [birth year],  Glenda Chroman, MD   Past Medical History:  Diagnosis Date  . Diabetes mellitus without complication ([place])   . High cholesterol   . Hypertension   . Hypothyroidism   . Morbid obesity ([place])   . MS (multiple sclerosis) (HCC) relapsing remitting  . Severe obesity (BMI >= 40) ([place]) 10/11/2012   Past Surgical History:  Procedure Laterality Date  . BIOPSY  03/19/2015   Procedure: BIOPSY;  Surgeon: Danie Binder, MD;  Location: [facility];  Service: Endoscopy;;  random colon biopsy  . CHOLECYSTECTOMY  8/09  . COLONOSCOPY     about 7 years  . COLONOSCOPY WITH PROPOFOL N/A 03/19/2015   URK:YHCWCB ileum and colon/small internal hemorrhoids  . LEFT OOPHORECTOMY Left 8/11   Social History   Socioeconomic History  . Marital status: Single    Spouse name: Not on file  . Number of children: 0  . Years of education: college  . Highest education level: Not on file  Occupational History  . Occupation: disabled    Fish farm manager: NOT EMPLOYED  Tobacco Use  . Smoking status: Never Smoker  . Smokeless tobacco: Never Used  Substance and Sexual Activity  . Alcohol use: No    Alcohol/week: 0.0 standard drinks    Comment: quit in 1975  . Drug use: No  . Sexual activity: Never    Birth control/protection: Post-menopausal  Other Topics Concern  . Not on file  Social History Narrative   Patient is single and lives alone.   Patient is right-handed.   Patient has a college education.   Patient does not drink any caffeine.   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   Social Determinants of Health   Financial Resource Strain:   . Difficulty of Paying Living Expenses: Not on file  Food Insecurity:   . Worried About Charity fundraiser in the Last Year: Not on file  . Ran Out of Food in the Last Year: Not on file  Transportation Needs:   . Lack of  Transportation (Medical): Not on file  . Lack of Transportation (Non-Medical): Not on file  Physical Activity:   . Days of Exercise per Week: Not on file  . Minutes of Exercise per Session: Not on file  Stress:   . Feeling of Stress : Not on file  Social Connections:   . Frequency of Communication with Friends and Family: Not on file  . Frequency of Social Gatherings with Friends and Family: Not on file  . Attends Religious Services: Not on file  . Active Member of Clubs or Organizations: Not on file  . Attends Archivist Meetings: Not on file  . Marital Status: Not on file   Outpatient Encounter Medications as of 10/20/2019  Medication Sig  . aspirin 81 MG tablet Take 81 mg by mouth daily.  Marland Kitchen atorvastatin (LIPITOR) 40 MG tablet Take 1 tablet (40 mg total) by mouth daily.  . B-D ULTRAFINE III SHORT PEN 31G X 8 MM MISC USE 1 PEN NEEDLE TWICE A DAY  . baclofen (LIORESAL) 10 MG tablet TAKE 1 TABLET (10 MG) IN THE MORNING AND 2 TABLETS (20 MG) IN THE EVENING  . blood glucose meter kit and supplies KIT 1 each by Does not apply route 2 (  two) times daily. Dispense based on patient and insurance preference. Use two times daily  . Blood Glucose Monitoring Suppl (ACCU-CHEK GUIDE) w/Device KIT 1 Piece by Does not apply route as directed. Test twice daily  . Calcium Carbonate (CALCIUM 600 PO) Take 1 tablet by mouth daily.  Marland Kitchen donepezil (ARICEPT) 5 MG tablet Take 1 tablet (5 mg total) by mouth at bedtime.  . Dulaglutide (TRULICITY) 0.35 KK/9.3GH SOPN INJECT 0.5 ML (0.75 MG) UNDER THE SKIN ONCE WEEKLY  . Ergocalciferol 2000 units CAPS Take 2,000 capsules by mouth daily.   Marland Kitchen glucose blood (ACCU-CHEK GUIDE) test strip Use as instructed to test blood glucose twice daily  . imipramine (TOFRANIL) 25 MG tablet TAKE 1 TABLET AT BEDTIME  . insulin glargine (LANTUS SOLOSTAR) 100 UNIT/ML Solostar Pen Inject 50 units SQ daily with breakfast  . levothyroxine (SYNTHROID) 75 MCG tablet Take 1 tablet (75  mcg total) by mouth daily before breakfast.  . metFORMIN (GLUCOPHAGE-XR) 500 MG 24 hr tablet Take 1 tablet (500 mg total) by mouth 2 (two) times daily.  . methenamine (HIPREX) 1 g tablet Take 1 tablet (1 g total) by mouth 2 (two) times daily.  . nitrofurantoin, macrocrystal-monohydrate, (MACROBID) 100 MG capsule Take 1 capsule (100 mg total) by mouth every 12 (twelve) hours.  . RELION PEN NEEDLES 32G X 4 MM MISC   . tamsulosin (FLOMAX) 0.4 MG CAPS capsule Take 1 capsule (0.4 mg total) by mouth daily.  Marland Kitchen tolterodine (DETROL LA) 4 MG 24 hr capsule TAKE 1 CAPSULE DAILY  . TOPROL XL 25 MG 24 hr tablet Take 25 mg by mouth daily.   . [DISCONTINUED] atorvastatin (LIPITOR) 40 MG tablet Take 40 mg by mouth daily.   . [DISCONTINUED] Dulaglutide (TRULICITY) 8.29 HB/7.1IR SOPN INJECT 0.5 ML (0.75 MG) UNDER THE SKIN ONCE WEEKLY  . [DISCONTINUED] LANTUS SOLOSTAR 100 UNIT/ML Solostar Pen INJECT 60 UNITS UNDER THE SKIN DAILY WITH BREAKFAST  . [DISCONTINUED] levothyroxine (SYNTHROID, LEVOTHROID) 25 MCG tablet Take 75 mcg daily before breakfast by mouth.  . [DISCONTINUED] metFORMIN (GLUCOPHAGE-XR) 500 MG 24 hr tablet TAKE 1 TABLET TWICE A DAY   No facility-administered encounter medications on file as of 10/20/2019.   ALLERGIES: Allergies  Allergen Reactions  . Penicillins Anaphylaxis    Has patient had a PCN reaction causing immediate rash, facial/tongue/throat swelling, SOB or lightheadedness with hypotension: Yes Has patient had a PCN reaction causing severe rash involving mucus membranes or skin necrosis: No Has patient had a PCN reaction that required hospitalization No Has patient had a PCN reaction occurring within the last 10 years: No If all of the above answers are "NO", then may proceed with Cephalosporin use.   Marland Kitchen Lotensin [Benazepril Hcl] Cough   VACCINATION STATUS: Immunization History  Administered Date(s) Administered  . Influenza,inj,Quad PF,6+ Mos 10/09/2018    Diabetes She presents  for her follow-up diabetic visit. She has type 2 diabetes mellitus. Onset time: She was diagnosed at approximate age of 37 years. Her disease course has been stable. There are no hypoglycemic associated symptoms. Pertinent negatives for hypoglycemia include no confusion, headaches, nervousness/anxiousness, pallor, seizures or tremors. Associated symptoms include fatigue. Pertinent negatives for diabetes include no chest pain, no polydipsia, no polyphagia, no polyuria and no weight loss. There are no hypoglycemic complications. Symptoms are stable. Diabetic complications include nephropathy and peripheral neuropathy. Risk factors for coronary artery disease include diabetes mellitus, dyslipidemia, hypertension, sedentary lifestyle and post-menopausal. Hanson diabetic treatment includes insulin injections and oral agent (monotherapy). She is compliant  with treatment most of the time. Her weight is stable. She is following a generally unhealthy diet. She has had a previous visit with a dietitian. She never participates in exercise. Her home blood glucose trend is fluctuating minimally. Her breakfast blood glucose range is generally 70-90 mg/dl. Her bedtime blood glucose range is generally 140-180 mg/dl. (She presents today, accompanied by her sister, with logs showing tight fasting and near target postprandial glycemic profile.  Her POCT A1C today is 7%, improving from last visit of 7.2%.  She does have some frequent episodes of fasting hypoglycemia.  Her sister reports they have to frequently call her and remind her to eat her meals, as she tends to forget.  She is moving to [place] to live with her other sister in 2 weeks.) An ACE inhibitor/angiotensin II receptor blocker is not being taken. She does not see a podiatrist.Eye exam is Hanson.  Thyroid Problem Presents for follow-up visit. Symptoms include fatigue. Patient reports no anxiety, cold intolerance, constipation, depressed mood, diarrhea, heat  intolerance, palpitations, tremors, weight gain or weight loss. The symptoms have been improving. Past treatments include levothyroxine. Her past medical history is significant for diabetes and hyperlipidemia.  Hyperlipidemia This is a chronic problem. The Hanson episode started more than 1 year ago. The problem is controlled. Recent lipid tests were reviewed and are normal. Exacerbating diseases include diabetes, hypothyroidism and obesity. Factors aggravating her hyperlipidemia include beta blockers. Pertinent negatives include no chest pain, myalgias or shortness of breath. Hanson antihyperlipidemic treatment includes statins. The Hanson treatment provides moderate improvement of lipids. Compliance problems include adherence to exercise (due to mobility deficits).  Risk factors for coronary artery disease include diabetes mellitus, dyslipidemia, hypertension, obesity, a sedentary lifestyle and post-menopausal.  Hypertension This is a chronic problem. The Hanson episode started more than 1 year ago. The problem has been gradually improving since onset. The problem is controlled. Pertinent negatives include no chest pain, headaches, palpitations or shortness of breath. Risk factors for coronary artery disease include dyslipidemia, diabetes mellitus, obesity, sedentary lifestyle, family history and post-menopausal state. Past treatments include beta blockers. The Hanson treatment provides significant improvement. There are no compliance problems.  Identifiable causes of hypertension include a thyroid problem.   Review of systems: Limited as above.    Objective:    BP 118/81 (BP Location: Left Arm, Patient Position: Sitting)   Pulse 96   Ht 5' 3.5" (1.613 m)   Wt 227 lb (103 kg)   BMI 39.58 kg/m   Wt Readings from Last 3 Encounters:  10/20/19 227 lb (103 kg)  07/21/19 230 lb 6.4 oz (104.5 kg)  06/19/19 228 lb (103.4 kg)    BP Readings from Last 3 Encounters:  10/20/19 118/81  10/19/19  112/76  07/21/19 125/78    Physical Exam- Limited  Constitutional:  Body mass index is 39.58 kg/m. , not in acute distress, normal state of mind Eyes:  EOMI, no exophthalmos Neck: Supple Thyroid: No gross goiter Cardiovascular: RRR, no murmers, rubs, or gallops, no edema Respiratory: Adequate breathing efforts, no crackles, rales, rhonchi, or wheezing Musculoskeletal: no gross deformities, strength intact in all four extremities, + Walks with a walker related to her MS/disequilibrium. Skin:  no rashes, no hyperemia Neurological: no tremor with outstretched hands   Results for orders placed or performed in visit on 10/20/19  HgB A1c  Result Value Ref Range   Hemoglobin A1C 7.0 (A) 4.0 - 5.6 %   HbA1c POC (<> result, manual entry)  HbA1c, POC (prediabetic range)     HbA1c, POC (controlled diabetic range)     Complete Blood Count (Most recent): Lab Results  Component Value Date   WBC 10.1 06/19/2019   HGB 13.7 06/19/2019   HCT 42.5 06/19/2019   MCV 85 06/19/2019   PLT 373 06/19/2019   Chemistry (most recent): Lab Results  Component Value Date   NA 140 10/13/2019   K 4.6 10/13/2019   CL 102 10/13/2019   CO2 27 10/13/2019   BUN 37 (H) 10/13/2019   CREATININE 1.03 (H) 10/13/2019   Diabetic Labs (most recent): Lab Results  Component Value Date   HGBA1C 7.0 (A) 10/20/2019   HGBA1C 7.2 (A) 07/21/2019   HGBA1C 6.8 (H) 01/16/2019   Lipid Panel     Component Value Date/Time   CHOL 171 09/20/2017 0000   TRIG 115 09/20/2017 0000   HDL 57 09/20/2017 0000   CHOLHDL 2.7 03/17/2016 1228   VLDL 23 03/17/2016 1228   LDLCALC 91 09/20/2017 0000    Assessment & Plan:   1. Type 2 diabetes mellitus with stage 2 chronic kidney disease, with long-term Hanson use of insulin (HCC)  -Her CKD has reversed,  patient remains at a high risk for more acute and chronic complications of diabetes which include CAD, CVA, CKD, retinopathy, and neuropathy. These are all discussed in  detail with the patient.  - Lately, she is getting a big help from her sisters, Jeannene Patella and Maudie Mercury.  -She presents today, accompanied by her sister, with logs showing tight fasting and near target postprandial glycemic profile.  Her POCT A1C today is 7%, improving from last visit of 7.2%.  She does have some frequent episodes of fasting hypoglycemia.  Her sister reports they have to frequently call her and remind her to eat her meals, as she tends to forget.  She is moving to [place] to live with her other sister in 2 weeks.  - The patient admits there is a room for improvement in their diet and drink choices. -  Suggestion is made for the patient to avoid simple carbohydrates from their diet including Cakes, Sweet Desserts / Pastries, Ice Cream, Soda (diet and regular), Sweet Tea, Candies, Chips, Cookies, Sweet Pastries,  Store Bought Juices, Alcohol in Excess of  1-2 drinks a day, Artificial Sweeteners, Coffee Creamer, and "Sugar-free" Products. This will help patient to have stable blood glucose profile and potentially avoid unintended weight gain.   - I encouraged the patient to switch to  unprocessed or minimally processed complex starch and increased protein intake (animal or plant source), fruits, and vegetables.   - Patient is advised to stick to a routine mealtimes to eat 3 meals  a day and avoid unnecessary snacks ( to snack only to correct hypoglycemia).  - I have approached patient with the following individualized plan to manage diabetes and patient agrees.  -Based on her tightening fasting glycemic profile, she will tolerate a lower dose of Lantus to 50 units SQ daily with breakfast.  She is to continue Hanson dose of Trulicity 4.31 mg SQ weekly, and Metformin 500 mg po twice daily with meals.  -She is encouraged to continue monitoring blood glucose at least twice per day, before breakfast and before bed and call clinic if readings are less than 70 or greater than 200 for 3 tests in a  row.  I have ordered her next set of labs and refilled her medications as a courtesy just in case she is not able  to establish care with PCP or endocrinologist in a reasonable amount of time.  - Patient specific target  for A1c; LDL, HDL, Triglycerides, and  Waist Circumference were discussed in detail.  2) BP/HTN:  Her blood pressure is controlled to target.  She is advised to continue Metoprolol 25 mg po daily.  3) Lipids/HPL:  Her most recent lipid panel from 09/20/17 shows controlled LDL at 91.  She is advised to continue Atorvastatin 40 mg po daily at bedtime.  Side effects and precautions discussed with her.  Will recheck lipid panel prior to next visit.  4)  Weight/Diet: CDE consult in progress, exercise, and carbohydrates information provided.  5)  Primary hypothyroidism -Her previsit TFTs are consistent with appropriate hormone replacement.  She is advised to continue Levothyroxine 75 mcg po daily before breakfast.   - We discussed about the correct intake of her thyroid hormone, on empty stomach at fasting, with water, separated by at least 30 minutes from breakfast and other medications,  and separated by more than 4 hours from calcium, iron, multivitamins, acid reflux medications (PPIs). -Patient is made aware of the fact that thyroid hormone replacement is needed for life, dose to be adjusted by periodic monitoring of thyroid function tests.  6) Chronic Care/Health Maintenance: -Patient is on Statin medications and encouraged to continue to follow up with Ophthalmology, Podiatrist at least yearly or according to recommendations, and advised to  stay away from smoking. I have recommended yearly flu vaccine and pneumonia vaccination at least every 5 years; and  sleep for at least 7 hours a day.  -Patient is moving to [place] to live with her other sister in 2 weeks.  She is advised to establish with primary care doctor and endocrinologist in her new city as soon as she is able.  I  advised patient to maintain close follow up with her PCP for primary care needs.   - Time spent on this patient care encounter:  35 min, of which > 50% was spent in  counseling and the rest reviewing her blood glucose logs , discussing her hypoglycemia and hyperglycemia episodes, reviewing her Hanson and  previous labs / studies  ( including abstraction from other facilities) and medications  doses and developing a  long term treatment plan and documenting her care.   Please refer to Patient Instructions for Blood Glucose Monitoring and Insulin/Medications Dosing Guide"  in media tab for additional information. Please  also refer to " Patient Self Inventory" in the Media  tab for reviewed elements of pertinent patient history.  [name] participated in the discussions, expressed understanding, and voiced agreement with the above plans.  All questions were answered to her satisfaction. she is encouraged to contact clinic should she have any questions or concerns prior to her return visit.  Follow up plan: Return if unable to get in with endocrinologist/PCP in reasonable amount of time.  Rayetta Pigg, [facility] [address] [place], [place] [address] Phone: [phone] Fax: [phone]   10/20/2019, 9:14 AM

## 2019-10-20 NOTE — Patient Instructions (Signed)
Advice for Weight Management  -For most of us the best way to lose weight is by diet management. Generally speaking, diet management means consuming less calories intentionally which over time brings about progressive weight loss.  This can be achieved more effectively by restricting carbohydrate consumption to the minimum possible.  So, it is critically important to know your numbers: how much calorie you are consuming and how much calorie you need. More importantly, our carbohydrates sources should be unprocessed or minimally processed complex starch food items.   Sometimes, it is important to balance nutrition by increasing protein intake (animal or plant source), fruits, and vegetables.  -Sticking to a routine mealtime to eat 3 meals a day and avoiding unnecessary snacks is shown to have a big role in weight control. Under normal circumstances, the only time we lose real weight is when we are hungry, so allow hunger to take place- hunger means no food between meal times, only water.  It is not advisable to starve.   -It is better to avoid simple carbohydrates including: Cakes, Sweet Desserts, Ice Cream, Soda (diet and regular), Sweet Tea, Candies, Chips, Cookies, Store Bought Juices, Alcohol in Excess of  1-2 drinks a day, Artificial Sweeteners, Doughnuts, Coffee Creamers, "Sugar-free" Products, etc, etc.  This is not a complete list.....    -Consulting with certified diabetes educators is proven to provide you with the most accurate and current information on diet.  Also, you may be  interested in discussing diet options/exchanges , we can schedule a visit with [name], RDN, CDE for individualized nutrition education.  -Exercise: If you are able: 30 -60 minutes a day ,4 days a week, or 150 minutes a week.  The longer the better.  Combine stretch, strength, and aerobic activities.  If you were told in the past that you have high risk for cardiovascular diseases, you may seek evaluation by  your heart doctor prior to initiating moderate to intense exercise programs.    

## 2019-10-24 ENCOUNTER — Ambulatory Visit (HOSPITAL_COMMUNITY)
Admission: RE | Admit: 2019-10-24 | Discharge: 2019-10-24 | Disposition: A | Discharge status: Home / Self Care | Payer: Medicare Other | Source: Ambulatory Visit | Attending: Internal Medicine | Admitting: Internal Medicine

## 2019-10-24 ENCOUNTER — Ambulatory Visit: Payer: Medicare Other | Admitting: Family Medicine

## 2019-10-24 ENCOUNTER — Other Ambulatory Visit: Payer: Self-pay

## 2019-10-24 DIAGNOSIS — R921 Mammographic calcification found on diagnostic imaging of breast: Secondary | ICD-10-CM

## 2019-10-25 LAB — URINE CULTURE

## 2019-10-26 ENCOUNTER — Ambulatory Visit: Payer: Medicare Other | Admitting: Urology

## 2019-10-26 DIAGNOSIS — E119 Type 2 diabetes mellitus without complications: Secondary | ICD-10-CM | POA: Diagnosis not present

## 2019-10-26 DIAGNOSIS — E7800 Pure hypercholesterolemia, unspecified: Secondary | ICD-10-CM | POA: Diagnosis not present

## 2019-10-26 DIAGNOSIS — I10 Essential (primary) hypertension: Secondary | ICD-10-CM | POA: Diagnosis not present

## 2019-10-27 ENCOUNTER — Telehealth: Payer: Self-pay

## 2019-10-27 ENCOUNTER — Other Ambulatory Visit: Payer: Self-pay

## 2019-10-27 DIAGNOSIS — N390 Urinary tract infection, site not specified: Secondary | ICD-10-CM

## 2019-10-27 MED ORDER — SULFAMETHOXAZOLE-TRIMETHOPRIM 800-160 MG PO TABS: 1.0000 | ORAL_TABLET | Freq: Two times a day (BID) | ORAL | 0 refills | Status: AC

## 2019-10-27 NOTE — Telephone Encounter (Signed)
Rx sent in per Dr. Ronne Binning and pt sister notified.

## 2019-10-28 ENCOUNTER — Other Ambulatory Visit: Payer: Self-pay | Admitting: Neurology

## 2019-10-28 DIAGNOSIS — G35 Multiple sclerosis: Secondary | ICD-10-CM

## 2019-10-30 ENCOUNTER — Encounter: Payer: Self-pay | Admitting: Family Medicine

## 2019-10-30 ENCOUNTER — Other Ambulatory Visit: Payer: Self-pay

## 2019-10-30 ENCOUNTER — Other Ambulatory Visit: Payer: Self-pay | Admitting: Nurse Practitioner

## 2019-10-30 ENCOUNTER — Telehealth: Payer: Self-pay | Admitting: Nurse Practitioner

## 2019-10-30 ENCOUNTER — Ambulatory Visit (INDEPENDENT_AMBULATORY_CARE_PROVIDER_SITE_OTHER): Payer: Medicare Other | Admitting: Family Medicine

## 2019-10-30 VITALS — BP 117/67 | HR 85 | Ht 64.0 in | Wt 228.0 lb

## 2019-10-30 DIAGNOSIS — G35 Multiple sclerosis: Secondary | ICD-10-CM | POA: Diagnosis not present

## 2019-10-30 DIAGNOSIS — R350 Frequency of micturition: Secondary | ICD-10-CM

## 2019-10-30 DIAGNOSIS — G3184 Mild cognitive impairment, so stated: Secondary | ICD-10-CM | POA: Diagnosis not present

## 2019-10-30 DIAGNOSIS — R269 Unspecified abnormalities of gait and mobility: Secondary | ICD-10-CM | POA: Diagnosis not present

## 2019-10-30 DIAGNOSIS — R29898 Other symptoms and signs involving the musculoskeletal system: Secondary | ICD-10-CM

## 2019-10-30 MED ORDER — TOLTERODINE TARTRATE ER 4 MG PO CP24: 4.0000 mg | ORAL_CAPSULE | Freq: Every day | ORAL | 3 refills | Status: AC

## 2019-10-30 MED ORDER — DONEPEZIL HCL 5 MG PO TABS: 5.0000 mg | ORAL_TABLET | Freq: Every day | ORAL | 3 refills | Status: AC

## 2019-10-30 MED ORDER — IMIPRAMINE HCL 25 MG PO TABS: 25.0000 mg | ORAL_TABLET | Freq: Every day | ORAL | 3 refills | Status: AC

## 2019-10-30 NOTE — Progress Notes (Signed)
PATIENT: Margaret [name] DOB: 06/06/1955  REASON FOR VISIT: follow up HISTORY FROM: patient  Chief Complaint  Patient presents with  . Follow-up    ms fu, rm 16, with sister      HISTORY OF PRESENT ILLNESS: Today 10/30/19 Margaret [name] is a 64 y.o. female here today for follow up for SPMS. [place] moving [place] in 2 days. She has appt with new PCP on 11/06/2019. Margaret [name] [facility] in [place], [place]. Her last Ocrevus infusion awas in August, 2021. MR thoracic and cervical spine in 06/2019 showed MS stable.   She reports that she is doing well, today. She denies new or worsening symptoms. Gait is stable with Rolator. She continues to have waxing and waning lower extremity weakness (L>R). She feels baclofen helps with spasticity. She has had 1 fall since last being seen. She lost her balance in her bedroom. Fortunately no injuries.    She is sleeping well. She feels mood is stable. She continues imipramine for headaches that is working well. Memory and cognition seem stable. She continues Aricept 28m daily. Bowel and bladder habits are unchanged. Frequency better on Detrol LA.    HISTORY: (copied from Margaret [name]'s note on 06/19/2019)   06-19-2019. I have the pleasure of meeting today with Margaret [name] and meanwhile 64year old Caucasian female MS patient.  She is today accompanied by her sister, Margaret [name]  [place]  The patient has been followed for many years, she originally was diagnosed with relapsing remitting MS which later converted to a  secondary progressive form. She had in 2017 started on Ocrevus.  At the time she had a brain MRI and cervical spine MRI without contrast but it confirmed that as to her diagnosis she has now reached already progressive MS.  Ocrevus was FDA approved for the treatment of progressive MS and for this reason we changed to the drug at the time.  There was no other medication FDA approved for the treatment of this form.   Her next infusion will be on August 2.  [place] of this [place].   We will have to make arrangements for her that at a local infusion center and neurologist will follow her. [place]  Clinically our visit today was directed to words her more recent complaints of cognitive difficulties, and I changed her diagnosis to mild cognitive impairment with memory loss in November 2018.  Today she was evaluated by a Montreal cognitive assessment test and scored 28 out of 30 points, this is not dementia.  I have to say that in the years 2017 2018 there were a lot of memory lapses, cognitive deficits and some led to her not appearing for appointments not having blood drawn and treatments being delayed.  This seems no longer to be an issue and I wonder if it was also either a state of depression where a medication side effect or medical condition side effect.  She is conversant fluent and [name] a Montreal cognitive assessment of 28 out of 30 point is an excellent result.  Interval history from 23 December 2016,  I am seeing Margaret. Margaret Pankoninhere today long-established patient with MS and recent cognitive decline, in the presence of her sister PMonia Hanson [place]  In our last visit 6 weeks ago we had discussed to start endocrine fluids for the  treatment of MS which helps her to be compliant. The first infusion is scheduled for December 27 th.  In preparation we had obtained a blood test for tuberculosis was returned negative,  negative hepatitis panel, comprehensive metabolic panel and CBC.  Her creatinine is a slight bit elevated, but her glomerular filtration rate was 59 -which is still considered normal.   She also had high blood sugars.  White and red blood cell counts were normal region.There was no anemia.  The patient's MRI from 23 November 2016 showed multiple T2 and FLAIR hyperintense foci in the pattern and  configuration consistent with demyelinating plaques.  This is a chronic multiple sclerosis as none of the foci appear to be acute.  I consider the patient a good candidate for OCREVUS. I was pleasantly surprised to see the patient looking brighter, more alert and certainly overall healthy.today She has eaten more regularly and more fresh food, she also has more social interaction leaves the house more often.  Appears more alert.  She is able to walk with her  4 wheeled walker. Margaret [name] felt that Aricept has had a positive effect on her sister.  We will repeat a MOCA/ MMSE in 3 month , next visit with NP.   HPI: 11-09-2016 Margaret [name] is a 64 y.o. female  Is seen here as a revisit  from Margaret [name] for multiple sclerosis follow up.  I had the pleasure of seeing Margaret [name] today in the presence of her sister, physician assistant Margaret [name]. I know Margaret [name] from the [facility] radiology department where she has worked with the interventional team. I last saw Margaret [name] in late 2017 when our goal was to change her to a medication of infusion character, namely OCREVUS. At the time she had an MRI with out contrast that confirmed that and as was her diagnosis, but could not 100% differentiate between acute and more remote lesions. Now, almost a year later when it [name] as Margaret. Boulier had missed some of her appointments. From now on she will bring her sister with her. My goal is still to change the patient to a therapy that doesn't need to daily or every other day compliance neither pill no injection but rather an infusion therapy here in office. Low compliance would therefore not be as impairing. I would still like to change her to OCREVUS- but will have to repeat the hepatitis panel, the gold tuberculosis test and the patient information form.  She is not driving any longer but lives alone- close to her sister. CD  The patient had followed Margaret. Jacolyn Hanson for over 20 years , diagnosed since 1989 ,, and is now mainly  seen by Margaret Rubin, NP. She has never been on another medication, but Betaseron.  In my last visit , 2013 , I suggested Ampyra to her , which we than initiated but had to D/C after her kidney function changed.   The patient is diabetic, diagnosed in 2008. She just changed in 2014 to insulin.  She remains on Betaseron. Has been gaining weight and has had a fall last December. Uses a Rollator when she is outdoors but indoors a walking stick or cane, but more and more frequently the walker. .   She believes her sleep is poor due to spasms and nocturia, she may snore. Still wakes with a dry mouth, has been told she snores. Sleeps prone. The patient underwent a sleep study I believe in 2014, but was unable to tolerate the  CPAP due to her sleeping habit. She has a chief complaint today of cramping pain in the left leg, spasm, at the groin and right above the hip. No radiation , no burning and no deep ache - just spams. Onset 3 weeks ago.  Although, she has taken baclofen po now tid  for a long time , for the " MS hug" - it had an effect only on abdominal spasms. On the same dose, it has not affected the hip spasms.  She believes her sleep is poor due to spasms and nocturia, she may snore. Wakes with a dry mouth, has been told she snores. Sleeps prone.  The spasms in the left hip are preceded by sitting in certain low chairs in [employer] , her volunteer work place.  Margaret [name]  recently did some routine Labs, that are not available on EPIC.   Interval history from 09/17/2015, Margaret [name] is seen here today after her May 2017 first visit with me. She had an excellent comprehensive metabolic panel result no abnormalities of liver or kidney function. She tends to have higher potassium levels but she was also not diluting properly, meaning the patient was probably dehydrated to some degree at the time of the blood draw. I also checked her for JC virus and it was -0.21 JC antibody is considered negative or  indeterminate. Theoretically, this patient could be using an oral medication for the treatment of MS and many of these are stronger than the injectable interferons. However her MRI from recent has shown no acute lesions and her history doesn't really speak of recent relapses. 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 I will add the necessary labs today which are also including a hepatitis panel and a gold blood test for tuberculosis. If these are negative I will really prefer her to have an infusion therapy. 09-17-2015  35 minute re-Assessment with ore than 50% of the time dedicated to face to face discussion and coordination of care. :   Originally MS relapsing - remitting , but now  without relapse over 10 years . May enter the  progressive phase of the disease.  She will remain on Betaseron, not oral medication. 20 years .  She was not doing well on AMPYRA, her renal function declined..  Fatigue due to MS or sleepiness from nocturia and apnea?  Sleep study revealed mild AHI, tried CPAP but couldn't tolerate the sleep position. She sleeps usually prone/  Baclofen now 10 mg tid. Spasm are most bother some at night. She has refills.   PT re - evaluation , fall prevention to be re- discussed.   She uses a walker  .   Her urologist at [place] - she sees Margaret. Gaynelle Arabian.  CMET was normal, JCV indeterminate, MRI without acute brain lesions.   I have the pleasure of seeing Margaret [name] today on 01/13/2016, who has not had any clinical symptoms of relapse since last seen in August 2017. She reports memory deficits however. Margaret. Felecia Shelling had read her MRI and advised me in regards to possible starting OCREVUS. Her viral panel, hepatitis panel were all in normal limits. She would be a candidate. The question is if her disease will progress further or if percreta was could stop it. She may have ended secondary  progressive MS at this point. She is currently on Betaseron , for about 20 years.  She will  continue BETASERON, until [place] will be approved. She is very interested.    REVIEW OF SYSTEMS: Out of a complete 14 system review of symptoms, the patient complains only of the following symptoms, spasticity, urinary frequency, weakness, gait abnormality, short term memory loss and all other reviewed systems are negative.   ALLERGIES: Allergies  Allergen Reactions  . Penicillins Anaphylaxis    Has patient had a PCN reaction causing immediate rash, facial/tongue/throat swelling, SOB or lightheadedness with hypotension: Yes Has patient had a PCN reaction causing severe rash involving mucus membranes or skin necrosis: No Has patient had a PCN reaction that required hospitalization No Has patient had a PCN reaction occurring within the last 10 years: No If all of the above answers are "NO", then may proceed with Cephalosporin use.   Marland Kitchen Lotensin [Benazepril Hcl] Cough    HOME MEDICATIONS: Outpatient Medications Prior to Visit  Medication Sig Dispense Refill  . aspirin 81 MG tablet Take 81 mg by mouth daily.    Marland Kitchen atorvastatin (LIPITOR) 40 MG tablet Take 1 tablet (40 mg total) by mouth daily. 90 tablet 3  . B-D ULTRAFINE III SHORT PEN 31G X 8 MM MISC USE 1 PEN NEEDLE TWICE A DAY 180 each 1  . baclofen (LIORESAL) 10 MG tablet TAKE 1 TABLET (10 MG) IN THE MORNING AND 2 TABLETS (20 MG) IN THE EVENING 270 tablet 3  . blood glucose meter kit and supplies KIT 1 each by Does not apply route 2 (two) times daily. Dispense based on patient and insurance preference. Use two times daily 1 each 0  . Blood Glucose Monitoring Suppl (ACCU-CHEK GUIDE) w/Device KIT 1 Piece by Does not apply route as directed. Test twice daily 1 kit 0  . Calcium Carbonate (CALCIUM 600 PO) Take 1 tablet by mouth daily.    . Dulaglutide (TRULICITY) 7.61 PJ/0.9TO SOPN INJECT 0.5 ML (0.75 MG) UNDER THE SKIN ONCE WEEKLY 6 mL 1  .  Ergocalciferol 2000 units CAPS Take 2,000 capsules by mouth daily.     Marland Kitchen glucose blood (ACCU-CHEK GUIDE) test strip Use as instructed to test blood glucose twice daily 150 each 2  . insulin glargine (LANTUS SOLOSTAR) 100 UNIT/ML Solostar Pen Inject 50 units SQ daily with breakfast (Patient taking differently: 50 Units. Inject 50 units SQ daily with breakfast) 30 mL 3  . levothyroxine (SYNTHROID) 25 MCG tablet Take 25 mcg by mouth daily before breakfast.    . metFORMIN (GLUCOPHAGE-XR) 500 MG 24 hr tablet Take 1 tablet (500 mg total) by mouth 2 (two) times daily. 180 tablet 1  . methenamine (HIPREX) 1 g tablet Take 1 tablet (1 g total) by mouth 2 (two) times daily. 180 tablet 3  . RELION PEN NEEDLES 32G X 4 MM MISC     . sulfamethoxazole-trimethoprim (BACTRIM DS) 800-160 MG tablet Take 1 tablet by mouth every 12 (twelve) hours. 14 tablet 0  . tamsulosin (FLOMAX) 0.4 MG CAPS capsule Take 1 capsule (0.4 mg total) by mouth daily. 90 capsule 3  . TOPROL XL 25 MG 24 hr tablet Take 25 mg by mouth daily.     Marland Kitchen donepezil (ARICEPT) 5 MG tablet Take 1 tablet (5 mg total) by mouth at bedtime. 90 tablet 3  . imipramine (TOFRANIL) 25 MG tablet TAKE 1 TABLET AT BEDTIME 90 tablet 1  . tolterodine (DETROL LA) 4 MG 24 hr capsule TAKE 1 CAPSULE DAILY 90 capsule 3  . nitrofurantoin, macrocrystal-monohydrate, (MACROBID) 100 MG capsule Take 1 capsule (100 mg total)  by mouth every 12 (twelve) hours. 14 capsule 0   No facility-administered medications prior to visit.    PAST MEDICAL HISTORY: Past Medical History:  Diagnosis Date  . Diabetes mellitus without complication ([facility])   . High cholesterol   . Hypertension   . Hypothyroidism   . Morbid obesity ([place])   . MS (multiple sclerosis) (HCC) relapsing remitting  . Severe obesity (BMI >= 40) ([place]) 10/11/2012    PAST SURGICAL HISTORY: Past Surgical History:  Procedure Laterality Date  . BIOPSY  03/19/2015   Procedure: BIOPSY;  Surgeon: Danie Binder, MD;   Location: [facility];  Service: Endoscopy;;  random colon biopsy  . CHOLECYSTECTOMY  8/09  . COLONOSCOPY     about 7 years  . COLONOSCOPY WITH PROPOFOL N/A 03/19/2015   GMW:NUUVOZ ileum and colon/small internal hemorrhoids  . LEFT OOPHORECTOMY Left 8/11    FAMILY HISTORY: Family History  Problem Relation Age of Onset  . Heart attack Mother   . Diabetes Mother   . Aneurysm Father   . Heart disease Father   . Cancer Maternal Grandmother        breast  . Colon cancer Neg Hx     SOCIAL HISTORY: Social History   Socioeconomic History  . Marital status: Single    Spouse name: Not on file  . Number of children: 0  . Years of education: college  . Highest education level: Not on file  Occupational History  . Occupation: disabled    Fish farm manager: NOT EMPLOYED  Tobacco Use  . Smoking status: Never Smoker  . Smokeless tobacco: Never Used  Substance and Sexual Activity  . Alcohol use: No    Alcohol/week: 0.0 standard drinks    Comment: quit in 1975  . Drug use: No  . Sexual activity: Never    Birth control/protection: Post-menopausal  Other Topics Concern  . Not on file  Social History Narrative   Patient is single and lives alone.   Patient is right-handed.   Patient has a college education.   Patient does not drink any caffeine.   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   Social Determinants of Health   Financial Resource Strain:   . Difficulty of Paying Living Expenses: Not on file  Food Insecurity:   . Worried About Charity fundraiser in the Last Year: Not on file  . Ran Out of Food in the Last Year: Not on file  Transportation Needs:   . Lack of Transportation (Medical): Not on file  . Lack of Transportation (Non-Medical): Not on file  Physical Activity:   . Days of Exercise per Week: Not on file  . Minutes of Exercise per Session: Not on file  Stress:   . Feeling of Stress : Not on file  Social Connections:   . Frequency of Communication with  Friends and Family: Not on file  . Frequency of Social Gatherings with Friends and Family: Not on file  . Attends Religious Services: Not on file  . Active Member of Clubs or Organizations: Not on file  . Attends Archivist Meetings: Not on file  . Marital Status: Not on file  Intimate Partner Violence:   . Fear of [name] or Ex-Partner: Not on file  . Emotionally Abused: Not on file  . Physically Abused: Not on file  . Sexually Abused: Not on file      PHYSICAL EXAM  Vitals:   10/30/19 1505  BP: 117/67  Pulse: 85  Weight: 228 lb (103.4 kg)  Height: '5\' 4"'  (1.626 m)   Body mass index is 39.14 kg/m.  Generalized: Well developed, in no acute distress  Cardiology: normal rate and rhythm, no murmur noted Respiratory: clear to auscultation bilaterally  Neurological examination  Mentation: Alert oriented to time, place, history taking. Follows all commands speech and language fluent Cranial nerve II-XII: Pupils were equal round reactive to light. Extraocular movements were full, visual field were full on confrontational test. Facial sensation and strength were normal. Uvula tongue midline. Head turning and shoulder shrug  were normal and symmetric. Motor: The motor testing reveals 5 over 5 strength of bilateral upper extremities, 4/5 right hip flexion and leg extension, 3+/5 left hip flexion, 4/5 leg extension.  Sensory: Sensory testing is intact to soft touch on all 4 extremities. No evidence of extinction is noted.  Coordination: Cerebellar testing reveals good finger-nose-finger with reduced heel-to-shin of left lower. No ataxia or tremor noted today.  Gait and station: Gait is mildly spastic, walks with Rolator, tandem not attempted    DIAGNOSTIC DATA (LABS, IMAGING, TESTING) - I reviewed patient records, labs, notes, testing and imaging myself where available.  MMSE - Mini Mental State Exam 01/17/2019 01/13/2018 06/17/2017  Orientation to time '5 4 4  ' Orientation to  Place '5 5 5  ' Registration '3 3 3  ' Attention/ Calculation '5 5 5  ' Recall '3 3 3  ' Language- name 2 objects '2 2 2  ' Language- repeat '1 1 1  ' Language- follow 3 step command '3 3 3  ' Language- read & follow direction '1 1 1  ' Write a sentence 1 0 0  Write a sentence-comments - no subject sentence didn't have subject  Copy design '1 1 1  ' Copy design-comments named 8 animals - -  Total score '30 28 28     ' Lab Results  Component Value Date   WBC 10.1 06/19/2019   HGB 13.7 06/19/2019   HCT 42.5 06/19/2019   MCV 85 06/19/2019   PLT 373 06/19/2019      Component Value Date/Time   NA 140 10/13/2019 1404   NA 143 06/19/2019 1205   K 4.6 10/13/2019 1404   CL 102 10/13/2019 1404   CO2 27 10/13/2019 1404   GLUCOSE 206 (H) 10/13/2019 1404   BUN 37 (H) 10/13/2019 1404   BUN 31 (H) 06/19/2019 1205   CREATININE 1.03 (H) 10/13/2019 1404   CALCIUM 9.4 10/13/2019 1404   PROT 6.9 10/13/2019 1404   PROT 6.9 06/19/2019 1205   ALBUMIN 4.1 06/19/2019 1205   AST 12 10/13/2019 1404   ALT 12 10/13/2019 1404   ALKPHOS 104 06/19/2019 1205   BILITOT 0.4 10/13/2019 1404   BILITOT 0.2 06/19/2019 1205   GFRNONAA 59 (L) 10/13/2019 1404   GFRAA 68 10/13/2019 1404   Lab Results  Component Value Date   CHOL 171 09/20/2017   HDL 57 09/20/2017   LDLCALC 91 09/20/2017   TRIG 115 09/20/2017   CHOLHDL 2.7 03/17/2016   Lab Results  Component Value Date   HGBA1C 7.0 (A) 10/20/2019   No results found for: VITAMINB12 Lab Results  Component Value Date   TSH 2.26 10/13/2019       ASSESSMENT AND PLAN 64 y.o. year old female  has a past medical history of Diabetes mellitus without complication ([place]), High cholesterol, Hypertension, Hypothyroidism, Morbid obesity ([place]), MS (multiple sclerosis) ([place]) (relapsing remitting), and Severe obesity (BMI >= 40) ([place]) (10/11/2012). here with  ICD-10-CM   1. Secondary progressive multiple sclerosis ([place])  G35   2. MS (multiple sclerosis) (HCC)  G35 donepezil (ARICEPT)  5 MG tablet  3. Transient weakness of right lower extremity  R29.898   4. Abnormality of gait  R26.9   5. Mild cognitive impairment  G31.84   6. Urinary frequency  R35.0     Frady is doing well, today. She is moving to be closer to her family in two days. We have discussed need to be careful with transition. She was encouraged to ensure adequate sleep, hydration and eat well balanced meals. She will continue [name] treatment plan. We have discussed resuming PT once established in [place]. She has appt with PCP on 11/06/2019 and will discuss. Refills have been sent. Fall precautions. She is aware that she can reach out anytime if she needs [place]. She and her sister, Jeannene Patella, verbalize understanding and agreement with this plan.    No orders of the defined types were placed in this encounter.    Meds ordered this encounter  Medications  . donepezil (ARICEPT) 5 MG tablet    Sig: Take 1 tablet (5 mg total) by mouth at bedtime.    Dispense:  90 tablet    Refill:  3    Order Specific Question:   Supervising Provider    Answer:   Melvenia Beam [id number]  . imipramine (TOFRANIL) 25 MG tablet    Sig: Take 1 tablet (25 mg total) by mouth at bedtime.    Dispense:  90 tablet    Refill:  3    Order Specific Question:   Supervising Provider    Answer:   Melvenia Beam [id number]  . tolterodine (DETROL LA) 4 MG 24 hr capsule    Sig: Take 1 capsule (4 mg total) by mouth daily.    Dispense:  90 capsule    Refill:  3    Order Specific Question:   Supervising Provider    Answer:   Melvenia Beam [id number]      I spent 35 minutes with the patient. 50% of this time was spent counseling and educating patient on plan of care and medications.    Debbora Presto, FNP-C 10/30/2019, 4:18 PM [facility] [address] [place], [place] [address] [phone]

## 2019-10-30 NOTE — Telephone Encounter (Signed)
Pt daughter Elita Quick called back and said that she believes it was not documented that she was on because Dr. Sherril Croon her PCP has been prescribing this and is the one that has her on for over a year.

## 2019-10-30 NOTE — Telephone Encounter (Signed)
She will continue to get Synthroid from Dr Sherril Croon, just wants our records to indicate .

## 2019-10-30 NOTE — Patient Instructions (Signed)
We will continue current treatment plan. I am so excited for you. Please let us know if you need anything during the transition to [place].   It has been an honor to participate in your care   Multiple Sclerosis Multiple sclerosis (MS) is a disease of the brain, spinal cord, and optic nerves (central nervous system). It causes the body's disease-fighting (immune) system to destroy the protective covering (myelin sheath) around nerves in the brain. When this happens, signals (nerve impulses) going to and from the brain and spinal cord do not get sent properly or may not get sent at all. There are several types of MS:  Relapsing-remitting MS. This is the most common type. This causes sudden attacks of symptoms. After an attack, you may recover completely until the next attack, or some symptoms may remain permanently.  Secondary progressive MS. This usually develops after the onset of relapsing-remitting MS. Similar to relapsing-remitting MS, this type also causes sudden attacks of symptoms. Attacks may be less frequent, but symptoms slowly get worse (progress) over time.  Primary progressive MS. This causes symptoms that steadily progress over time. This type of MS does not cause sudden attacks of symptoms. The age of onset of MS varies, but it often develops between 13-27 years of age. MS is a lifelong (chronic) condition. There is no cure, but treatment can help slow down the progression of the disease. What are the causes? The cause of this condition is not known. What increases the risk? You are more likely to develop this condition if:  You are a woman.  You have a relative with MS. However, the condition is not passed from parent to child (inherited).  You have a lack (deficiency) of vitamin D.  You smoke. MS is more common in the [place] than in the [place]. What are the signs or symptoms? Relapsing-remitting and secondary progressive MS cause  symptoms to occur in episodes or attacks that may last weeks to months. There may be long periods between attacks in which there are almost no symptoms. Primary progressive MS causes symptoms to steadily progress after they develop. Symptoms of MS vary because of the many different ways it affects the central nervous system. The main symptoms include:  Vision problems and eye pain.  Numbness.  Weakness.  Inability to move your arms, hands, feet, or legs (paralysis).  Balance problems.  Shaking that you cannot control (tremors).  Muscle spasms.  Problems with thinking (cognitive changes). MS can also cause symptoms that are associated with the disease, but are not always the direct result of an MS attack. They may include:  Inability to control urination or bowel movements (incontinence).  Headaches.  Fatigue.  Inability to tolerate heat.  Emotional changes.  Depression.  Pain. How is this diagnosed? This condition is diagnosed based on:  Your symptoms.  A neurological exam. This involves checking central nervous system function, such as nerve function, reflexes, and coordination.  MRIs of the brain and spinal cord.  Lab tests, including a lumbar puncture that tests the fluid that surrounds the brain and spinal cord (cerebrospinal fluid).  Tests to measure the electrical activity of the brain in response to stimulation (evoked potentials). How is this treated? There is no cure for MS, but medicines can help decrease the number and frequency of attacks and help relieve nuisance symptoms. Treatment options may include:  Medicines that reduce the frequency of attacks. These medicines may be given by injection, by mouth (orally), or  through an IV.  Medicines that reduce inflammation (steroids). These may provide short-term relief of symptoms.  Medicines to help control pain, depression, fatigue, or incontinence.  Vitamin D, if you have a deficiency.  Using devices  to help you move around (assistive devices), such as braces, a cane, or a walker.  Physical therapy to strengthen and stretch your muscles.  Occupational therapy to help you with everyday tasks.  Alternative or complementary treatments such as exercise, massage, or acupuncture. Follow these instructions at home:  Take over-the-counter and prescription medicines only as told by your health care provider.  Do not drive or use heavy machinery while taking prescription pain medicine.  Use assistive devices as recommended by your physical therapist or your health care provider.  Exercise as directed by your health care provider.  Return to your normal activities as told by your health care provider. Ask your health care provider what activities are safe for you.  Reach out for support. Share your feelings with friends, family, or a support group.  Keep all follow-up visits as told by your health care provider and therapists. This is important. Where to find more information  National Multiple Sclerosis Society: https://www.nationalmssociety.org Contact a health care provider if:  You feel depressed.  You develop new pain or numbness.  You have tremors.  You have problems with sexual function. Get help right away if:  You develop paralysis.  You develop numbness.  You have problems with your bladder or bowel function.  You develop double vision.  You lose vision in one or both eyes.  You develop suicidal thoughts.  You develop severe confusion. If you ever feel like you may hurt yourself or others, or have thoughts about taking your own life, get help right away. You can go to your nearest emergency department or call:  Your local emergency services (911 in the [place]  A suicide crisis helpline, such as the National Suicide Prevention Lifeline at 8645085685. This is open 24 hours a day. Summary  Multiple sclerosis (MS) is a disease of the central nervous system  that causes the body's immune system to destroy the protective covering (myelin sheath) around nerves in the brain.  There are 3 types of MS: relapsing-remitting, secondary progressive, and primary progressive. Relapsing-remitting and secondary progressive MS cause symptoms to occur in episodes or attacks that may last weeks to months. Primary progressive MS causes symptoms to steadily progress after they develop.  There is no cure for MS, but medicines can help decrease the number and frequency of attacks and help relieve nuisance symptoms. Treatment may also include physical or occupational therapy.  If you develop numbness, paralysis, vision problems, or other neurological symptoms, get help right away. This information is not intended to replace advice given to you by your health care provider. Make sure you discuss any questions you have with your health care provider. Document Revised: 12/25/2016 Document Reviewed: 03/23/2016 Elsevier Patient Education  2020 [employer].

## 2019-10-30 NOTE — Telephone Encounter (Signed)
Pt daughter Elita Quick called and states the patient has been on of Levothyroxine for some time now but levothyroxine (SYNTHROID) 75 MCG tablet Is what was called in. She is requesting a call back. [phone]

## 2019-10-30 NOTE — Telephone Encounter (Signed)
I was under the impression she was taking the 75 mcg, which is what Dr. Fransico Him documented back in June.  That is why I sent in the 75 mcg.  If she was only taking the 25 mcg when she had the labs done this past time, then she should continue on with the 25 mcg, as the labs indicated she was on an appropriate dose.

## 2019-10-30 NOTE — Telephone Encounter (Signed)
Last office note said , please advise.

## 2019-11-06 DIAGNOSIS — E039 Hypothyroidism, unspecified: Secondary | ICD-10-CM | POA: Diagnosis not present

## 2019-11-06 DIAGNOSIS — E782 Mixed hyperlipidemia: Secondary | ICD-10-CM | POA: Diagnosis not present

## 2019-11-06 DIAGNOSIS — R413 Other amnesia: Secondary | ICD-10-CM | POA: Diagnosis not present

## 2019-11-06 DIAGNOSIS — Z794 Long term (current) use of insulin: Secondary | ICD-10-CM | POA: Diagnosis not present

## 2019-11-06 DIAGNOSIS — Z7689 Persons encountering health services in other specified circumstances: Secondary | ICD-10-CM | POA: Diagnosis not present

## 2019-11-06 DIAGNOSIS — I10 Essential (primary) hypertension: Secondary | ICD-10-CM | POA: Diagnosis not present

## 2019-11-06 DIAGNOSIS — E119 Type 2 diabetes mellitus without complications: Secondary | ICD-10-CM | POA: Diagnosis not present

## 2019-11-06 DIAGNOSIS — G35 Multiple sclerosis: Secondary | ICD-10-CM | POA: Diagnosis not present

## 2019-11-18 DIAGNOSIS — Z23 Encounter for immunization: Secondary | ICD-10-CM | POA: Diagnosis not present

## 2019-12-25 DIAGNOSIS — Z79899 Other long term (current) drug therapy: Secondary | ICD-10-CM | POA: Diagnosis not present

## 2019-12-25 DIAGNOSIS — E039 Hypothyroidism, unspecified: Secondary | ICD-10-CM | POA: Diagnosis not present

## 2019-12-25 DIAGNOSIS — I10 Essential (primary) hypertension: Secondary | ICD-10-CM | POA: Diagnosis not present

## 2019-12-25 DIAGNOSIS — G35 Multiple sclerosis: Secondary | ICD-10-CM | POA: Diagnosis not present

## 2019-12-28 DIAGNOSIS — Z7984 Long term (current) use of oral hypoglycemic drugs: Secondary | ICD-10-CM | POA: Diagnosis not present

## 2019-12-28 DIAGNOSIS — E782 Mixed hyperlipidemia: Secondary | ICD-10-CM | POA: Diagnosis not present

## 2019-12-28 DIAGNOSIS — Z794 Long term (current) use of insulin: Secondary | ICD-10-CM | POA: Diagnosis not present

## 2019-12-28 DIAGNOSIS — E039 Hypothyroidism, unspecified: Secondary | ICD-10-CM | POA: Diagnosis not present

## 2019-12-28 DIAGNOSIS — E1140 Type 2 diabetes mellitus with diabetic neuropathy, unspecified: Secondary | ICD-10-CM | POA: Diagnosis not present

## 2019-12-28 DIAGNOSIS — E1165 Type 2 diabetes mellitus with hyperglycemia: Secondary | ICD-10-CM | POA: Diagnosis not present

## 2019-12-28 DIAGNOSIS — Z79899 Other long term (current) drug therapy: Secondary | ICD-10-CM | POA: Diagnosis not present

## 2019-12-29 DIAGNOSIS — Z794 Long term (current) use of insulin: Secondary | ICD-10-CM | POA: Diagnosis not present

## 2019-12-29 DIAGNOSIS — E1140 Type 2 diabetes mellitus with diabetic neuropathy, unspecified: Secondary | ICD-10-CM | POA: Diagnosis not present

## 2020-01-02 DIAGNOSIS — Z1159 Encounter for screening for other viral diseases: Secondary | ICD-10-CM | POA: Diagnosis not present

## 2020-01-02 DIAGNOSIS — Z298 Encounter for other specified prophylactic measures: Secondary | ICD-10-CM | POA: Diagnosis not present

## 2020-01-02 DIAGNOSIS — G35 Multiple sclerosis: Secondary | ICD-10-CM | POA: Diagnosis not present

## 2020-01-02 DIAGNOSIS — Z111 Encounter for screening for respiratory tuberculosis: Secondary | ICD-10-CM | POA: Diagnosis not present

## 2020-01-02 DIAGNOSIS — Z9189 Other specified personal risk factors, not elsewhere classified: Secondary | ICD-10-CM | POA: Diagnosis not present

## 2020-01-02 DIAGNOSIS — G933 Postviral fatigue syndrome: Secondary | ICD-10-CM | POA: Diagnosis not present

## 2020-01-02 DIAGNOSIS — R9082 White matter disease, unspecified: Secondary | ICD-10-CM | POA: Diagnosis not present

## 2020-01-02 DIAGNOSIS — Z79899 Other long term (current) drug therapy: Secondary | ICD-10-CM | POA: Diagnosis not present
